# Patient Record
Sex: Female | Born: 1960 | Race: Black or African American | Hispanic: No | Marital: Single | Smoking: Never smoker
Health system: Southern US, Community
[De-identification: ages and names within clinical notes are randomized; demographics above are authoritative.]

## PROBLEM LIST (undated history)

## (undated) DIAGNOSIS — IMO0001 Reserved for inherently not codable concepts without codable children: Secondary | ICD-10-CM

## (undated) DIAGNOSIS — M109 Gout, unspecified: Secondary | ICD-10-CM

## (undated) DIAGNOSIS — B20 Human immunodeficiency virus [HIV] disease: Secondary | ICD-10-CM

## (undated) DIAGNOSIS — J069 Acute upper respiratory infection, unspecified: Secondary | ICD-10-CM

## (undated) DIAGNOSIS — B1920 Unspecified viral hepatitis C without hepatic coma: Secondary | ICD-10-CM

## (undated) DIAGNOSIS — I10 Essential (primary) hypertension: Secondary | ICD-10-CM

## (undated) DIAGNOSIS — L509 Urticaria, unspecified: Secondary | ICD-10-CM

## (undated) DIAGNOSIS — E785 Hyperlipidemia, unspecified: Secondary | ICD-10-CM

## (undated) DIAGNOSIS — T783XXA Angioneurotic edema, initial encounter: Secondary | ICD-10-CM

## (undated) DIAGNOSIS — B029 Zoster without complications: Secondary | ICD-10-CM

## (undated) DIAGNOSIS — D649 Anemia, unspecified: Secondary | ICD-10-CM

## (undated) DIAGNOSIS — Z8619 Personal history of other infectious and parasitic diseases: Secondary | ICD-10-CM

## (undated) DIAGNOSIS — Z21 Asymptomatic human immunodeficiency virus [HIV] infection status: Secondary | ICD-10-CM

## (undated) HISTORY — DX: Angioneurotic edema, initial encounter: T78.3XXA

## (undated) HISTORY — DX: Unspecified viral hepatitis C without hepatic coma: B19.20

## (undated) HISTORY — DX: Hyperlipidemia, unspecified: E78.5

## (undated) HISTORY — DX: Acute upper respiratory infection, unspecified: J06.9

## (undated) HISTORY — DX: Gout, unspecified: M10.9

## (undated) HISTORY — DX: Essential (primary) hypertension: I10

## (undated) HISTORY — DX: Urticaria, unspecified: L50.9

## (undated) HISTORY — DX: Personal history of other infectious and parasitic diseases: Z86.19

## (undated) HISTORY — DX: Reserved for inherently not codable concepts without codable children: IMO0001

---

## 1997-12-30 ENCOUNTER — Emergency Department (HOSPITAL_COMMUNITY): Admission: EM | Admit: 1997-12-30 | Discharge: 1997-12-30 | Payer: Self-pay | Admitting: Emergency Medicine

## 1998-08-05 ENCOUNTER — Encounter: Payer: Self-pay | Admitting: Emergency Medicine

## 1998-08-05 ENCOUNTER — Emergency Department (HOSPITAL_COMMUNITY): Admission: EM | Admit: 1998-08-05 | Discharge: 1998-08-05 | Payer: Self-pay | Admitting: *Deleted

## 1999-08-10 ENCOUNTER — Emergency Department (HOSPITAL_COMMUNITY): Admission: EM | Admit: 1999-08-10 | Discharge: 1999-08-10 | Payer: Self-pay | Admitting: Emergency Medicine

## 1999-08-10 ENCOUNTER — Encounter: Payer: Self-pay | Admitting: Emergency Medicine

## 2000-12-12 ENCOUNTER — Emergency Department (HOSPITAL_COMMUNITY): Admission: EM | Admit: 2000-12-12 | Discharge: 2000-12-12 | Payer: Self-pay | Admitting: Emergency Medicine

## 2002-04-14 ENCOUNTER — Emergency Department (HOSPITAL_COMMUNITY): Admission: EM | Admit: 2002-04-14 | Discharge: 2002-04-14 | Payer: Self-pay | Admitting: Emergency Medicine

## 2002-04-14 ENCOUNTER — Encounter: Payer: Self-pay | Admitting: Emergency Medicine

## 2002-07-17 ENCOUNTER — Emergency Department (HOSPITAL_COMMUNITY): Admission: EM | Admit: 2002-07-17 | Discharge: 2002-07-17 | Payer: Self-pay | Admitting: Emergency Medicine

## 2003-11-15 ENCOUNTER — Emergency Department (HOSPITAL_COMMUNITY): Admission: EM | Admit: 2003-11-15 | Discharge: 2003-11-15 | Payer: Self-pay | Admitting: Emergency Medicine

## 2006-05-14 ENCOUNTER — Emergency Department (HOSPITAL_COMMUNITY): Admission: EM | Admit: 2006-05-14 | Discharge: 2006-05-14 | Payer: Self-pay | Admitting: Emergency Medicine

## 2006-08-25 ENCOUNTER — Ambulatory Visit: Payer: Self-pay | Admitting: Family Medicine

## 2006-11-01 ENCOUNTER — Emergency Department (HOSPITAL_COMMUNITY): Admission: EM | Admit: 2006-11-01 | Discharge: 2006-11-01 | Payer: Self-pay | Admitting: Emergency Medicine

## 2007-01-29 ENCOUNTER — Ambulatory Visit: Payer: Self-pay | Admitting: Internal Medicine

## 2007-01-29 ENCOUNTER — Ambulatory Visit: Payer: Self-pay | Admitting: *Deleted

## 2007-02-02 ENCOUNTER — Emergency Department (HOSPITAL_COMMUNITY): Admission: EM | Admit: 2007-02-02 | Discharge: 2007-02-02 | Payer: Self-pay | Admitting: *Deleted

## 2007-02-10 ENCOUNTER — Emergency Department (HOSPITAL_COMMUNITY): Admission: EM | Admit: 2007-02-10 | Discharge: 2007-02-10 | Payer: Self-pay | Admitting: Emergency Medicine

## 2007-07-31 ENCOUNTER — Ambulatory Visit: Payer: Self-pay | Admitting: Internal Medicine

## 2007-08-25 ENCOUNTER — Ambulatory Visit: Payer: Self-pay | Admitting: Internal Medicine

## 2008-08-25 ENCOUNTER — Inpatient Hospital Stay (HOSPITAL_COMMUNITY): Admission: EM | Admit: 2008-08-25 | Discharge: 2008-08-29 | Payer: Self-pay | Admitting: Emergency Medicine

## 2008-09-26 ENCOUNTER — Ambulatory Visit: Payer: Self-pay | Admitting: Internal Medicine

## 2008-09-27 ENCOUNTER — Ambulatory Visit (HOSPITAL_COMMUNITY): Admission: RE | Admit: 2008-09-27 | Discharge: 2008-09-27 | Payer: Self-pay | Admitting: Internal Medicine

## 2008-09-29 ENCOUNTER — Ambulatory Visit (HOSPITAL_COMMUNITY): Admission: RE | Admit: 2008-09-29 | Discharge: 2008-09-29 | Payer: Self-pay | Admitting: Internal Medicine

## 2008-10-19 ENCOUNTER — Ambulatory Visit: Payer: Self-pay | Admitting: Internal Medicine

## 2008-10-20 ENCOUNTER — Encounter: Payer: Self-pay | Admitting: Internal Medicine

## 2008-10-20 ENCOUNTER — Ambulatory Visit: Payer: Self-pay | Admitting: Internal Medicine

## 2008-10-20 LAB — CONVERTED CEMR LAB
Chlamydia, Swab/Urine, PCR: NEGATIVE
GC Probe Amp, Urine: NEGATIVE

## 2008-10-21 ENCOUNTER — Ambulatory Visit: Payer: Self-pay | Admitting: Internal Medicine

## 2008-10-28 ENCOUNTER — Encounter (INDEPENDENT_AMBULATORY_CARE_PROVIDER_SITE_OTHER): Payer: Self-pay | Admitting: Adult Health

## 2008-10-28 ENCOUNTER — Ambulatory Visit: Payer: Self-pay | Admitting: Internal Medicine

## 2008-10-28 LAB — CONVERTED CEMR LAB
CD4 T Helper %: 29 % — ABNORMAL LOW (ref 32–62)
HIV 1 RNA Quant: 12700 copies/mL — ABNORMAL HIGH (ref <48)
HIV-1 RNA Quant, Log: 4.1 — ABNORMAL HIGH (ref <1.68)
Hep B S Ab: NEGATIVE
Hepatitis B Surface Ag: NEGATIVE
Total Lymphocytes %: 37 % (ref 12–46)
Total lymphocyte count: 1147 cells/mcL (ref 700–3300)

## 2008-11-03 ENCOUNTER — Encounter: Admission: RE | Admit: 2008-11-03 | Discharge: 2008-11-03 | Payer: Self-pay | Admitting: Internal Medicine

## 2008-11-14 ENCOUNTER — Encounter: Payer: Self-pay | Admitting: Internal Medicine

## 2008-11-14 ENCOUNTER — Ambulatory Visit: Payer: Self-pay | Admitting: Internal Medicine

## 2008-11-14 DIAGNOSIS — J309 Allergic rhinitis, unspecified: Secondary | ICD-10-CM | POA: Insufficient documentation

## 2008-11-14 DIAGNOSIS — B20 Human immunodeficiency virus [HIV] disease: Secondary | ICD-10-CM

## 2008-11-14 DIAGNOSIS — B1710 Acute hepatitis C without hepatic coma: Secondary | ICD-10-CM | POA: Insufficient documentation

## 2008-11-22 ENCOUNTER — Ambulatory Visit: Payer: Self-pay | Admitting: Internal Medicine

## 2008-11-25 ENCOUNTER — Encounter: Payer: Self-pay | Admitting: Internal Medicine

## 2008-12-08 ENCOUNTER — Encounter: Payer: Self-pay | Admitting: Internal Medicine

## 2008-12-23 ENCOUNTER — Encounter: Payer: Self-pay | Admitting: Internal Medicine

## 2009-03-06 ENCOUNTER — Ambulatory Visit: Payer: Self-pay | Admitting: Internal Medicine

## 2009-03-06 ENCOUNTER — Encounter: Payer: Self-pay | Admitting: Internal Medicine

## 2009-03-06 DIAGNOSIS — R3915 Urgency of urination: Secondary | ICD-10-CM | POA: Insufficient documentation

## 2009-03-06 LAB — CONVERTED CEMR LAB
ALT: 130 units/L — ABNORMAL HIGH (ref 0–35)
AST: 167 units/L — ABNORMAL HIGH (ref 0–37)
Albumin: 4.2 g/dL (ref 3.5–5.2)
Alkaline Phosphatase: 165 units/L — ABNORMAL HIGH (ref 39–117)
Blood in Urine, dipstick: NEGATIVE
CD4 T Helper %: 33 % (ref 32–62)
Calcium: 9.3 mg/dL (ref 8.4–10.5)
Chloride: 103 meq/L (ref 96–112)
Lymphocytes Relative: 43 % (ref 12–46)
Lymphs Abs: 1.2 10*3/uL (ref 0.7–4.0)
Neutro Abs: 1.1 10*3/uL — ABNORMAL LOW (ref 1.7–7.7)
Neutrophils Relative %: 40 % — ABNORMAL LOW (ref 43–77)
Platelets: 214 10*3/uL (ref 150–400)
Potassium: 4.5 meq/L (ref 3.5–5.3)
Specific Gravity, Urine: 1.025
WBC: 2.7 10*3/uL — ABNORMAL LOW (ref 4.0–10.5)
pH: 5

## 2009-03-23 ENCOUNTER — Encounter: Payer: Self-pay | Admitting: Internal Medicine

## 2009-03-23 ENCOUNTER — Ambulatory Visit: Payer: Self-pay | Admitting: Internal Medicine

## 2009-03-23 DIAGNOSIS — R21 Rash and other nonspecific skin eruption: Secondary | ICD-10-CM | POA: Insufficient documentation

## 2009-06-05 ENCOUNTER — Encounter: Payer: Self-pay | Admitting: Internal Medicine

## 2009-06-05 ENCOUNTER — Ambulatory Visit: Payer: Self-pay | Admitting: Internal Medicine

## 2009-06-05 LAB — CONVERTED CEMR LAB
ALT: 30 units/L (ref 0–35)
Albumin: 4.7 g/dL (ref 3.5–5.2)
Basophils Relative: 0 % (ref 0–1)
CO2: 21 meq/L (ref 19–32)
Calcium: 9.3 mg/dL (ref 8.4–10.5)
Chloride: 104 meq/L (ref 96–112)
Glucose, Bld: 96 mg/dL (ref 70–99)
HIV 1 RNA Quant: <48 copies/mL (ref <48)
HIV-1 RNA Quant, Log: <1.68 (ref <1.68)
Hemoglobin: 11.9 g/dL — ABNORMAL LOW (ref 12.0–15.0)
Lymphs Abs: 0.9 10*3/uL (ref 0.7–4.0)
MCHC: 32.2 g/dL (ref 30.0–36.0)
MCV: 98.1 fL (ref 78.0–100.0)
Monocytes Absolute: 0.4 10*3/uL (ref 0.1–1.0)
Monocytes Relative: 9 % (ref 3–12)
Neutro Abs: 2.5 10*3/uL (ref 1.7–7.7)
Potassium: 3.9 meq/L (ref 3.5–5.3)
RBC: 3.77 M/uL — ABNORMAL LOW (ref 3.87–5.11)
Sodium: 137 meq/L (ref 135–145)
Total Bilirubin: 0.2 mg/dL — ABNORMAL LOW (ref 0.3–1.2)
Total Protein: 8.2 g/dL (ref 6.0–8.3)
WBC: 3.8 10*3/uL — ABNORMAL LOW (ref 4.0–10.5)

## 2009-06-09 ENCOUNTER — Telehealth: Payer: Self-pay | Admitting: Internal Medicine

## 2009-06-19 ENCOUNTER — Encounter: Payer: Self-pay | Admitting: Internal Medicine

## 2009-06-19 ENCOUNTER — Ambulatory Visit: Payer: Self-pay | Admitting: Internal Medicine

## 2009-06-19 DIAGNOSIS — J019 Acute sinusitis, unspecified: Secondary | ICD-10-CM | POA: Insufficient documentation

## 2009-09-19 ENCOUNTER — Encounter (INDEPENDENT_AMBULATORY_CARE_PROVIDER_SITE_OTHER): Payer: Self-pay | Admitting: *Deleted

## 2009-09-19 ENCOUNTER — Ambulatory Visit: Payer: Self-pay | Admitting: Internal Medicine

## 2009-09-19 LAB — CONVERTED CEMR LAB
ALT: 83 units/L — ABNORMAL HIGH (ref 0–35)
BUN: 6 mg/dL (ref 6–23)
Basophils Absolute: 0 10*3/uL (ref 0.0–0.1)
Cholesterol: 220 mg/dL — ABNORMAL HIGH (ref 0–200)
Creatinine, Ser: 0.72 mg/dL (ref 0.40–1.20)
Eosinophils Absolute: 0 10*3/uL (ref 0.0–0.7)
Eosinophils Relative: 0 % (ref 0–5)
Glucose, Bld: 93 mg/dL (ref 70–99)
HCT: 34.5 % — ABNORMAL LOW (ref 36.0–46.0)
HIV 1 RNA Quant: <48 copies/mL (ref <48)
HIV-1 RNA Quant, Log: <1.68 (ref <1.68)
Hemoglobin: 11.5 g/dL — ABNORMAL LOW (ref 12.0–15.0)
Lymphocytes Relative: 45 % (ref 12–46)
MCHC: 33.3 g/dL (ref 30.0–36.0)
MCV: 93.2 fL (ref 78.0–100.0)
Monocytes Absolute: 0.4 10*3/uL (ref 0.1–1.0)
Platelets: 148 10*3/uL — ABNORMAL LOW (ref 150–400)
Potassium: 3.5 meq/L (ref 3.5–5.3)
RDW: 14.6 % (ref 11.5–15.5)
Total Bilirubin: 0.4 mg/dL (ref 0.3–1.2)
Total Protein: 8.2 g/dL (ref 6.0–8.3)
VLDL: 29 mg/dL (ref 0–40)

## 2009-10-04 ENCOUNTER — Ambulatory Visit: Payer: Self-pay | Admitting: Internal Medicine

## 2009-10-04 DIAGNOSIS — E785 Hyperlipidemia, unspecified: Secondary | ICD-10-CM | POA: Insufficient documentation

## 2009-10-19 ENCOUNTER — Encounter (INDEPENDENT_AMBULATORY_CARE_PROVIDER_SITE_OTHER): Payer: Self-pay | Admitting: *Deleted

## 2009-10-20 ENCOUNTER — Ambulatory Visit: Payer: Self-pay | Admitting: Internal Medicine

## 2009-10-20 ENCOUNTER — Telehealth: Payer: Self-pay | Admitting: Internal Medicine

## 2009-10-20 LAB — CONVERTED CEMR LAB

## 2009-10-31 ENCOUNTER — Encounter: Payer: Self-pay | Admitting: Internal Medicine

## 2009-11-02 ENCOUNTER — Telehealth: Payer: Self-pay | Admitting: Internal Medicine

## 2009-12-27 ENCOUNTER — Encounter: Payer: Self-pay | Admitting: Internal Medicine

## 2010-01-29 ENCOUNTER — Ambulatory Visit: Payer: Self-pay | Admitting: Internal Medicine

## 2010-01-29 LAB — CONVERTED CEMR LAB
HIV 1 RNA Quant: <48 copies/mL (ref <48)
HIV-1 RNA Quant, Log: <1.68 (ref <1.68)

## 2010-01-30 LAB — CONVERTED CEMR LAB
ALT: 31 units/L (ref 0–35)
Basophils Absolute: 0 10*3/uL (ref 0.0–0.1)
CO2: 21 meq/L (ref 19–32)
Calcium: 9 mg/dL (ref 8.4–10.5)
Chloride: 103 meq/L (ref 96–112)
Cholesterol: 221 mg/dL — ABNORMAL HIGH (ref 0–200)
Hemoglobin: 12.4 g/dL (ref 12.0–15.0)
Lymphocytes Relative: 19 % (ref 12–46)
Monocytes Absolute: 0.6 10*3/uL (ref 0.1–1.0)
Neutro Abs: 3.4 10*3/uL (ref 1.7–7.7)
Neutrophils Relative %: 69 % (ref 43–77)
Platelets: 226 10*3/uL (ref 150–400)
Potassium: 4.1 meq/L (ref 3.5–5.3)
RDW: 13.5 % (ref 11.5–15.5)
Sodium: 135 meq/L (ref 135–145)
Total Protein: 7.9 g/dL (ref 6.0–8.3)

## 2010-04-18 ENCOUNTER — Ambulatory Visit: Payer: Self-pay | Admitting: Internal Medicine

## 2010-04-18 LAB — CONVERTED CEMR LAB
ALT: 47 units/L — ABNORMAL HIGH (ref 0–35)
AST: 79 units/L — ABNORMAL HIGH (ref 0–37)
Basophils Absolute: 0 10*3/uL (ref 0.0–0.1)
CO2: 24 meq/L (ref 19–32)
Calcium: 8.6 mg/dL (ref 8.4–10.5)
Chloride: 108 meq/L (ref 96–112)
Eosinophils Absolute: 0 10*3/uL (ref 0.0–0.7)
Eosinophils Relative: 1 % (ref 0–5)
HIV 1 RNA Quant: <20 copies/mL (ref <20)
Lymphs Abs: 1.2 10*3/uL (ref 0.7–4.0)
MCV: 98.9 fL (ref 78.0–100.0)
Neutrophils Relative %: 47 % (ref 43–77)
Platelets: 230 10*3/uL (ref 150–400)
RDW: 14.7 % (ref 11.5–15.5)
Sodium: 142 meq/L (ref 135–145)
Total Bilirubin: 0.3 mg/dL (ref 0.3–1.2)
Total Protein: 7.8 g/dL (ref 6.0–8.3)
WBC: 3 10*3/uL — ABNORMAL LOW (ref 4.0–10.5)

## 2010-05-09 ENCOUNTER — Ambulatory Visit: Payer: Self-pay | Admitting: Internal Medicine

## 2010-06-24 ENCOUNTER — Encounter: Payer: Self-pay | Admitting: Internal Medicine

## 2010-07-03 NOTE — Miscellaneous (Signed)
Summary: Office Visit ([facility])    Vital Signs:  Patient profile:   50 year old female Menstrual status:  regular Weight:      181.04 pounds (82.29 kg) Temp:     98.4 degrees F (36.89 degrees C) oral Pulse rate:   97 / minute Resp:     20 per minute BP sitting:   113 / 77  (left arm) Cuff size:   large  Vitals Entered By: Hoy Morn CMA (June 19, 2009 9:14 AM) CC: pt.presents fu 05//ckf Is Patient Diabetic? No Pain Assessment Patient in pain? no       Does patient need assistance? Functional Status Self care Ambulation Normal   CC:  pt.presents fu 05//ckf.  History of Present Illness: Pt here for f/u.  She continues to have a sore throat and left ear pain.  No fever.  She took all of her amoxicillin. No missed doses of her Atripla.  Current Problems (verified): 1)  Skin Rash  (ICD-782.1) 2)  Urgency of Urination  (YNW-295.62) 3)  Allergic Rhinitis  (ICD-477.9) 4)  HIV Disease  (ICD-042) 5)  Hepatitis C  (ICD-070.51)  Current Medications (verified): 1)  Atripla 600-200-300 Mg Tabs (Efavirenz-Emtricitab-Tenofovir) .... Take 1 Tablet By Mouth At Bedtime 2)  Nasacort Aq 55 Mcg/act Aers (Triamcinolone Acetonide(Nasal)) .... One Spray Two Times A Day 3)  Triamcinolone Acetonide 0.1 % Crea (Triamcinolone Acetonide) .... Apply Twice A Day As Needed 4)  Singulair 10 Mg Tabs (Montelukast Sodium) .... .qdtqab 5)  Ciprofloxacin Hcl 500 Mg Tabs (Ciprofloxacin Hcl) .... Take 1 Tablet By Mouth Two Times A Day  Allergies: No Known Drug Allergies   Social History: Tobacco Use:  yes  Review of Systems  The patient denies anorexia, fever, weight loss, prolonged cough, and hemoptysis.     Physical Exam  General:  alert, well-developed, well-nourished, and well-hydrated.   Head:  normocephalic and atraumatic.   Ears:  left TM dull no erythema Mouth:  no exudates and pharyngeal erythema.   Lungs:  normal breath sounds.      Impression &  Recommendations:  Problem # 1:  HIV DISEASE (ICD-042) Pt.s most recent CD4ct was 328 and VL <48 .  Pt instructed to continue the current antiretroviral regimen.  Pt encouraged to take medication regularly and not miss doses.  Pt will f/u in 3 months for repeat blood work and will see me 2 weeks later.  Diagnostics Reviewed:  HIV: HIV positive - not AIDS (11/14/2008)   WBC: 3.8 (06/05/2009)   Hgb: 11.9 (06/05/2009)   HCT: 37.0 (06/05/2009)   Platelets: 205 (06/05/2009) HIV genotype: See Comment (10/28/2008)   HIV-1 RNA: <48 copies/mL (06/05/2009)   HBSAg: NEG (10/28/2008)  Problem # 2:  ACUTE SINUSITIS, UNSPECIFIED (ICD-461.9) trial of cipro Her updated medication list for this problem includes:    Nasacort Aq 55 Mcg/act Aers (Triamcinolone acetonide(nasal)) ..... One spray two times a day    Ciprofloxacin Hcl 500 Mg Tabs (Ciprofloxacin hcl) .Marland Kitchen... Take 1 tablet by mouth two times a day  Complete Medication List: 1)  Atripla 600-200-300 Mg Tabs (Efavirenz-emtricitab-tenofovir) .... Take 1 tablet by mouth at bedtime 2)  Nasacort Aq 55 Mcg/act Aers (Triamcinolone acetonide(nasal)) .... One spray two times a day 3)  Triamcinolone Acetonide 0.1 % Crea (Triamcinolone acetonide) .... Apply twice a day as needed 4)  Singulair 10 Mg Tabs (Montelukast sodium) .... .qdtqab 5)  Ciprofloxacin Hcl 500 Mg Tabs (Ciprofloxacin hcl) .... Take 1 tablet by mouth two times a day  Other Orders: Est. Patient Level III ([address]) Future Orders: T-CD4SP ([facility]) (CD4SP) ... 09/17/2009 T-HIV Viral Load [phone]) ... 09/17/2009 T-Comprehensive Metabolic Panel [phone]) ... 09/17/2009 T-CBC w/Diff ([id number]) ... 09/17/2009 T-Lipid Profile [phone]) ... 09/17/2009    Patient Instructions: 1)  Please schedule a follow-up appointment in 3 months, 2 weeks after labs. Prescriptions: CIPROFLOXACIN HCL 500 MG TABS (CIPROFLOXACIN HCL) Take 1 tablet by mouth two times a day  #20 x 0   Entered and  Authorized by:   Yisroel Ramming MD   Signed by:   Yisroel Ramming MD on 06/19/2009   Method used:   Print then Give to Patient   RxID:   [id number]

## 2010-07-03 NOTE — Miscellaneous (Signed)
  Clinical Lists Changes  Medications: Added new medication of CLARITIN 10 MG TABS (LORATADINE) Take 1 tablet by mouth once a day - Signed Rx of CLARITIN 10 MG TABS (LORATADINE) Take 1 tablet by mouth once a day;  #30 x 5;  Signed;  Entered by: Yisroel Ramming MD;  Authorized by: Yisroel Ramming MD;  Method used: Print then Give to Patient    Prescriptions: CLARITIN 10 MG TABS (LORATADINE) Take 1 tablet by mouth once a day  #30 x 5   Entered and Authorized by:   Yisroel Ramming MD   Signed by:   Yisroel Ramming MD on 01/29/2010   Method used:   Print then Give to Patient   RxID:   [id number]

## 2010-07-03 NOTE — Assessment & Plan Note (Signed)
Summary: F/U/VS   CC:  follow-up visit, missed labs, and refill Singulair.  History of Present Illness: Pt c/o  of allergy symptoms - would like a refill of her singulair. No missed doses ofher Atripla.  Preventive Screening-Counseling & Management  Alcohol-Tobacco     Alcohol drinks/day: occasional     Smoking Status: never     Passive Smoke Exposure: No  Caffeine-Diet-Exercise     Caffeine use/day: one soda per day     Does Patient Exercise: yes     Type of exercise: walks     Exercise (avg: min/session): 30-60     Times/week: 5  Safety-Violence-Falls     Seat Belt Use: yes      Sexual History:  n/a.        Drug Use:  No.    Comments: pt. given condoms   Updated Prior Medication List: ATRIPLA 600-200-300 MG TABS (EFAVIRENZ-EMTRICITAB-TENOFOVIR) Take 1 tablet by mouth at bedtime NASACORT AQ 55 MCG/ACT AERS (TRIAMCINOLONE ACETONIDE(NASAL)) one spray two times a day TRIAMCINOLONE ACETONIDE 0.1 % CREA (TRIAMCINOLONE ACETONIDE) apply twice a day as needed SINGULAIR 10 MG TABS (MONTELUKAST SODIUM) Take 1 tablet by mouth once a day  Current Allergies (reviewed today): No known allergies  Past History:  Past Medical History: Last updated: 11/14/2008 Hepatitis C HIV disease Allergic rhinitis  Review of Systems  The patient denies anorexia, fever, weight loss, dyspnea on exertion, and prolonged cough.    Vital Signs:  Patient profile:   50 year old female Menstrual status:  regular Height:      67 inches (170.18 cm) Weight:      163.8 pounds (74.45 kg) BMI:     25.75 Temp:     98.0 degrees F (36.67 degrees C) oral Pulse rate:   91 / minute BP sitting:   135 / 88  (right arm)  Vitals Entered By: Wendall Mola CMA Duncan Dull) (January 29, 2010 9:00 AM) CC: follow-up visit, missed labs, refill Singulair Is Patient Diabetic? No Pain Assessment Patient in pain? no      Nutritional Status BMI of 25 - 29 = overweight Nutritional Status Detail  appetite "so-so"  Have you ever been in a relationship where you felt threatened, hurt or afraid?No   Does patient need assistance? Functional Status Self care Ambulation Normal Comments no missed doses of Atripla per patient   Physical Exam  General:  alert, well-developed, well-nourished, and well-hydrated.   Head:  normocephalic and atraumatic.   Mouth:  pharynx pink and moist.   Lungs:  normal breath sounds.      Impression & Recommendations:  Problem # 1:  HIV DISEASE (ICD-042) Will obtain labs today.  Pt to continue her Atripla. F/u in 3months. Diagnostics Reviewed:  HIV: HIV positive - not AIDS (11/14/2008)   CD4: 340 (09/19/2009)   WBC: 2.6 (09/19/2009)   Hgb: 11.5 (09/19/2009)   HCT: 34.5 (09/19/2009)   Platelets: 148 (09/19/2009) HIV genotype: See Comment (10/28/2008)   HIV-1 RNA: <48 copies/mL (09/19/2009)   HBSAg: NEG (10/28/2008)  Problem # 2:  ALLERGIC RHINITIS (ICD-477.9) refill singulair Her updated medication list for this problem includes:    Nasacort Aq 55 Mcg/act Aers (Triamcinolone acetonide(nasal)) ..... One spray two times a day  Other Orders: Est. Patient Level III ([address]) Future Orders: T-CD4SP ([facility]) (CD4SP) ... 04/29/2010 T-HIV Viral Load [phone]) ... 04/29/2010 T-Comprehensive Metabolic Panel [phone]) ... 04/29/2010 T-CBC w/Diff ([id number]) ... 04/29/2010 T-RPR (Syphilis) [phone]) ... 04/29/2010  Patient Instructions: 1)  Please schedule  a follow-up appointment in 3 months, 2 weeks after labs.  Prescriptions: SINGULAIR 10 MG TABS (MONTELUKAST SODIUM) Take 1 tablet by mouth once a day  #90 x 0   Entered and Authorized by:   Yisroel Ramming MD   Signed by:   Yisroel Ramming MD on 01/29/2010   Method used:   Print then Give to Patient   RxID:   [id number]   Appended Document: Orders Update    Clinical Lists Changes  Orders: Added new Service order of Influenza Vaccine NON MCR (86578) -  Signed Observations: Added new observation of FLU VAX#1VIS: 12/25/06 version given January 29, 2010. (01/29/2010 10:38) Added new observation of FLU VAXLOT: 1103 3P (01/29/2010 10:38) Added new observation of FLU VAX EXP: 09/02/2010 (01/29/2010 10:38) Added new observation of FLU VAXBY: Wendall Mola CMA ( AAMA) (01/29/2010 10:38) Added new observation of FLU VAXRTE: IM (01/29/2010 10:38) Added new observation of FLU VAX DSE: 0.5 ml (01/29/2010 10:38) Added new observation of FLU VAXMFR: Novartis (01/29/2010 10:38) Added new observation of FLU VAX SITE: right deltoid (01/29/2010 10:38) Added new observation of FLU VAX: Fluvax Non-MCR (01/29/2010 10:38)       Immunizations Administered:  Influenza Vaccine # 1:    Vaccine Type: Fluvax Non-MCR    Site: right deltoid    Mfr: Novartis    Dose: 0.5 ml    Route: IM    Given by: Wendall Mola CMA ( AAMA)    Exp. Date: 09/02/2010    Lot #: [id number]    VIS given: 12/25/06 version given January 29, 2010.  Flu Vaccine Consent Questions:    Do you have a history of severe allergic reactions to this vaccine? no    Any prior history of allergic reactions to egg and/or gelatin? no    Do you have a sensitivity to the preservative Thimersol? no    Do you have a past history of Guillan-Barre Syndrome? no    Do you currently have an acute febrile illness? no    Have you ever had a severe reaction to latex? no    Vaccine information given and explained to patient? yes    Are you currently pregnant? no

## 2010-07-03 NOTE — Miscellaneous (Signed)
Summary: Problem List update  Clinical Lists Changes  Problems: Added new problem of SCREENING FOR MALIGNANT NEOPLASM OF THE CERVIX (ICD-V76.2) 

## 2010-07-03 NOTE — Progress Notes (Signed)
Summary: Pt assist med arrived 3 mos supply nxt reorder 01/04/10  Phone Note Refill Request      Prescriptions: NASACORT AQ 55 MCG/ACT AERS (TRIAMCINOLONE ACETONIDE(NASAL)) one spray two times a day  #3 btls x 0   Entered by:   Paulo Fruit  BS,CPht II,MPH   Authorized by:   Yisroel Ramming MD   Signed by:   Paulo Fruit  BS,CPht II,MPH on 10/20/2009   Method used:   Samples Given   RxID:   [id number]   Patient Assist Medication Verification: Medication: Nasacort AQ Lot# [id number] Exp Date:06 2012 Tech approval:MLD  Next reorder due August 4. Medication arrived via PAP for a 3 months supply Call placed to patient with message that assistance medications are ready for pick-up. patient said she will pick up on Monday. Paulo Fruit  BS,CPht II,MPH  Oct 20, 2009 2:57 PM

## 2010-07-03 NOTE — Assessment & Plan Note (Signed)
Summary: hse pt. f/u [mkj]   CC:  follow-up visit, lab results, c/o allergies, and needs refills on meds.  History of Present Illness: Pt c/o allergies. She ran out of her medication. She has been taking her Atripla every day.  Preventive Screening-Counseling & Management  Alcohol-Tobacco     Alcohol drinks/day: occasional     Smoking Status: never     Passive Smoke Exposure: No  Caffeine-Diet-Exercise     Caffeine use/day: one soda per day     Does Patient Exercise: yes     Type of exercise: walks     Exercise (avg: min/session): 30-60     Times/week: 5  Safety-Violence-Falls     Seat Belt Use: yes      Sexual History:  n/a.        Drug Use:  No.     Updated Prior Medication List: ATRIPLA 600-200-300 MG TABS (EFAVIRENZ-EMTRICITAB-TENOFOVIR) Take 1 tablet by mouth at bedtime NASACORT AQ 55 MCG/ACT AERS (TRIAMCINOLONE ACETONIDE(NASAL)) one spray two times a day TRIAMCINOLONE ACETONIDE 0.1 % CREA (TRIAMCINOLONE ACETONIDE) apply twice a day as needed SINGULAIR 10 MG TABS (MONTELUKAST SODIUM) .qdtqab  Current Allergies (reviewed today): No known allergies  Past History:  Past Medical History: Last updated: 11/14/2008 Hepatitis C HIV disease Allergic rhinitis  Social History: Sexual History:  n/a  Review of Systems  The patient denies anorexia, fever, and weight loss.    Vital Signs:  Patient profile:   50 year old female Menstrual status:  regular Height:      67 inches (170.18 cm) Weight:      170.8 pounds (77.64 kg) BMI:     26.85 Temp:     98.0 degrees F (36.67 degrees C) oral Pulse rate:   86 / minute BP sitting:   129 / 84  (right arm)  Vitals Entered By: Wendall Mola CMA Duncan Dull) (Oct 04, 2009 9:44 AM) CC: follow-up visit, lab results, c/o allergies, needs refills on meds Is Patient Diabetic? No Pain Assessment Patient in pain? no      Nutritional Status BMI of 25 - 29 = overweight Nutritional Status Detail appetite "good"  Have you  ever been in a relationship where you felt threatened, hurt or afraid?No   Does patient need assistance? Functional Status Self care Ambulation Normal Comments no missed doses of meds per patient   Physical Exam  General:  alert, well-developed, well-nourished, and well-hydrated.   Head:  normocephalic and atraumatic.   Mouth:  pharynx pink and moist.   Lungs:  normal breath sounds.      Impression & Recommendations:  Problem # 1:  HIV DISEASE (ICD-042) Pt.s most recent CD4ct was 340 and VL <48 .  Pt instructed to continue the current antiretroviral regimen.  Pt encouraged to take medication regularly and not miss doses.  Pt will f/u in 3 months for repeat blood work and will see me 2 weeks later. Third Hep B vaccine given.  Will schedule for a PAP.  Diagnostics Reviewed:  HIV: HIV positive - not AIDS (11/14/2008)   CD4: 340 (09/19/2009)   WBC: 2.6 (09/19/2009)   Hgb: 11.5 (09/19/2009)   HCT: 34.5 (09/19/2009)   Platelets: 148 (09/19/2009) HIV genotype: See Comment (10/28/2008)   HIV-1 RNA: <48 copies/mL (09/19/2009)   HBSAg: NEG (10/28/2008)  Problem # 2:  ALLERGIC RHINITIS (ICD-477.9) refill singulair and nasocort Her updated medication list for this problem includes:    Nasacort Aq 55 Mcg/act Aers (Triamcinolone acetonide(nasal)) ..... One spray two  times a day  Problem # 3:  HYPERLIPIDEMIA (ICD-272.4) discussed diet and exercise  Other Orders: Est. Patient Level III (69629) Hepatitis B Vaccine >54yrs (52841) Admin 1st Vaccine (32440) Future Orders: T-CD4SP ([facility]) (CD4SP) ... 01/02/2010 T-HIV Viral Load [phone]) ... 01/02/2010 T-Comprehensive Metabolic Panel [phone]) ... 01/02/2010 T-CBC w/Diff ([id number]) ... 01/02/2010 T-Lipid Profile [id number]) ... 01/02/2010  Patient Instructions: 1)  Please schedule a follow-up appointment in 3 months, 2 weeks after labs. 2)  Schedule in PAP clinic  Prescriptions: SINGULAIR 10 MG TABS (MONTELUKAST SODIUM)  .qdtqab  #30 x 5   Entered and Authorized by:   Yisroel Ramming MD   Signed by:   Yisroel Ramming MD on 10/04/2009   Method used:   Print then Give to Patient   RxID:   [id number] NASACORT AQ 55 MCG/ACT AERS (TRIAMCINOLONE ACETONIDE(NASAL)) one spray two times a day  #1 x 5   Entered and Authorized by:   Yisroel Ramming MD   Signed by:   Yisroel Ramming MD on 10/04/2009   Method used:   Print then Give to Patient   RxID:   [id number]    Immunizations Administered:  Hepatitis B Vaccine # 3:    Vaccine Type: HepB Adult    Site: right deltoid    Mfr: Merck    Dose: 0.5 ml    Route: IM    Given by: Wendall Mola CMA ( AAMA)    Exp. Date: 03/24/2012    Lot #: [id number]    VIS given: 12/18/05 version given Oct 04, 2009.

## 2010-07-03 NOTE — Progress Notes (Signed)
Summary: med refill  Phone Note Refill Request Message from:  Fax from Pharmacy on June 09, 2009 2:56 PM  Refills Requested: Medication #1:  ATRIPLA 600-200-300 MG TABS Take 1 tablet by mouth at bedtime   Dosage confirmed as above?Dosage Confirmed   Brand Name Necessary? No   Supply Requested: 1 month   Last Refilled: 05/10/2009  Method Requested: Telephone to Pharmacy Next Appointment Scheduled: seen 06/05/09. Initial call taken by: Sharen Heck RN,  June 09, 2009 2:57 PM    Prescriptions: ATRIPLA 600-200-300 MG TABS (EFAVIRENZ-EMTRICITAB-TENOFOVIR) Take 1 tablet by mouth at bedtime  #30 x 5   Entered by:   Sharen Heck RN   Authorized by:   Yisroel Ramming MD   Signed by:   Yisroel Ramming MD on 06/09/2009   Method used:   Telephoned to ...       [facility] (retail)       [address]       [place], [place]  [address]       Ph: [phone]       Fax: [phone]   RxID:   [id number]

## 2010-07-03 NOTE — Miscellaneous (Signed)
Summary: HIPAA Restrictions  HIPAA Restrictions   Imported By: Florinda Marker 09/19/2009 15:18:31  _____________________________________________________________________  External Attachment:    Type:   Image     Comment:   External Document

## 2010-07-03 NOTE — Letter (Signed)
Summary: Results Follow-up Letter  [facility]  [address]   [place], [place] [address]   Phone: [phone]  Fax: [phone]          Oct 31, 2009  [address] [place], [place]  [address]  Dear Ms. Barrales,   The following are the results of your recent test(s):  Test     Result     Pap Smear    Normal_XXX___  Not Normal_____       Comments:  I will see you in one year for your next PAP smear.  Thank you for coming to the Center.  Sincerely,    Jennet Maduro [facility]

## 2010-07-03 NOTE — Miscellaneous (Signed)
Summary: Office Visit ([facility])    Vital Signs:  Patient profile:   50 year old female Menstrual status:  regular Weight:      173 pounds Temp:     98.3 degrees F oral Pulse rate:   87 / minute Pulse rhythm:   regular Resp:     17 per minute BP sitting:   120 / 78  (left arm)  Vitals Entered By: Sharen Heck RN (June 05, 2009 8:53 AM) CC: f/u 05, right sided face and left ear pain, cough, migraines daily Pain Assessment Patient in pain? yes     Location: facial Intensity: 9 Type: sharp  Does patient need assistance? Functional Status Self care Ambulation Normal   CC:  f/u 05, right sided face and left ear pain, cough, and migraines daily.  History of Present Illness: Pt c/o allergies with sinus pressure and sore throat. She had a fever a couple of days ago.  She also has swollen glands on the left. She has been taking her Atripla every day.  Preventive Screening-Counseling & Management  Alcohol-Tobacco     Alcohol drinks/day: daily     Smoking Status: never     Passive Smoke Exposure: No  Caffeine-Diet-Exercise     Caffeine use/day: 0     Does Patient Exercise: yes     Type of exercise: walks     Exercise (avg: min/session): 30-60     Times/week: 5  Current Problems (verified): 1)  Skin Rash  (ICD-782.1) 2)  Urgency of Urination  (EAV-409.81) 3)  Allergic Rhinitis  (ICD-477.9) 4)  HIV Disease  (ICD-042) 5)  Hepatitis C  (ICD-070.51)  Current Medications (verified): 1)  Atripla 600-200-300 Mg Tabs (Efavirenz-Emtricitab-Tenofovir) .... Take 1 Tablet By Mouth At Bedtime 2)  Nasacort Aq 55 Mcg/act Aers (Triamcinolone Acetonide(Nasal)) .... One Spray Two Times A Day 3)  Triamcinolone Acetonide 0.1 % Crea (Triamcinolone Acetonide) .... Apply Twice A Day As Needed 4)  Singulair 10 Mg Tabs (Montelukast Sodium) .... .qdtqab 5)  Amoxicillin 500 Mg Caps (Amoxicillin) .... Take 1 Capsule By Mouth Three Times A Day  Allergies (verified): No Known Drug  Allergies   Review of Systems  The patient denies anorexia, prolonged cough, and hemoptysis.     Physical Exam  General:  alert, well-developed, well-nourished, and well-hydrated.   Head:  normocephalic and atraumatic.   Mouth:  no exudates and pharyngeal erythema.   Neck:  cervical lymphadenopathy.   Lungs:  normal breath sounds.      Impression & Recommendations:  Problem # 1:  HIV DISEASE (ICD-042) Will obtain labs today.  Pt to continue current meds and f/u in 2 weeks. Hep B vaccine #2 given today. Diagnostics Reviewed:  HIV: HIV positive - not AIDS (11/14/2008)   WBC: 2.7 (03/06/2009)   Hgb: 11.3 (03/06/2009)   HCT: 34.3 (03/06/2009)   Platelets: 214 (03/06/2009) HIV genotype: See Comment (10/28/2008)   HIV-1 RNA: <48 copies/mL (03/06/2009)   HBSAg: NEG (10/28/2008)  Her updated medication list for this problem includes:    Amoxicillin 500 Mg Caps (Amoxicillin) .Marland Kitchen... Take 1 capsule by mouth three times a day  Problem # 2:  ALLERGIC RHINITIS (ICD-477.9) trial of sinulair. Her updated medication list for this problem includes:    Nasacort Aq 55 Mcg/act Aers (Triamcinolone acetonide(nasal)) ..... One spray two times a day  Medications Added to Medication List This Visit: 1)  Singulair 10 Mg Tabs (Montelukast sodium) .... .qdtqab 2)  Amoxicillin 500 Mg Caps (Amoxicillin) .... Take  1 capsule by mouth three times a day  Other Orders: T-CD4SP [facility]) (CD4SP) T-HIV Viral Load [phone]) T-Comprehensive Metabolic Panel [phone]) T-CBC w/Diff ([id number]) Est. Patient Level III (01027) Hepatitis B Vaccine ADOLESCENT (2 dose) (25366) Admin 1st Vaccine [phone]) Admin 1st Vaccine [facility]) [phone])    Patient Instructions: 1)  Please schedule a follow-up appointment in 2 weeks. Prescriptions: AMOXICILLIN 500 MG CAPS (AMOXICILLIN) Take 1 capsule by mouth three times a day  #30 x 0   Entered and Authorized by:   Yisroel Ramming MD   Signed by:   Yisroel Ramming MD on  06/05/2009   Method used:   Print then Give to Patient   RxID:   [id number] SINGULAIR 10 MG TABS (MONTELUKAST SODIUM) .qdtqab  #30 x 5   Entered and Authorized by:   Yisroel Ramming MD   Signed by:   Yisroel Ramming MD on 06/05/2009   Method used:   Print then Give to Patient   RxID:   [id number]    Hepatitis B Immunization History:    Hep B # 2:  HepB Adolescent (06/05/2009)  Hepatitis B Vaccine # 2    Vaccine Type: HepB Adolescent    Site: left deltoid    Mfr: GlaxoSmithKline    Dose: 0.5 ml    Route: IM    Given by: Sharen Heck RN    Lot #: [id number]    VIS given: 12/18/05 version given June 05, 2009.

## 2010-07-03 NOTE — Miscellaneous (Signed)
Summary: clinical update/ryan white  Clinical Lists Changes  Observations: Added new observation of PAYOR: Medicaid (09/19/2009 9:43) Added new observation of AIDSDAP: Yes 2011 (09/19/2009 9:43) Added new observation of FINASSESSDT: 09/19/2009 (09/19/2009 9:43) Added new observation of REC_MESSAGE: Yes (09/19/2009 9:43) Added new observation of RECPHONECALL: Yes (09/19/2009 9:43) Added new observation of REC_MAIL: Yes (09/19/2009 9:43)  Appended Document: clinical update/ryan white the Medicaid only covers Family Planning not medications.

## 2010-07-03 NOTE — Assessment & Plan Note (Signed)
Summary: PAP smear visit   Vital Signs:  Patient profile:   50 year old female Menstrual status:  regular LMP:     10/09/2009  Vitals Entered By: Jennet Maduro RN (Oct 20, 2009 8:51 AM) CC: PAP smear visit.  Pt. given condoms.  Pt. given educational materials re:   HIV and women, BSE, nutrition, diet, exercise and self-esteem.   Pt. given information about mammograms.  LMP (date): 10/09/2009 LMP - Character: normal     Menstrual Status regular Enter LMP: 10/09/2009   Evaluation and Follow-Up  Prevention For Positives: 10/20/2009   Safe sex practices discussed with patient. Condoms offered. Prior Medications: ATRIPLA 600-200-300 MG TABS (EFAVIRENZ-EMTRICITAB-TENOFOVIR) Take 1 tablet by mouth at bedtime NASACORT AQ 55 MCG/ACT AERS (TRIAMCINOLONE ACETONIDE(NASAL)) one spray two times a day TRIAMCINOLONE ACETONIDE 0.1 % CREA (TRIAMCINOLONE ACETONIDE) apply twice a day as needed SINGULAIR 10 MG TABS (MONTELUKAST SODIUM) .qdtqab Current Allergies: No known allergies  Orders Added: 1)  T-PAP [facility]) [88142] 2)  Est. Patient Level I [02725]           Prevention For Positives: 10/20/2009   Safe sex practices discussed with patient. Condoms offered.                           Appended Document: PAP smear visit, Juanell Fairly Flowsheet updated

## 2010-07-03 NOTE — Progress Notes (Signed)
Summary: Pt assist med arrived via PAP nxtrfl 01/05/10  Phone Note Refill Request      New/Updated Medications: SINGULAIR 10 MG TABS (MONTELUKAST SODIUM) Take 1 tablet by mouth once a day Prescriptions: SINGULAIR 10 MG TABS (MONTELUKAST SODIUM) Take 1 tablet by mouth once a day  #90 x 0   Entered by:   Paulo Fruit  BS,CPht II,MPH   Authorized by:   Yisroel Ramming MD   Signed by:   Paulo Fruit  BS,CPht II,MPH on 11/02/2009   Method used:   Samples Given   RxID:   [id number]  Patient Assist Medication Verification: Medication name: Singulair 10mg  RX # [id number] Tech approva:MLD Call placed to patient with message that assistance medications are ready for pick-up. Paulo Fruit  BS,CPht II,MPH  November 02, 2009 11:08 AM

## 2010-07-03 NOTE — Miscellaneous (Signed)
Summary: RW Update  Clinical Lists Changes  Observations: Added new observation of DATE1STVISIT: 10/04/2009 (12/27/2009 12:06) Added new observation of RWPARTICIP: Yes (12/27/2009 12:06)

## 2010-07-19 ENCOUNTER — Encounter (INDEPENDENT_AMBULATORY_CARE_PROVIDER_SITE_OTHER): Payer: Self-pay | Admitting: *Deleted

## 2010-07-25 NOTE — Miscellaneous (Addendum)
Summary: RW Financial Update  Clinical Lists Changes  Observations: Added new observation of PCTFPL: 0  (07/19/2010 16:19) Added new observation of #CHILD<18 IN: No  (07/19/2010 16:19) Added new observation of AIDSDAP: No  (07/19/2010 16:19)

## 2010-08-16 ENCOUNTER — Other Ambulatory Visit (INDEPENDENT_AMBULATORY_CARE_PROVIDER_SITE_OTHER): Payer: Medicaid Other

## 2010-08-16 ENCOUNTER — Other Ambulatory Visit: Payer: Self-pay | Admitting: Adult Health

## 2010-08-16 ENCOUNTER — Encounter: Payer: Self-pay | Admitting: *Deleted

## 2010-08-16 ENCOUNTER — Encounter: Payer: Self-pay | Admitting: Adult Health

## 2010-08-16 DIAGNOSIS — B20 Human immunodeficiency virus [HIV] disease: Secondary | ICD-10-CM

## 2010-08-16 LAB — CONVERTED CEMR LAB
ALT: 24 units/L (ref 0–35)
AST: 43 units/L — ABNORMAL HIGH (ref 0–37)
Basophils Absolute: 0 10*3/uL (ref 0.0–0.1)
Calcium: 9 mg/dL (ref 8.4–10.5)
Chloride: 106 meq/L (ref 96–112)
Creatinine, Ser: 0.84 mg/dL (ref 0.40–1.20)
Eosinophils Absolute: 0 10*3/uL (ref 0.0–0.7)
Eosinophils Relative: 1 % (ref 0–5)
HCT: 32.8 % — ABNORMAL LOW (ref 36.0–46.0)
HIV 1 RNA Quant: <20 copies/mL (ref <20)
HIV-1 RNA Quant, Log: <1.3 (ref <1.30)
MCV: 102.2 fL — ABNORMAL HIGH (ref 78.0–100.0)
Platelets: 168 10*3/uL (ref 150–400)
RDW: 14.7 % (ref 11.5–15.5)
Sodium: 140 meq/L (ref 135–145)
Total Bilirubin: 0.4 mg/dL (ref 0.3–1.2)

## 2010-08-17 LAB — T-HELPER CELL (CD4) - (RCID CLINIC ONLY): CD4 % Helper T Cell: 36 % (ref 33–55)

## 2010-08-21 LAB — T-HELPER CELL (CD4) - (RCID CLINIC ONLY)
CD4 % Helper T Cell: 30 % — ABNORMAL LOW (ref 33–55)
CD4 T Cell Abs: 340 uL — ABNORMAL LOW (ref 400–2700)

## 2010-08-21 NOTE — Miscellaneous (Signed)
Summary: lab orders  Clinical Lists Changes  Orders: Added new Test order of T-CBC w/Diff [id number]) - Signed Added new Test order of T-CD4SP [facility]) (CD4SP) - Signed Added new Test order of T-Comprehensive Metabolic Panel [phone]) - Signed Added new Test order of T-HIV Viral Load [phone]) - Signed    pt is here for labs. entered under B. Sundra Aland, NP.Marland KitchenMarland KitchenGolden Circle RN  August 16, 2010 9:02 AM

## 2010-08-30 ENCOUNTER — Ambulatory Visit (INDEPENDENT_AMBULATORY_CARE_PROVIDER_SITE_OTHER): Payer: Medicaid Other | Admitting: Adult Health

## 2010-08-30 ENCOUNTER — Encounter: Payer: Self-pay | Admitting: Adult Health

## 2010-08-30 VITALS — BP 116/77 | HR 88 | Temp 98.1°F | Ht 67.0 in | Wt 150.0 lb

## 2010-08-30 DIAGNOSIS — E785 Hyperlipidemia, unspecified: Secondary | ICD-10-CM

## 2010-08-30 DIAGNOSIS — D649 Anemia, unspecified: Secondary | ICD-10-CM

## 2010-08-30 DIAGNOSIS — B20 Human immunodeficiency virus [HIV] disease: Secondary | ICD-10-CM

## 2010-08-30 LAB — FOLATE: Folate: 6.1 ng/mL

## 2010-08-30 LAB — VITAMIN B12: Vitamin B-12: 349 pg/mL (ref 211–911)

## 2010-08-30 NOTE — Progress Notes (Signed)
  Subjective:    Patient ID: Barbara Henry, female    DOB: 1961-04-04, 50 y.o.   MRN: [id number]  HPI In for f/u on labs.  Doing well on Artripla with good tolerance.  Voices no complaints.  allergies well-controlled ion meds.   Review of Systems  Constitutional: Negative.   HENT: Negative.   Eyes: Negative.   Respiratory: Negative.   Cardiovascular: Negative.   Gastrointestinal: Negative.   Genitourinary: Negative.   Musculoskeletal: Negative.   Skin: Negative.   Neurological: Negative.   Hematological: Negative.   Psychiatric/Behavioral: Negative.        Objective:   Physical Exam  Constitutional: She is oriented to person, place, and time. She appears well-developed and well-nourished. No distress.       Aromatic odor to breath, but no slurring speech or oddity in behavior.  HENT:  Head: Normocephalic and atraumatic.  Right Ear: External ear normal.  Left Ear: External ear normal.  Nose: Nose normal.  Mouth/Throat: Oropharynx is clear and moist. No oropharyngeal exudate.  Eyes: Conjunctivae and EOM are normal. Pupils are equal, round, and reactive to light. Right eye exhibits no discharge. Left eye exhibits no discharge. No scleral icterus.  Neck: Normal range of motion. Neck supple. No JVD present. No tracheal deviation present. No thyromegaly present.  Cardiovascular: Normal rate, regular rhythm, normal heart sounds and intact distal pulses.  Exam reveals no gallop and no friction rub.   No murmur heard. Pulmonary/Chest: Effort normal and breath sounds normal. No respiratory distress. She has no wheezes. She has no rales. She exhibits no tenderness.  Abdominal: Soft. Bowel sounds are normal. She exhibits no distension and no mass. There is no tenderness. There is no rebound and no guarding.  Musculoskeletal: Normal range of motion. She exhibits no edema and no tenderness.  Lymphadenopathy:    She has no cervical adenopathy.  Neurological: She is alert and oriented to  person, place, and time. She has normal reflexes. She displays normal reflexes. No cranial nerve deficit. She exhibits normal muscle tone. Coordination normal.  Skin: Skin is warm and dry. No rash noted. She is not diaphoretic. No erythema. No pallor.  Psychiatric: She has a normal mood and affect. Her behavior is normal. Judgment and thought content normal.          Assessment & Plan:  HIV:  CD4 250 @ 36% with VL <20 copies/ml  CPM for now.  Repeat labs 10 weeks with f/u in 3 months.  Anemia:  Hgb dropped to 10.9 and Hct 32.0  Will obtain B12, Folate, Iron & TIBC today.  If values continue to decline, an assessment for occult blood loss should be made.

## 2010-09-13 LAB — CBC
HCT: 31.6 % — ABNORMAL LOW (ref 36.0–46.0)
HCT: 31.9 % — ABNORMAL LOW (ref 36.0–46.0)
HCT: 34 % — ABNORMAL LOW (ref 36.0–46.0)
HCT: 35.1 % — ABNORMAL LOW (ref 36.0–46.0)
Hemoglobin: 10.9 g/dL — ABNORMAL LOW (ref 12.0–15.0)
Hemoglobin: 10.9 g/dL — ABNORMAL LOW (ref 12.0–15.0)
Hemoglobin: 11.7 g/dL — ABNORMAL LOW (ref 12.0–15.0)
Hemoglobin: 12 g/dL (ref 12.0–15.0)
Hemoglobin: 12 g/dL (ref 12.0–15.0)
MCHC: 34.1 g/dL (ref 30.0–36.0)
MCHC: 34.2 g/dL (ref 30.0–36.0)
MCHC: 34.4 g/dL (ref 30.0–36.0)
MCHC: 34.6 g/dL (ref 30.0–36.0)
MCV: 92.8 fL (ref 78.0–100.0)
MCV: 93 fL (ref 78.0–100.0)
MCV: 93.4 fL (ref 78.0–100.0)
MCV: 93.8 fL (ref 78.0–100.0)
Platelets: 166 10*3/uL (ref 150–400)
Platelets: 168 10*3/uL (ref 150–400)
Platelets: 169 10*3/uL (ref 150–400)
Platelets: 182 10*3/uL (ref 150–400)
Platelets: 190 10*3/uL (ref 150–400)
RBC: 3.4 MIL/uL — ABNORMAL LOW (ref 3.87–5.11)
RBC: 3.41 MIL/uL — ABNORMAL LOW (ref 3.87–5.11)
RBC: 3.66 MIL/uL — ABNORMAL LOW (ref 3.87–5.11)
RBC: 3.76 MIL/uL — ABNORMAL LOW (ref 3.87–5.11)
RDW: 13.5 % (ref 11.5–15.5)
RDW: 13.5 % (ref 11.5–15.5)
RDW: 13.7 % (ref 11.5–15.5)
RDW: 13.7 % (ref 11.5–15.5)
RDW: 13.8 % (ref 11.5–15.5)
WBC: 3.6 10*3/uL — ABNORMAL LOW (ref 4.0–10.5)
WBC: 3.7 10*3/uL — ABNORMAL LOW (ref 4.0–10.5)
WBC: 3.7 10*3/uL — ABNORMAL LOW (ref 4.0–10.5)
WBC: 4.3 10*3/uL (ref 4.0–10.5)
WBC: 5.8 10*3/uL (ref 4.0–10.5)

## 2010-09-13 LAB — BASIC METABOLIC PANEL
BUN: 10 mg/dL (ref 6–23)
Chloride: 103 mEq/L (ref 96–112)
Creatinine, Ser: 0.72 mg/dL (ref 0.4–1.2)
Glucose, Bld: 101 mg/dL — ABNORMAL HIGH (ref 70–99)
Potassium: 3.8 mEq/L (ref 3.5–5.1)

## 2010-09-13 LAB — HEMOGLOBIN AND HEMATOCRIT, BLOOD
HCT: 36.9 % (ref 36.0–46.0)
HCT: 37.1 % (ref 36.0–46.0)
Hemoglobin: 12.3 g/dL (ref 12.0–15.0)
Hemoglobin: 12.4 g/dL (ref 12.0–15.0)

## 2010-09-13 LAB — COMPREHENSIVE METABOLIC PANEL
Albumin: 3.1 g/dL — ABNORMAL LOW (ref 3.5–5.2)
Alkaline Phosphatase: 92 U/L (ref 39–117)
BUN: 6 mg/dL (ref 6–23)
CO2: 27 mEq/L (ref 19–32)
Chloride: 106 mEq/L (ref 96–112)
GFR calc non Af Amer: >60 mL/min (ref >60)
Potassium: 3.7 mEq/L (ref 3.5–5.1)
Total Bilirubin: 0.9 mg/dL (ref 0.3–1.2)

## 2010-09-13 LAB — PROTIME-INR
INR: 1 (ref 0.00–1.49)
Prothrombin Time: 13.1 seconds (ref 11.6–15.2)

## 2010-09-13 LAB — ABO/RH: ABO/RH(D): O POS

## 2010-09-13 LAB — AMMONIA: Ammonia: 44 umol/L — ABNORMAL HIGH (ref 11–35)

## 2010-09-13 LAB — POCT PREGNANCY, URINE: Preg Test, Ur: NEGATIVE

## 2010-09-21 ENCOUNTER — Other Ambulatory Visit (HOSPITAL_COMMUNITY)
Admission: RE | Admit: 2010-09-21 | Discharge: 2010-09-21 | Disposition: A | Discharge status: Home / Self Care | Payer: Medicaid Other | Source: Ambulatory Visit | Attending: Infectious Diseases | Admitting: Infectious Diseases

## 2010-09-21 ENCOUNTER — Ambulatory Visit (INDEPENDENT_AMBULATORY_CARE_PROVIDER_SITE_OTHER): Payer: Medicaid Other | Admitting: *Deleted

## 2010-09-21 DIAGNOSIS — Z01419 Encounter for gynecological examination (general) (routine) without abnormal findings: Secondary | ICD-10-CM | POA: Insufficient documentation

## 2010-09-21 DIAGNOSIS — Z124 Encounter for screening for malignant neoplasm of cervix: Secondary | ICD-10-CM

## 2010-09-21 NOTE — Patient Instructions (Signed)
  Place pap smear patient instructions here.   Your results will be ready in approximately 1 week.  I will mail them to you

## 2010-09-28 ENCOUNTER — Encounter: Payer: Self-pay | Admitting: *Deleted

## 2010-10-16 NOTE — Discharge Summary (Signed)
Barbara Henry, TOUTANT NO.:  [id number]   MEDICAL RECORD NO.:  [id number]          PATIENT TYPE:  INP   LOCATION:  5121                         FACILITY:  [facility]   PHYSICIAN:  Cherylynn Ridges, M.D.    DATE OF BIRTH:  02/14/1961   DATE OF ADMISSION:  08/25/2008  DATE OF DISCHARGE:  08/29/2008                               DISCHARGE SUMMARY   ADMITTING TRAUMA SURGEON:  Lennie Muckle, MD   CONSULTANTS:  1. Almedia Balls. Ranell Patrick, MD, Orthopedic Surgery.  2. Antonietta Breach, MD, Psychiatry.   DISCHARGE DIAGNOSES:  1. Fall from a porch.  2. Retroperitoneal hematoma.  3. Flank contusion.  4. Facial laceration.  5. Facial contusion.  6. 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  7. Concussion.  8. Ethyl alcohol abuse, chronic.  9. Marijuana abuse, chronic.   HISTORY ON ADMISSION:  This is a 50 year old female who reports that she  blacked out when standing on a porch and he may have fallen.  She was  complaining of back pain and a headache.   Workup in the ED at this time including plain chest x-ray and plain  pelvic films were both negative.  Right knee plain films showed a high-  riding patella.  Head CT scan was without acute intracranial  abnormality.  CT scan of the cervical spine was also negative.  Abdominal and pelvic CT scan showed a small retroperitoneal hematoma  approximately 2.5 x 3.1 cm, and the patient did have a corresponding  right flank contusion noted.   The patient was obviously intoxicated and was admitted for pain control  mobilization, further evaluation per Orthopedic Surgery.  Dr. Malon Kindle saw the patient in consultation and felt that her injury to her  patellar tendon was likely old, but she did have some increased pain in  this knee and she was placed in a knee immobilizer and maintained  weightbearing as tolerated.  She was going to follow up with Dr. Ranell Patrick  in approximately 2 weeks.   Concussion, mild with facial lacerations contusions.  The patient did  well with regards to these and no further followup was needed.   Retroperitoneal hematoma.  The patient did have a slight decline in her  hemoglobin and hematocrit following her admission, but this has since  stabilized and normalized with a hemoglobin of 12.0, hematocrit of 35.6  at the time of discharge, platelets 169,000, white blood cell count of  3.6 at the time of discharge.  The patient is afebrile.  She has no  abdominal pain, minimal complaints of back pain.  She is taking Percocet  intermittently for relief of this.   Chronic alcohol abuse.  The patient did undergo alcohol and substance  abuse screening by our social worker and her scores did indicate as it  is drinking levels.  The patient, however, initially declined alcohol  treatment.  Dr. Jeanie Sewer was consulted, and the patient was interested  in alcohol treatment.  She has been given information on the 12-step  method and  information on how to contact outpatient treatment centers.  The patient does desire discharge at this time.  She is ambulatory for  long distances with only loose supervision, which is not needed at this  time of discharge.  She is also tolerating regular diet.   MEDICATIONS AT THE TIME OF DISCHARGE:  Percocet 5/325 mg 1-2 p.o. q.4 h.  p.r.n. pain, #60, no refill.   Again, she will follow up with Dr. Ranell Patrick in 2 weeks.  She will call  Trauma Services as needed for question.  She does need to follow up with  alcohol treatment program as instructed by social work and Psychiatry.      Shawn Rayburn, P.A.      Cherylynn Ridges, M.D.  Electronically Signed    SR/MEDQ  D:  08/29/2008  T:  08/30/2008  Job:  [id number]   cc:   Almedia Balls. Ranell Patrick, M.D.  [facility]

## 2010-10-16 NOTE — Consult Note (Signed)
NAMEEARNEST, THALMAN NO.:  [id number]   MEDICAL RECORD NO.:  [id number]          PATIENT TYPE:  INP   LOCATION:  5121                         FACILITY:  [facility]   PHYSICIAN:  Antonietta Breach, M.D.  DATE OF BIRTH:  02-23-1961   DATE OF CONSULTATION:  08/26/2008  DATE OF DISCHARGE:                                 CONSULTATION   REQUESTING PHYSICIAN:  Jimmye Norman.   REASON FOR CONSULTATION:  Alcohol dependence, evaluate, recommend  treatment.   HISTORY OF PRESENT ILLNESS:  Barbara Henry is a 50 year old female  admitted to the [facility] on August 25, 2008, due to a severe  hematoma and fall.   She was alcohol intoxicated with a blood level of 402 by the time she  arrived to the emergency room.  She had fallen and she was suffering  from a retroperitoneal hematoma.   She denies any thoughts of harming herself or others.  Her mood and  interests are within normal limits.  She has constructive future goals.  She is not having any hallucinations or delusions.  She is socially  appropriate and cooperative.   She denies drinking alcohol every day; however, she is on the Ativan  withdrawal protocol and is tolerating it well.  She is not demonstrating  any tremor or sweats.  She states that she is motivated for alcohol  psychosocial rehab.   PAST PSYCHIATRIC HISTORY:  She denies any history of major depression,  use of illegal drugs, or any form of mental treatment.   She states that she has heard of the 12-step program but has never  participated in it.   FAMILY PSYCHIATRIC HISTORY:  None known.   SOCIAL HISTORY:  She has been dating her boyfriend for 4 years.  They  did have an argument prior to the patient blacking out; however, they  are continuing in a mutually supportive relationship.  He is visiting  her in the hospital today providing support.   The patient stated that she did not want to pursue treatment with the  clinical social worker; however,  she has changed her mind with the  undersigned.   She told the admitting physician that she uses marijuana.   PAST MEDICAL HISTORY:  1. Retroperitoneal hematoma.  2. Seasonal allergies.   MEDICATIONS:  She is on the Ativan withdrawal protocol.   SHE HAS NO KNOWN DRUG ALLERGIES.   REVIEW OF SYSTEMS:  Noncontributory.   EXAMINATION:  Bilateral hand extension, no tremor.  VITAL SIGNS:  Temperature 98.7.  Pulse 95.  Respiratory rate 18.  Blood  pressure 134/82.  O2 saturation on room air 100%.   MENTAL STATUS EXAM:  Barbara Henry is alert.  Her eye contact is good.  She  does have significant swelling with the left eyelids and orbit.  She has  intact attention and concentration.  Her affect is broad and  appropriate.  Her mood is within normal limits.  Her orientation is  intact to all spheres.  Her memory is intact to immediate, recent, and  remote except for the blackout.  Her speech involves  normal rate and  without dysarthria.  Thought process is logical, coherent, goal  directed.  No looseness of associations.  Thought content, no thoughts  of harming herself or others.  No delusions or hallucinations.  Her  insight is intact for the severity of her alcohol problem.  She states  that she is motivated for treatment.   Judgment is intact.   ASSESSMENT:  1. Alcohol dependence.  2. Cannabis abuse.  AXIS II:  Deferred.  AXIS III:  See past medical history.  AXIS IV:  Primary support group.  AXIS V:  55.   Barbara Henry is not at risk to harm herself or others.  She agrees to call  emergency services immediately for any thoughts of harming herself or  others or distress.   The patient requested that her boyfriend remain in the room to  facilitate education and support.   The undersigned provided information regarding the 12-step method, as  well as available alcohol rehabilitation centers.   Barbara Henry states that she would like some 12-step materials and that  she would like  to be set up with a chemical dependence rehabilitation  program.   Once the Ativan taper is done, would continue thiamine indefinitely.   Would ask the social worker to set Barbara Henry up with a [facility]  appointment and provide 12-step materials.      Antonietta Breach, M.D.  Electronically Signed     JW/MEDQ  D:  08/26/2008  T:  08/26/2008  Job:  [id number]

## 2010-10-16 NOTE — H&P (Signed)
NAMEVERLINDA, SLOTNICK NO.:  [id number]   MEDICAL RECORD NO.:  [id number]          PATIENT TYPE:  INP   LOCATION:  5121                         FACILITY:  [facility]   PHYSICIAN:  Lennie Muckle, MD      DATE OF BIRTH:  1960-09-05   DATE OF ADMISSION:  08/25/2008  DATE OF DISCHARGE:                              HISTORY & PHYSICAL   Ms. Eley is a 50 year old female who reports blacking out.  Apparently, she was arguing with her boyfriend and states she had a  headache.  She then does not recall any events but wakes up and  complains of headache.  She was reported to have fallen approximately 4  feet by EMS.  She does currently have back pain and continues to have  headache.   PAST MEDICAL HISTORY:  Seasonal allergies.   SURGICAL HISTORY:  Negative.   SOCIAL HISTORY:  She does have tobacco use.  Is a heavy drinker.  She  drinks approximately 2-3 times a week and does use marijuana and no  tobacco.   OVER-THE-COUNTER MEDICATIONS:  For her seasonal allergies.   REVIEW OF SYSTEMS:  Negative.   PHYSICAL EXAMINATION:  VITAL SIGNS:  Temperature 97.7, pulse 91, blood  pressure 150/91, O2 sat 100% on room air.  SKIN:  She has a small 2-cm laceration which was repaired by the  emergency department.  HEENT:  She does have some mild swelling and ecchymosis on the left  frontal parietal area, some mild abrasion on the left cheek, she has  some tenderness over the frontal sinus as well as the ethmoid area.  Dentition is poor but there is no instability.  Extraocular muscles are  intact.  Pupils are equal, round, and reactive to light.  Tympanic  membranes are clear bilaterally.  NECK:  Without tenderness.  LUNGS:  Clear to auscultation bilaterally.  CARDIOVASCULAR:  Regular rate and rhythm.  ABDOMEN:  She has some mild tenderness in the left lower quadrant.  Pelvis is stable.  MUSCULOSKELETAL:  She has an abrasion on her right knee with some mild  swelling, some pain  with palpation, she also has abrasion on her left  hand.  Back she has some tenderness in the left thoracic lumbar region  but no abrasions are noted.  No step-offs. NEUROLOGIC:  Grossly intact.  She moves all four extremities is symmetric on examination.   LABORATORY DATA:  BUN and creatinine 10 and 0.7, hemoglobin 12.7,  hematocrit 37, alcohol level is 402.  Chest x-ray is negative.  Pelvis  negative.  Right knee there is a question of a high-riding patella.  CT  of the head is negative.  CT of the C-spine negative.  Abdomen and  pelvis there is a question of a small hematoma retroperitoneal near the  gastrohepatic ligament.  There is also an abrasion on the right flank.  No free fluid is noted.  No abdominal organ injury is seen.   ASSESSMENT AND PLAN:  Status post fall with:  1. Retroperitoneal hematoma.  2. Flank abrasion.  3. Question of patellar injury.  Admit the patient with repeat hemoglobin and hematocrit now and in the  morning.  Bed rest for now.  I will place SCDs for deep vein thrombosis  prophylaxis.  Hold anticoagulants until repeat hemoglobin/hematocrit.  I  have called Dr. Ranell Patrick with Orthopedics for question of patellar injury.  He will evaluate the patient for further imaging and/or treatment is  needed.  She does have some back tenderness but no frank abnormalities  seen on CT imaging of the T and L-spine.  Will clinically clear when she  is over both T, L and C spine.  We will maintain [place] J for now.  Gastrointestinal prophylaxis with Protonix.  We will allow clear liquids  overnight and then readdress in the morning.      Lennie Muckle, MD  Electronically Signed     ALA/MEDQ  D:  08/25/2008  T:  08/26/2008  Job:  [id number]

## 2010-10-21 IMAGING — MG MM DIGITAL SCREENING BILAT
4 series · 4 of 4 positions shown · non-contrast
Comparison: none

DG SCREEN MAMMOGRAM BILATERAL
Bilateral CC and MLO view(s) were taken.
Technologist: Wanishi, Jyunji.(ARMIEN)(M)

DIGITAL SCREENING MAMMOGRAM WITH CAD:
There are scattered fibroglandular densities.  Asymmetry and possible masses are noted in the left 
breast.  Spot compression views and possibly sonography are recommended for further evaluation.  In
the right breast, no masses or malignant type calcifications are identified.

[R CC]
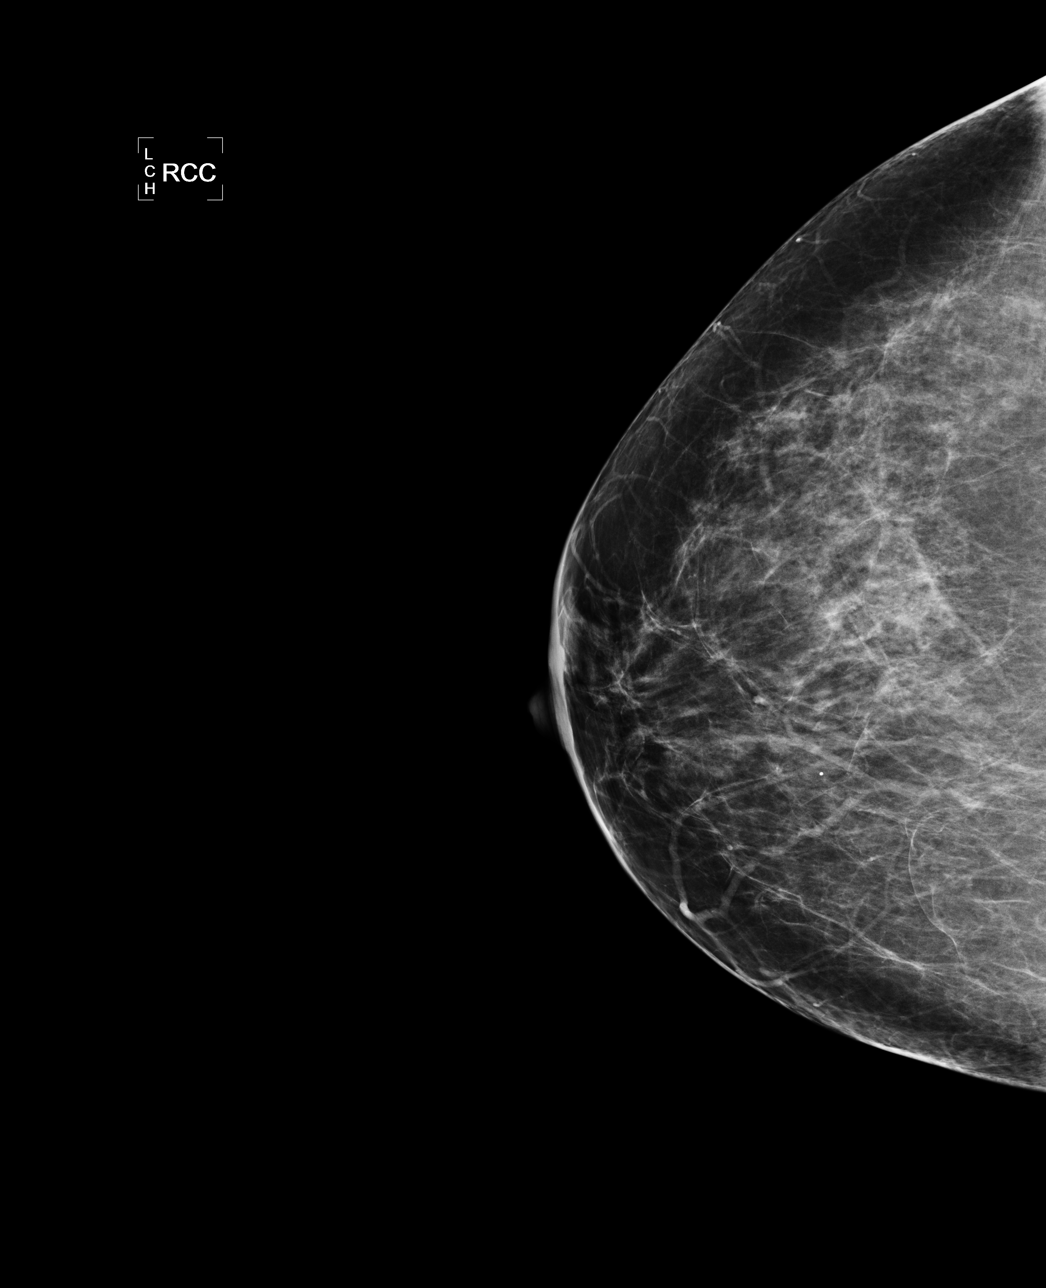

[R MLO]
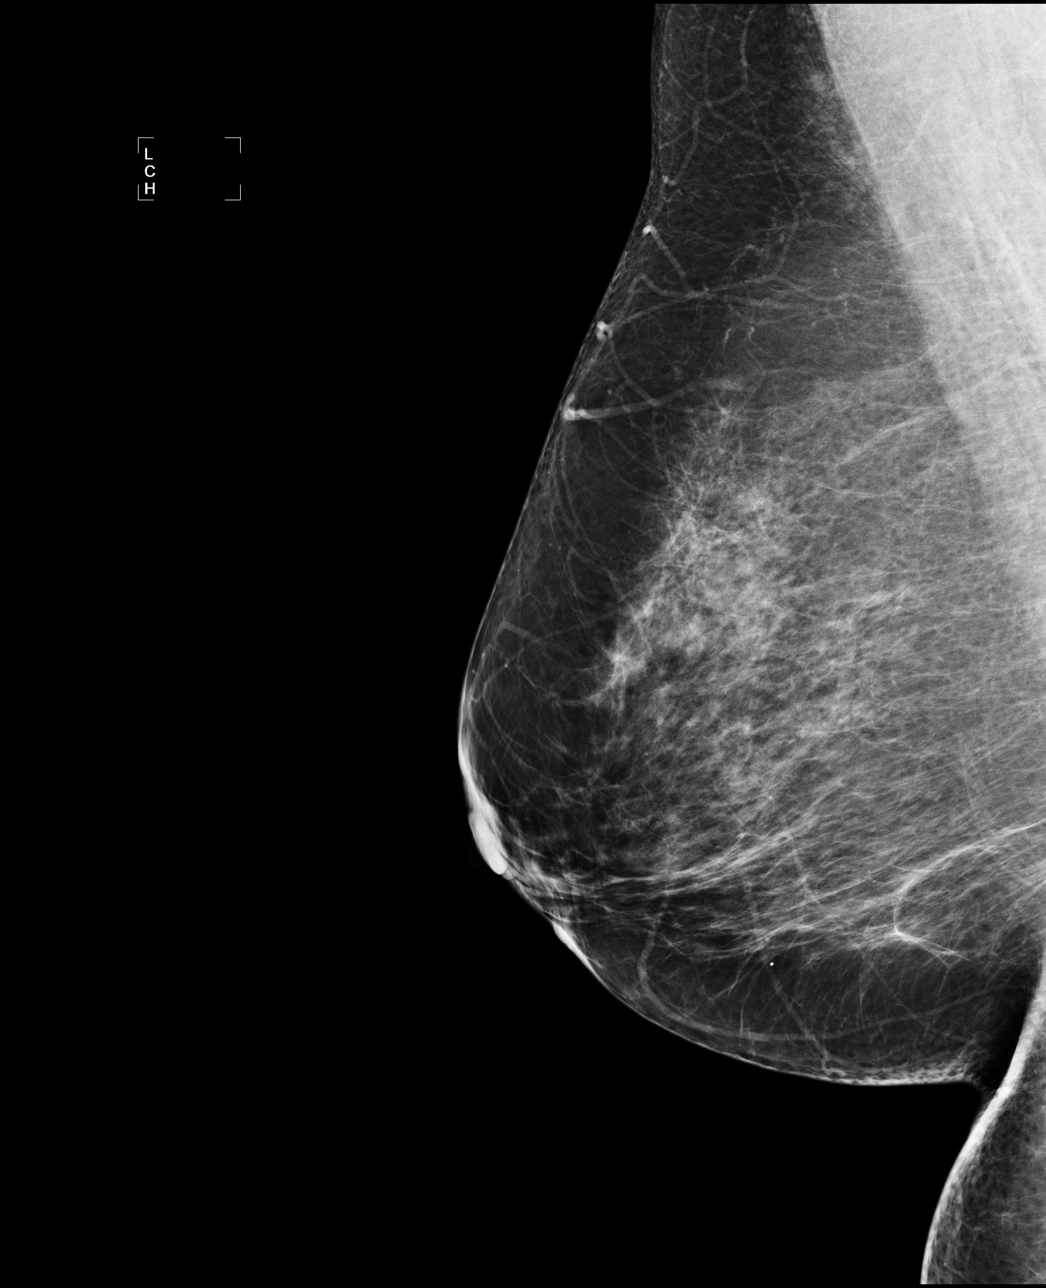

[L CC]
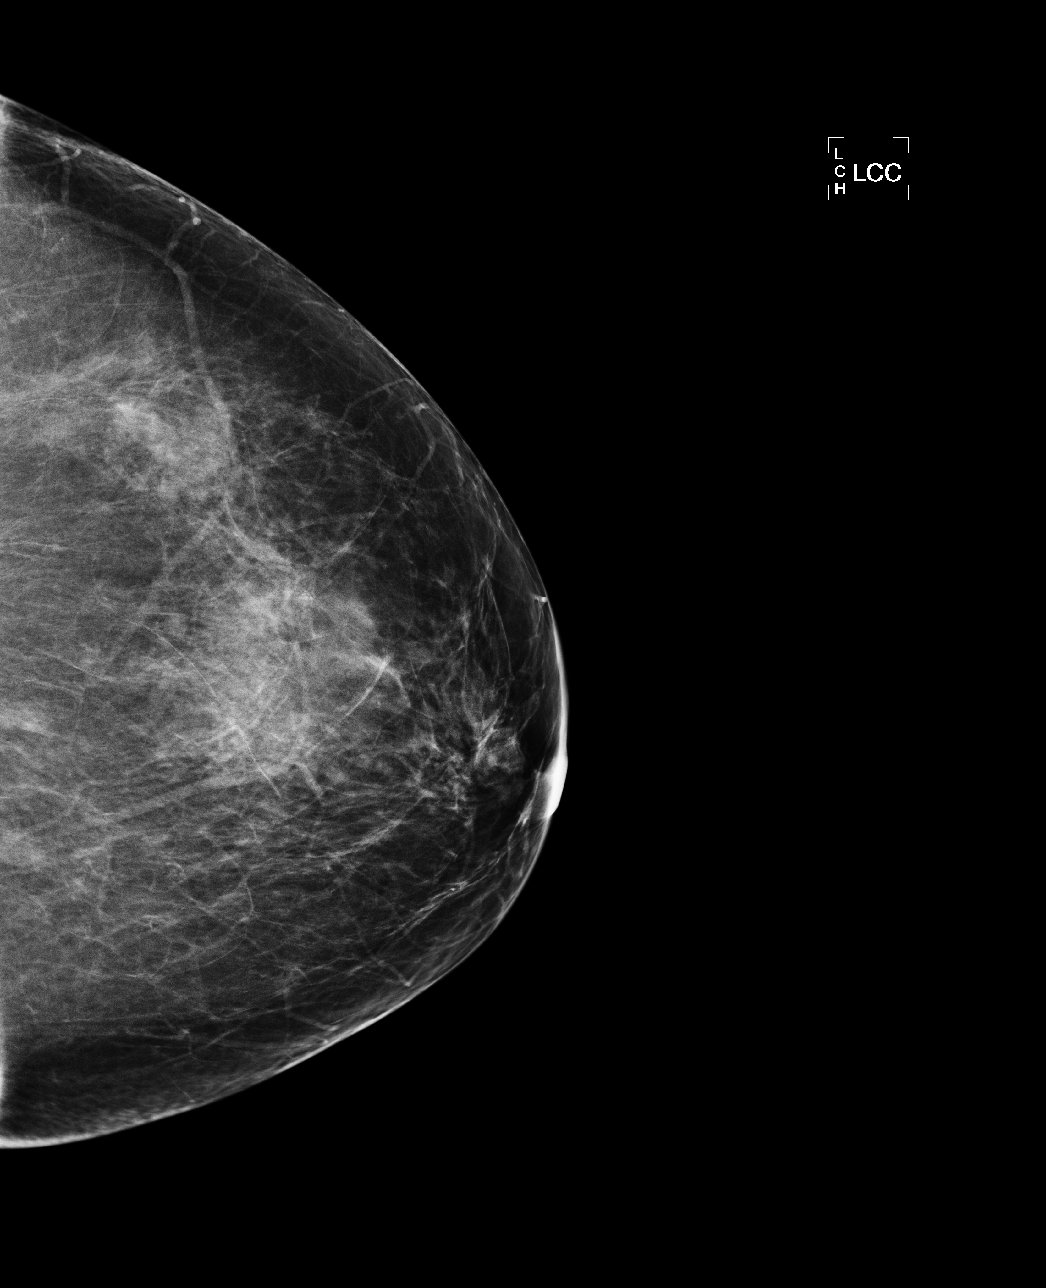

[L MLO]
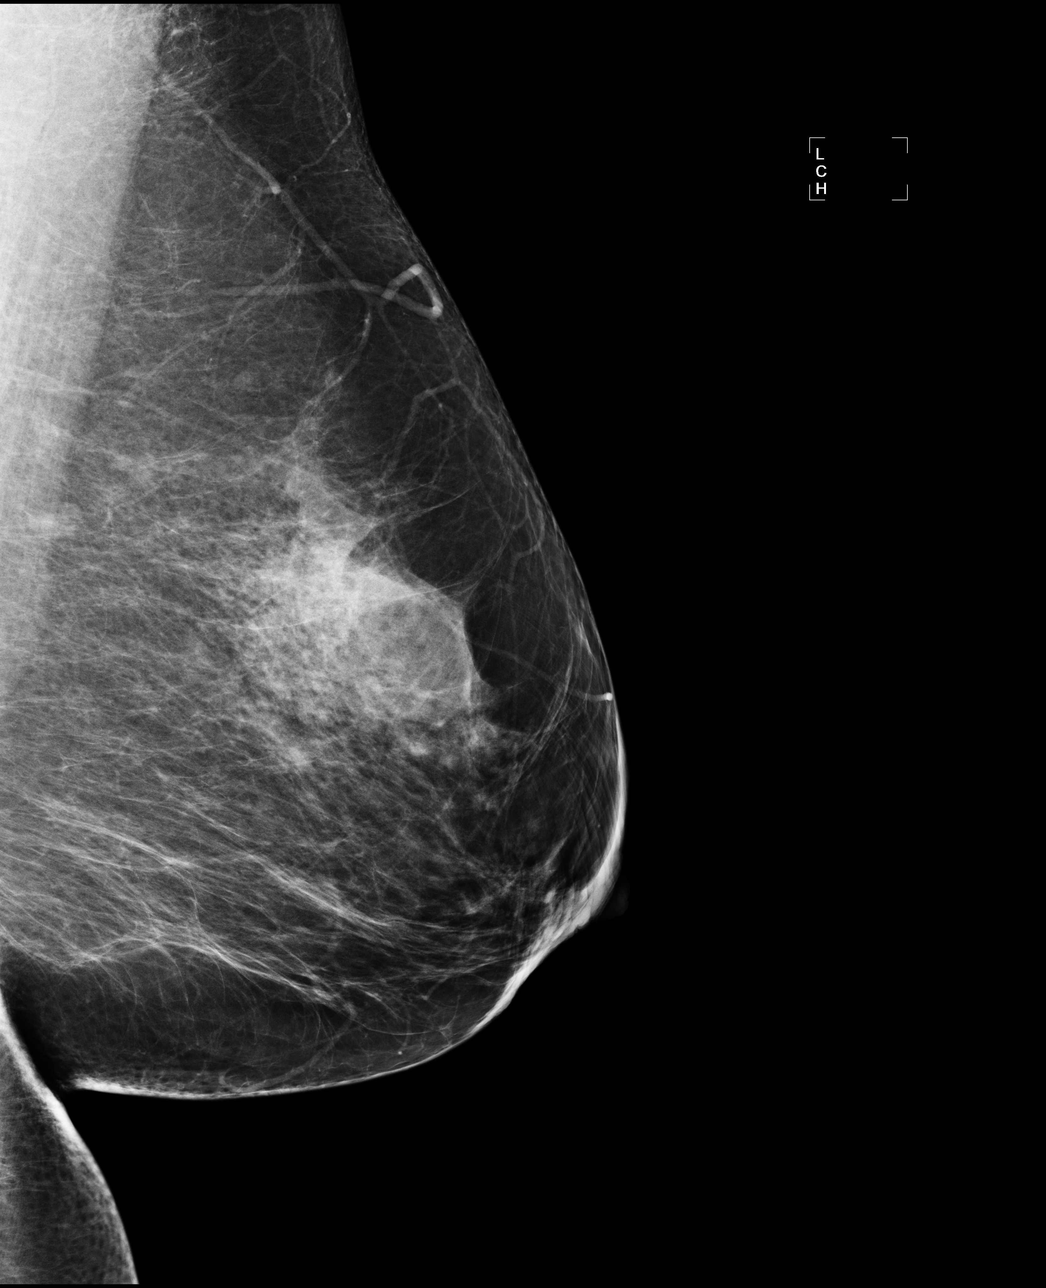

[4 of 4 positions shown; findings below may reference images not displayed]

IMPRESSION: Asymmetry and possible masses, left breast.  Additional evaluation is indicated.  The patient will 
be contacted for additional studies and a supplementary report will follow.  No specific 
mammographic evidence of malignancy, right breast.

ASSESSMENT: Need additional imaging evaluation and/or prior mammograms for comparison - BI-RADS 0

Further imaging of the left breast.
ANALYZED BY COMPUTER AIDED DETECTION. , THIS PROCEDURE WAS A DIGITAL MAMMOGRAM.

## 2010-11-14 ENCOUNTER — Other Ambulatory Visit (INDEPENDENT_AMBULATORY_CARE_PROVIDER_SITE_OTHER): Payer: Medicaid Other

## 2010-11-14 ENCOUNTER — Other Ambulatory Visit: Payer: Self-pay | Admitting: Infectious Diseases

## 2010-11-14 DIAGNOSIS — B20 Human immunodeficiency virus [HIV] disease: Secondary | ICD-10-CM

## 2010-11-14 DIAGNOSIS — E785 Hyperlipidemia, unspecified: Secondary | ICD-10-CM

## 2010-11-14 LAB — CBC WITH DIFFERENTIAL/PLATELET
Hemoglobin: 10.8 g/dL — ABNORMAL LOW (ref 12.0–15.0)
Lymphocytes Relative: 22 % (ref 12–46)
Lymphs Abs: 0.9 10*3/uL (ref 0.7–4.0)
Monocytes Relative: 12 % (ref 3–12)
Neutro Abs: 2.7 10*3/uL (ref 1.7–7.7)
Neutrophils Relative %: 64 % (ref 43–77)
RBC: 3.14 MIL/uL — ABNORMAL LOW (ref 3.87–5.11)

## 2010-11-14 LAB — COMPREHENSIVE METABOLIC PANEL
Albumin: 4.1 g/dL (ref 3.5–5.2)
Alkaline Phosphatase: 129 U/L — ABNORMAL HIGH (ref 39–117)
CO2: 23 mEq/L (ref 19–32)
Chloride: 104 mEq/L (ref 96–112)
Glucose, Bld: 78 mg/dL (ref 70–99)
Potassium: 4.1 mEq/L (ref 3.5–5.3)
Sodium: 140 mEq/L (ref 135–145)
Total Protein: 7.6 g/dL (ref 6.0–8.3)

## 2010-11-14 LAB — LIPID PANEL: Total CHOL/HDL Ratio: 2 Ratio

## 2010-11-15 LAB — HIV-1 RNA QUANT-NO REFLEX-BLD
HIV 1 RNA Quant: <20 copies/mL (ref <20)
HIV-1 RNA Quant, Log: <1.3 {Log} (ref <1.30)

## 2010-11-15 LAB — T-HELPER CELL (CD4) - (RCID CLINIC ONLY)
CD4 % Helper T Cell: 35 % (ref 33–55)
CD4 T Cell Abs: 320 uL — ABNORMAL LOW (ref 400–2700)

## 2010-11-25 IMAGING — MG MM DIAGNOSTIC LTD LEFT
2 series · 2 of 2 positions shown · non-contrast
Comparison: 09/29/2008

CLINICAL DATA: Three areas of asymmetry on the left breast on
recent baseline screening mammogram

DIGITAL DIAGNOSTIC  LEFT  MAMMOGRAM   AND LEFT BREAST ULTRASOUND:

[L CC]
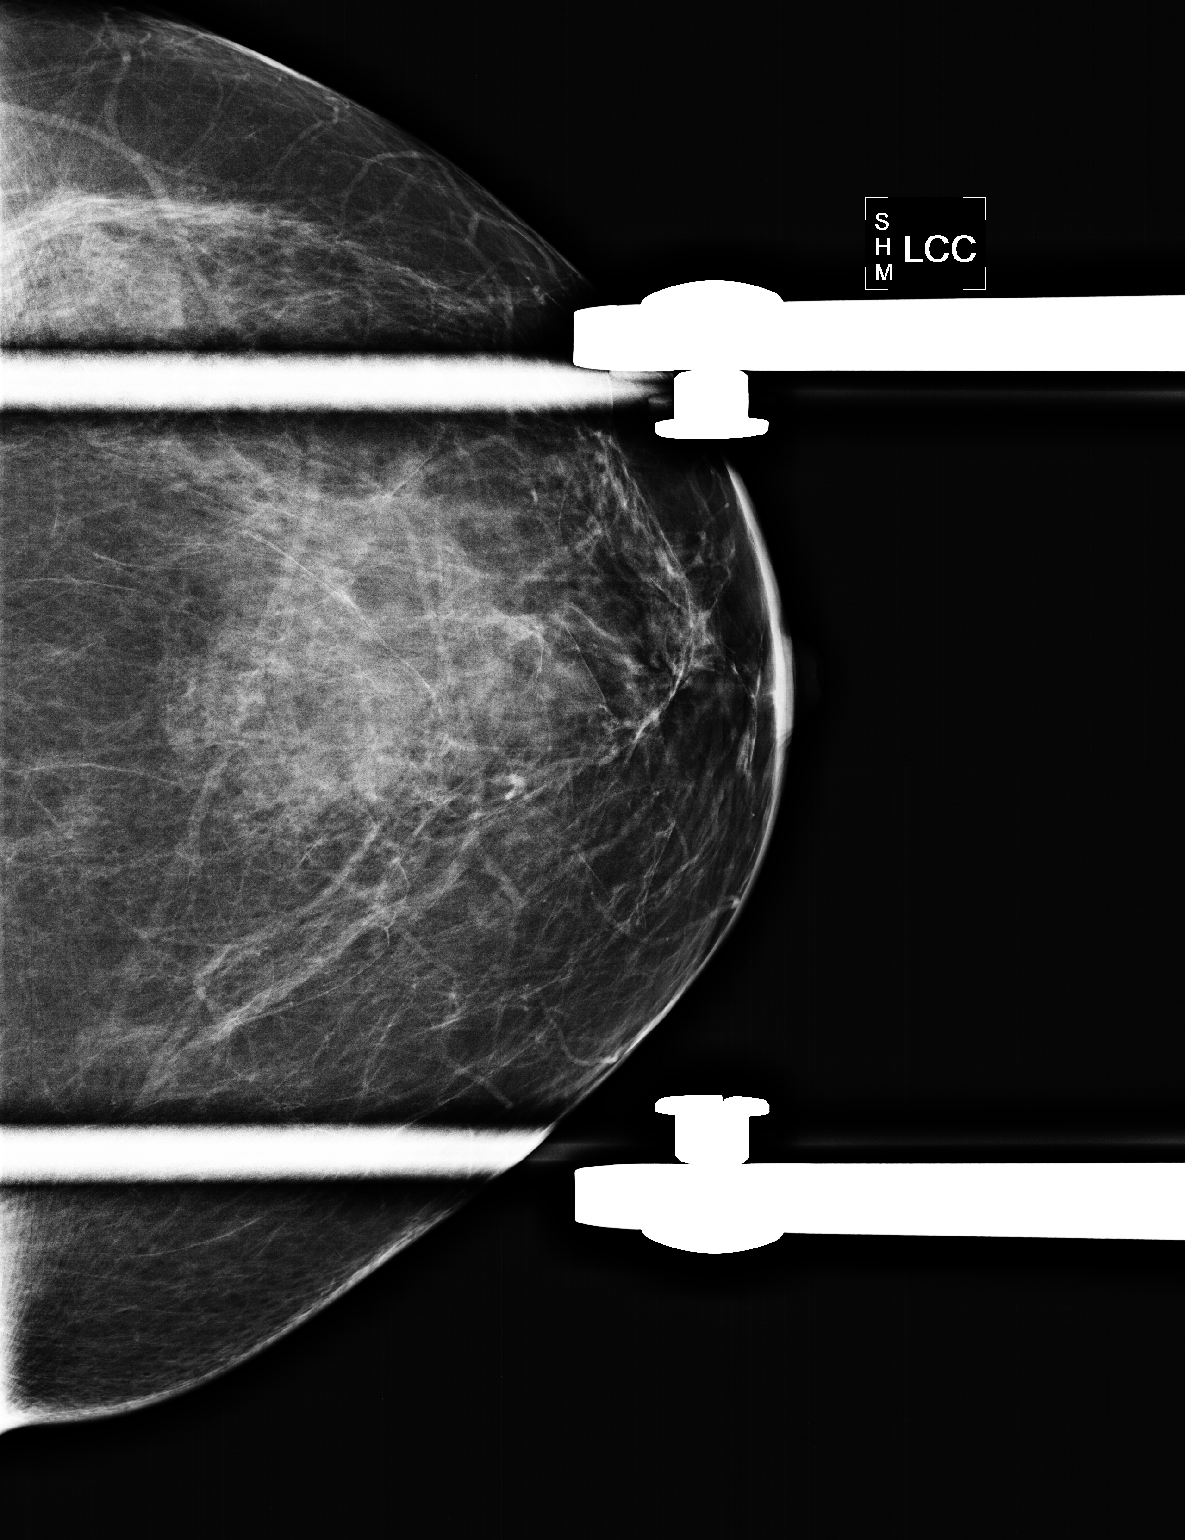

[L MLO]
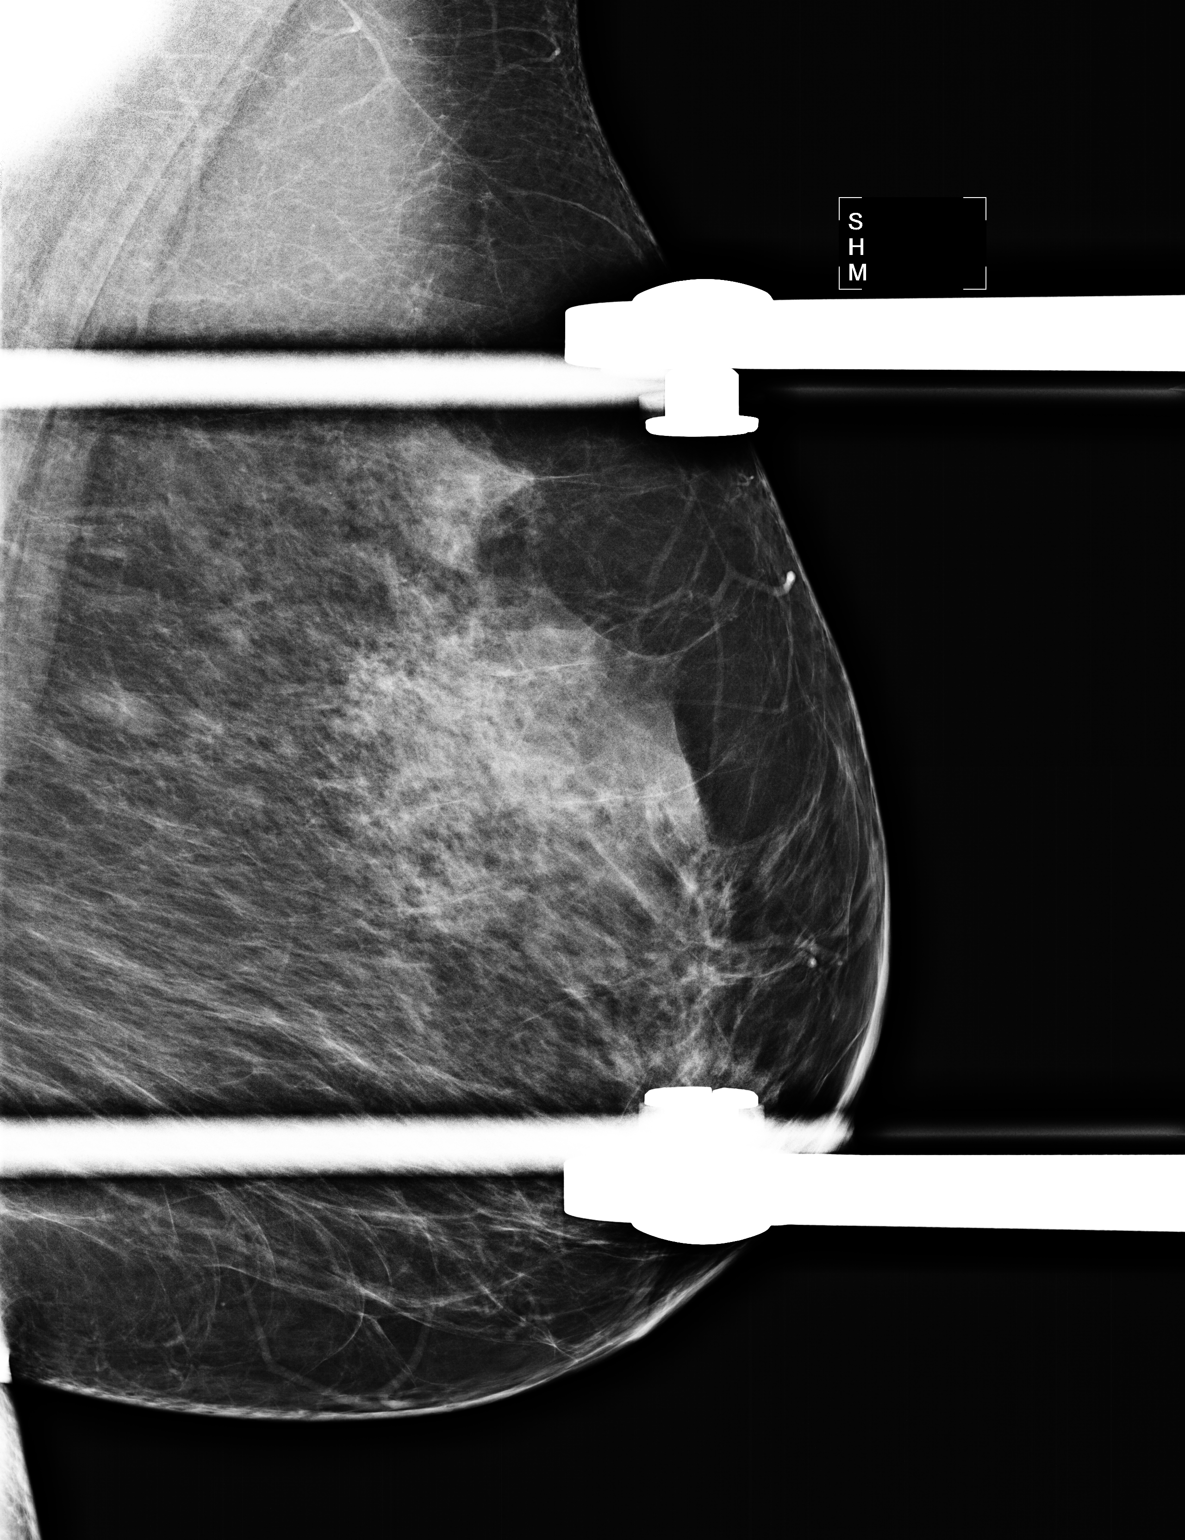

[2 of 2 positions shown; findings below may reference images not displayed]

FINDINGS: Area of increased attenuation persists in the 12 o'clock
position middle third depth without mass or architectural
distortion.  Posteriorly in the 11 o'clock position a second
smaller ovoid asymmetry persists without mass or architectural
distortion.

On physical exam, there are no abnormalities.

Ultrasound is performed, showing no abnormalities in the left
breast from the 10 to 2 o'clock positions.
IMPRESSION: Probable asymmetric breast tissue.

BI-RADS CATEGORY 3:  Probably benign finding(s) - short interval
follow-up suggested.

Recommendation:

Diagnostic left mammogram in 6 months.

## 2010-11-29 ENCOUNTER — Encounter: Payer: Self-pay | Admitting: Adult Health

## 2010-11-29 ENCOUNTER — Ambulatory Visit (INDEPENDENT_AMBULATORY_CARE_PROVIDER_SITE_OTHER): Payer: Self-pay | Admitting: Adult Health

## 2010-11-29 DIAGNOSIS — M25462 Effusion, left knee: Secondary | ICD-10-CM | POA: Insufficient documentation

## 2010-11-29 DIAGNOSIS — M25469 Effusion, unspecified knee: Secondary | ICD-10-CM

## 2010-11-29 DIAGNOSIS — E785 Hyperlipidemia, unspecified: Secondary | ICD-10-CM

## 2010-11-29 DIAGNOSIS — B20 Human immunodeficiency virus [HIV] disease: Secondary | ICD-10-CM

## 2010-11-29 NOTE — Progress Notes (Signed)
  Subjective:    Patient ID: Barbara Henry, female    DOB: 11-30-1960, 50 y.o.   MRN: [id number]  HPI Presents to clinic for scheduled followup. Endorses adherence to medications with good tolerance and no complications. Complaints of a new onset of swelling to the left knee occurred only. This morning. Complains of some stiffness, and slight pain. Denies any trauma or significant injury.   Review of Systems  Constitutional: Negative.   HENT: Negative.   Eyes: Negative.   Respiratory: Negative.   Cardiovascular: Negative.   Gastrointestinal: Negative.   Genitourinary: Negative.   Musculoskeletal: Positive for joint swelling, arthralgias and gait problem.  Skin: Negative.   Neurological: Negative.   Hematological: Negative.   Psychiatric/Behavioral: Negative.        Objective:   Physical Exam  Constitutional: She is oriented to person, place, and time. She appears well-developed and well-nourished. No distress.  HENT:  Head: Normocephalic and atraumatic.  Eyes: Conjunctivae and EOM are normal. Pupils are equal, round, and reactive to light.  Neck: Normal range of motion. Neck supple.  Cardiovascular: Normal rate and regular rhythm.   Pulmonary/Chest: Effort normal and breath sounds normal.  Abdominal: Soft. Bowel sounds are normal.  Musculoskeletal: She exhibits edema.       Nonpitting edema noted to left knee demonstrated near full range of motion. No deformity noted  Neurological: She is alert and oriented to person, place, and time.  Skin: Skin is warm and dry.  Psychiatric: She has a normal mood and affect. Her behavior is normal. Judgment and thought content normal.          Assessment & Plan:  1. HIV. Labs obtained 11/14/2010 show a CD4 count of 320 at 35% with a viral load of less than 20 copies/mL. Clinically stable on current regimen. Recommend continuing present management and following up in 4 months with repeat labs 2 weeks before next appointment.  2.  Swelling of left knee. As there is no reported trauma and behavior appears to be relatively good range of motion and no deformity to the left leg, we recommended she just, place, ice on it for now, and increase activity as tolerated. Recommend also, ibuprofen, 600 mg every 8 hours when necessary.  She verbally acknowledged all this information and agreed with plan of care.

## 2010-12-17 ENCOUNTER — Ambulatory Visit: Payer: Self-pay

## 2010-12-27 ENCOUNTER — Telehealth: Payer: Self-pay | Admitting: *Deleted

## 2010-12-27 NOTE — Telephone Encounter (Signed)
Patient's sister called saying patient is having nausea and not eating well.  Advised her to have patient call and schedule an appointment to see [facility].  She no showed an appointment 12/17/10.  Was not able to discuss anything with sister because I do not think she is on her HIPPA.  Sister will try to have her schedule and appointment. Wendall Mola CMA

## 2010-12-28 ENCOUNTER — Ambulatory Visit (INDEPENDENT_AMBULATORY_CARE_PROVIDER_SITE_OTHER): Payer: Self-pay | Admitting: Adult Health

## 2010-12-28 ENCOUNTER — Encounter: Payer: Self-pay | Admitting: Adult Health

## 2010-12-28 DIAGNOSIS — H6692 Otitis media, unspecified, left ear: Secondary | ICD-10-CM

## 2010-12-28 DIAGNOSIS — R51 Headache: Secondary | ICD-10-CM

## 2010-12-28 DIAGNOSIS — H6690 Otitis media, unspecified, unspecified ear: Secondary | ICD-10-CM

## 2010-12-28 DIAGNOSIS — R112 Nausea with vomiting, unspecified: Secondary | ICD-10-CM

## 2010-12-28 MED ORDER — ONDANSETRON HCL 4 MG PO TABS: 4.0000 mg | ORAL_TABLET | Freq: Three times a day (TID) | ORAL | Status: AC | PRN

## 2010-12-28 MED ORDER — DOXYCYCLINE HYCLATE 100 MG PO TABS: 100.0000 mg | ORAL_TABLET | Freq: Two times a day (BID) | ORAL | Status: DC

## 2010-12-28 MED ORDER — HYDROCODONE-ACETAMINOPHEN 7.5-325 MG PO TABS: 1.0000 | ORAL_TABLET | Freq: Four times a day (QID) | ORAL | Status: AC | PRN

## 2010-12-28 NOTE — Patient Instructions (Addendum)
Ear Infection, Easing the Pain Two out of 3 children will have an ear infection before their first birthday. Eight out of 10 will have had one or more ear infections before the age of 13. Behind the eardrum is a chamber (an empty space) called the middle ear. Inside this chamber is a series of three small bones. These bones send sound from the outer ear, through the eardrum, to the inner ear where the sound waves are converted into nerve impulses. These nerve impulses are sent to the brain to be "heard". It is when the middle ear becomes filled with fluid, and this fluid becomes infected with bacteria (germs) or viruses, that a child develops a middle ear infection. This problem is also called acute (sudden) otitis media. WHY DO MIDDLE EAR INFECTIONS OCCUR Middle ear infections are usually a complication of the common cold. The middle ear is connected to the back of the throat by a tunnel called the eustachian tube. With the congestion that accompanies a cold, the eustachian tubes become congested and blocked. This causes the air in the middle ears to become replaced with fluid. Because bacteria are often blocked in the middle ear as well, the fluid becomes infected and a middle ear infection results. CHILDREN ARE MORE SUSCEPTIBLE The eustachian tubes are narrower and shorter in younger children, so young children are more prone to ear infections. Younger children also get more colds. Therefore, ear infections occur most most often between the ages of 33 months and 35 years old. By age 18, half of all children have had three or more ear infections. The condition can also occur as a complication of allergies if the nose and ears become congested. Ear infections also occur without colds or allergies. Some children are prone to ear infections, and come from families with a history of ear infections. In many cases, these children have middle ears that chronically retain fluid because the eustachian tubes will not  stay open. COMMON SIGNS OF EAR INFECTION Pain is the most common sign of an ear infection, and fever is the second most common sign. The pain may be anything from mild to severe; depending on the child and perhaps the particular kind of bacteria that is causing the infection. The fever can run between 101 F (38.3 C) and 103 F (39.4 C), but it may be higher. Occasionally, children will have ear infection with no fever at all. The pain is typically worse when a child is lying down, so ear infections often first become known in the middle of the night. Infants and toddlers will not be able to say that their ears hurt, but they will:  Tug on their ears.   Put their hands over their ears.   Act fussy or agitated.  It can get confusing for parents because there are several other things that can cause ear pain, and there are lots of other things that can cause fever. For example, teething can cause ear pain, though it probably causes little or no fever. Lots of viruses cause fever, and in little children, viruses can commonly cause high fevers. With high fever, most young children are fretful, and it may be difficult for parents to tell whether anything, much less an ear, hurts. A good guideline for parents of children older than 3 months is to call the caregiver if a child has a fever and you suspect an earache. If it is nighttime and your child is not very ill appearing, the call (and the  visit to the caregiver's office) can wait until the next morning. For infants younger than 3 months, it is best to call your caregiver. SHOULD ANTIBIOTICS BE USED Without antibiotics, 80 percent of all ear infections get better within the week. Treating with an antibiotic only adds another ten percent to the cure rate - bringing it up to ninety percent. Of children with ear infections, 60 percent will be pain-free in 24 hours - whether or not they receive an antibiotic. Not giving antibiotics for mildly ill children  with ear infections is becoming more common in this country among physicians and parents. Results are similar whether or not an antibiotic is prescribed. By not giving an antibiotic you can save:  Expense   Inconvenience    Possibilities of allergic reactions to the medicine   Other side effects.     But, most importantly, giving less antibiotics decreases the likelihood of children developing infections with bacteria (germs) that have become resistant to the common antibiotics and are more difficult to treat. For children with earaches who are not very ill, parents can discuss with their children's caregivers whether an antibiotic is advisable or not. If physicians and parents choose to hold off on starting an antibiotic, they should have a plan to get back in touch with one another in 48 hours if the symptoms (fever and/or fussiness) have not resolved. EASING THE PAIN OF AN EAR INFECTION Whether or not a child is prescribed an antibiotic, parents will want to relieve their child's ear pain. Only take over-the-counter or prescription medicines for pain, discomfort, or fever as directed by your caregiver. Other ideas to help relieve pain include:  Applying a warm washcloth to the ear.   Keeping the child sitting up as much as possible.   Older children who are old enough to chew gum without swallowing it find that chewing gum helps ease their ear pain.  If the ear pain and fever are not getting better over the first 48 hours - whether or not an antibiotic has been prescribed - then you should get back in touch with your child's caregiver. For the child who has not been prescribed an antibiotic, the usual decision may be to prescribe one. For the child who is taking an antibiotic and is not getting better, it may be time to switch to a different antibiotic. The bacteria that is causing the ear infection may be resistant to the current medicine. ARE "TUBES" NEEDED TO DRAIN AND PREVENT LONG-STANDING  FLUID? Ear infections often develop after fluid has collected and is trapped in the middle ear. Trapped fluid in the middle ear is not only the cause of ear infections, but it is also the result. After an ear infection is treated - or gets better by itself - it takes a while for the eustachian tube to open up and get back to normal. Until then, the middle ear contains fluid, not air. In most children, this fluid is gone in 6 weeks, but in about 10 percent of children it will last three months or more. This is called otitis media with effusion. Regarding concerns about fluid in the middle ear:  First, some children are constantly prone to repeated ear infections - for the same reason that they got their first ear infections.   Second, with fluid in the middle ear, hearing is reduced, at least mildly. Over time, this can interfere with learning or with speech development. (The concern about interference with learning or speech development is  much less if the fluid has only been there for several months.)  Placement of ear tubes is an option when:  The child has had fluid in their middle ears for 4 to 6 months.   Has repeated ear infections.   Other measures fail to clear the fluid or prevent ear infections.   Tests show that the child is having significant hearing problems.  The "tubes" are small plastic tubes that are inserted through the eardrums by an ear, nose and throat (ENT) surgeon. Usually a general anesthetic is used. Tube placement is usually done in the hospital. These tubes usually stay in place for about 6 to 18 months. These tubes keep the middle ear filled with air rather than fluid. The tubes usually fall out by themselves. Some children need to have tubes inserted several times over the course of infancy and the preschool years. Insertion of ear tubes is one of the most common operations done by ENT surgeons. The placement of tubes is usually safe and most often they are effective in  restoring hearing and reducing the numbers of ear infections. But, like any operation (or medical treatment), parents ought to discuss with the caregiver:  Advantages and disadvantages   Possible risks and alternative treatments  PREVENTING EAR INFECTIONS There are several things within parents' control that they can do to reduce the numbers of ear infections that their children get.  Breastfeed - Children who are bottle-fed get more ear infections than those who are breast-fed. There are several infection-fighting components in breast milk, including antibody proteins and active white blood cells. Also, breast-fed infants are more likely to be fed in a semi-sitting position, so milk does not tend to collect around the openings of the eustachian tubes at the back of the nose and throat. If you bottle feed, do not prop the bottle. Feed your baby semi-sitting, rather than with him lying down. That will make sure that all the formula gets swallowed, and none collects back by the eustachian tubes. Never prop the bottle on a pillow or let your child feed himself or herself while laying down. This is a matter of safety as well as ear infection prevention.   Keep your home smoke free - Second-hand smoke in the home can increase ear infections in young children by 50 percent. It is an irritant to the upper respiratory passages, paving the way for infection into the middle ears.   Keep up with immunizations - Two immunizations, in particular, can reduce ear infections. The first is the infant pneumococcal vaccine, Prevnar. Infants usually get three doses of this vaccine in the first year of life and one booster dose after the first birthday. Although the main purpose of this vaccine is to prevent blood infections and meningitis, it also will reduce ear infections by about ten percent. Also, infants who get this vaccine are 20 percent less likely to need ventilation tubes in their ears. The other vaccine is the  flu vaccine. Although flu is not considered a common cause of ear infections, flu often causes the congestion that leads to an ear infection. In young children, about 20 percent of episodes of the flu are complicated by ear infection.   Since colds are the most common precursor of ear infections, avoiding frequent colds in young ear infection-prone children can be helpful. The first line of defense is good hand washing to prevent the spread of viruses, particularly when a family member is ill. Infants and toddlers in childcare settings  get more colds and ear infections than those at home. The more children in the childcare setting, the more infections. Choosing a childcare setting with fewer children can be helpful.  Document Released: 11/09/2001 Document Re-Released: 08/14/2009 [facility] Patient Information 2011 [place], [place]. Middle Ear Infection, Adult (Otitis Media, Adult) A middle ear infection is an infection in the space behind the eardrum. It often happens along with a cold. It is caused by a germ (bacteria) that starts growing in that space. Your neck may feel puffy (swollen) on the side of the ear infection. HOME CARE  Take all of your medicine even if you start to feel better.   Only take medicine as told by your doctor.   Medicine (nasal decongestant) may help the tube that connects the ear and throat (eustachian tube) drain better. It may also help with discomfort.   Follow up with your doctor in 10 to 14 days or as told by your doctor. This is to make sure the infection is gone.  GET HELP RIGHT AWAY IF:  You do not start to feel better in 2 to 3 days.   You have pain that is not helped with medicine.   You feel worse instead of better.   You cannot use the medicine as told.   You develop puffiness, redness, or pain around the ear.   You get a stiff neck.  MAKE SURE YOU:  Understand these instructions.   Will watch your condition.   Will get help right away if you  are not doing well or get worse.  Document Released: 11/06/2007 Document Re-Released: 11/07/2009 [facility] Patient Information 2011 [place], [place] .Headache and Allergies The relationship between allergies and headaches is unclear. Many people with allergic or infectious nasal problems also have headaches (migraines or sinus headaches). However, sometimes allergies can cause pressure that feels like a headache, and sometimes headaches can cause allergy-like symptoms. It is not always clear whether your symptoms are caused by allergies or by a headache. CAUSES  Migraine: The cause of a migraine is not always known.   Sinus Headache: The cause of a sinus headache may be a sinus infection. Other conditions that may be related to sinus headaches include:   Hay fever (allergic rhinitis).   Deviation of the nasal septum.   Swelling or clogging of the nasal passages.  SYMPTOMS Migraine headache symptoms (which often last 4 to 72 hours) include:  Intense, throbbing pain on one or both sides of the head.   Nausea.   Vomiting.   Being extra sensitive to light.   Being extra sensitive to sound.   Nervous system reactions that appear similar to an allergic reaction:   Stuffy nose.   Runny nose.   Tearing.  Sinus headaches are felt as facial pain or pressure.   DIAGNOSIS Because there is some overlap in symptoms, sinus and migraine headaches are often misdiagnosed. For example, a person with migraines may also feel facial pressure. Likewise, many people with hay fever may get migraine headaches rather than sinus headaches. These migraines can be triggered by the histamine release during an allergic reaction. An antihistamine medicine can eliminate this pain. There are standard criteria that help clarify the difference between these headaches and related allergy or allergy-like symptoms. Your caregiver can use these criteria to determine the proper diagnosis and provide you the best  care. TREATMENT Migraine medicine may help people who have persistent migraine headaches even though their hay fever is controlled. For some people, anti-inflammatory treatments  do not work to relieve migraines. Medicines called triptans (such as sumatriptan) can be helpful for those people. Document Released: 08/10/2003 Document Re-Released: 11/07/2009 [facility] Patient Information 2011 [place], [place]. Nausea and Vomiting Nausea is a sick feeling that often comes before throwing up (vomiting). Vomiting is a reflex where stomach contents come out of your mouth.  HOME CARE  Watch for signs of dryness (dehydration), such as:   Dry mouth and lips.   Fast breathing or heart rate.   Sunken eyes.   Drink enough water and fluids to keep the pee (urine) clear or pale yellow.   Drink clear fluids slowly. Drinking too much and/or too fast can cause you or your child to throw up.   Get plenty of rest.   Do not eat solid food until the vomiting has stopped.   Your doctor may give medicines that will help with nausea or vomiting. Take them as told.  GET HELP IF:  There are signs of dryness.   You were given medicines that are not helping, when taken as told by your doctor.  GET HELP RIGHT AWAY IF:  Vomiting gets worse.   There is blood in your vomit.   You have bloody watery poop (diarrhea).   You have a very bad headache (often when looking at light) or a stiff neck.   You have a temperature by mouth above 102, not controlled by medicine.   You feel confused and do not feel alert.   You develop very bad belly (abdominal) pain.  MAKE SURE YOU:  Understand these instructions.   Will watch this condition.   Will get help right away if you or your child is not doing well or gets worse.  Document Released: 11/06/2007 Document Re-Released: 08/14/2009 [facility] Patient Information 2011 [place], [place].

## 2011-01-10 ENCOUNTER — Ambulatory Visit: Payer: Self-pay

## 2011-03-15 LAB — I-STAT 8, (EC8 V) (CONVERTED LAB)
Acid-base deficit: 6 — ABNORMAL HIGH
Potassium: 4.6
TCO2: 18
pH, Ven: 7.421 — ABNORMAL HIGH

## 2011-03-15 LAB — URINALYSIS, MICROSCOPIC ONLY
Bilirubin Urine: NEGATIVE
Nitrite: NEGATIVE
Specific Gravity, Urine: 1.015
pH: 5.5

## 2011-03-15 LAB — PROTIME-INR: INR: 1.1

## 2011-03-15 LAB — CBC
HCT: 33.6 — ABNORMAL LOW
Hemoglobin: 11.4 — ABNORMAL LOW
MCHC: 34
RBC: 3.55 — ABNORMAL LOW

## 2011-03-15 LAB — RAPID URINE DRUG SCREEN, HOSP PERFORMED: Tetrahydrocannabinol: NOT DETECTED

## 2011-04-15 ENCOUNTER — Other Ambulatory Visit (INDEPENDENT_AMBULATORY_CARE_PROVIDER_SITE_OTHER): Payer: Self-pay

## 2011-04-15 ENCOUNTER — Other Ambulatory Visit: Payer: Self-pay | Admitting: Infectious Diseases

## 2011-04-15 DIAGNOSIS — E785 Hyperlipidemia, unspecified: Secondary | ICD-10-CM

## 2011-04-15 DIAGNOSIS — B20 Human immunodeficiency virus [HIV] disease: Secondary | ICD-10-CM

## 2011-04-15 LAB — COMPLETE METABOLIC PANEL WITH GFR
AST: 149 U/L — ABNORMAL HIGH (ref 0–37)
Albumin: 4.5 g/dL (ref 3.5–5.2)
BUN: 8 mg/dL (ref 6–23)
Calcium: 10.1 mg/dL (ref 8.4–10.5)
Chloride: 95 mEq/L — ABNORMAL LOW (ref 96–112)
GFR, Est Non African American: >89 mL/min/{1.73_m2}
Glucose, Bld: 74 mg/dL (ref 70–99)
Potassium: 4.3 mEq/L (ref 3.5–5.3)

## 2011-04-15 LAB — LIPID PANEL
Cholesterol: 213 mg/dL — ABNORMAL HIGH (ref 0–200)
VLDL: 12 mg/dL (ref 0–40)

## 2011-04-15 LAB — CBC WITH DIFFERENTIAL/PLATELET
HCT: 37.4 % (ref 36.0–46.0)
Hemoglobin: 12.9 g/dL (ref 12.0–15.0)
Lymphocytes Relative: 39 % (ref 12–46)
Monocytes Absolute: 0.5 10*3/uL (ref 0.1–1.0)
Monocytes Relative: 14 % — ABNORMAL HIGH (ref 3–12)
Neutro Abs: 1.6 10*3/uL — ABNORMAL LOW (ref 1.7–7.7)
WBC: 3.4 10*3/uL — ABNORMAL LOW (ref 4.0–10.5)

## 2011-04-16 LAB — T-HELPER CELL (CD4) - (RCID CLINIC ONLY): CD4 T Cell Abs: 370 uL — ABNORMAL LOW (ref 400–2700)

## 2011-04-17 LAB — HIV-1 RNA QUANT-NO REFLEX-BLD
HIV 1 RNA Quant: 9790 copies/mL — ABNORMAL HIGH (ref <20)
HIV-1 RNA Quant, Log: 3.99 {Log} — ABNORMAL HIGH (ref <1.30)

## 2011-04-29 ENCOUNTER — Encounter: Payer: Self-pay | Admitting: Infectious Disease

## 2011-04-29 ENCOUNTER — Ambulatory Visit (INDEPENDENT_AMBULATORY_CARE_PROVIDER_SITE_OTHER): Payer: Self-pay | Admitting: Infectious Disease

## 2011-04-29 DIAGNOSIS — R110 Nausea: Secondary | ICD-10-CM | POA: Insufficient documentation

## 2011-04-29 DIAGNOSIS — Z23 Encounter for immunization: Secondary | ICD-10-CM

## 2011-04-29 DIAGNOSIS — B1710 Acute hepatitis C without hepatic coma: Secondary | ICD-10-CM

## 2011-04-29 DIAGNOSIS — B20 Human immunodeficiency virus [HIV] disease: Secondary | ICD-10-CM

## 2011-04-29 MED ORDER — ONDANSETRON HCL 4 MG PO TABS: 4.0000 mg | ORAL_TABLET | ORAL | Status: AC

## 2011-04-29 MED ORDER — RITONAVIR 100 MG PO TABS
100.0000 mg | ORAL_TABLET | Freq: Every day | ORAL | Status: DC
Start: 2011-04-29 — End: 2011-06-10

## 2011-04-29 MED ORDER — EMTRICITABINE-TENOFOVIR DF 200-300 MG PO TABS: 1.0000 | ORAL_TABLET | Freq: Every day | ORAL | Status: DC

## 2011-04-29 MED ORDER — DARUNAVIR ETHANOLATE 400 MG PO TABS: 800.0000 mg | ORAL_TABLET | Freq: Every day | ORAL | Status: DC

## 2011-04-29 NOTE — Assessment & Plan Note (Signed)
Check viral load and genotype. Consider for referral to clinical trial [facility] at [facility]

## 2011-04-29 NOTE — Assessment & Plan Note (Signed)
Check genotype changed to prezista norvir and Truvada

## 2011-04-29 NOTE — Assessment & Plan Note (Signed)
Give Zofran for nausea likely to  be caused by antiretroviral regimen.

## 2011-04-29 NOTE — Progress Notes (Signed)
  Subjective:    Patient ID: SHAKIRA LOS, female    DOB: January 28, 1961, 50 y.o.   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 Her viral load earlier this month however was 9000 copies. She denies missing any doses. Unfortunately I told her that this likely means he she didn't have sufficient doses to acquire resistance and ability to change her antiretroviral regimen. I am adding HIV genotype to her blood work and I'm going to change her to [employer] and Truvada  Review of Systems  Constitutional: Negative for fever, chills, diaphoresis, activity change, appetite change, fatigue and unexpected weight change.  HENT: Negative for congestion, sore throat, rhinorrhea, sneezing, trouble swallowing and sinus pressure.   Eyes: Negative for photophobia and visual disturbance.  Respiratory: Negative for cough, chest tightness, shortness of breath, wheezing and stridor.   Cardiovascular: Negative for chest pain, palpitations and leg swelling.  Gastrointestinal: Negative for nausea, vomiting, abdominal pain, diarrhea, constipation, blood in stool, abdominal distention and anal bleeding.  Genitourinary: Negative for dysuria, hematuria, flank pain and difficulty urinating.  Musculoskeletal: Negative for myalgias, back pain, joint swelling, arthralgias and gait problem.  Skin: Negative for color change, pallor, rash and wound.  Neurological: Negative for dizziness, tremors, weakness and light-headedness.  Hematological: Negative for adenopathy. Does not bruise/bleed easily.  Psychiatric/Behavioral: Negative for behavioral problems, confusion, sleep disturbance, dysphoric mood, decreased concentration and agitation.       Objective:   Physical Exam  Constitutional: She is oriented to person, place, and time. She appears well-developed and well-nourished. No distress.  HENT:  Head: Normocephalic and atraumatic.    Mouth/Throat: Oropharynx is clear and moist. No oropharyngeal exudate.  Eyes: Conjunctivae and EOM are normal. Pupils are equal, round, and reactive to light. No scleral icterus.  Neck: Normal range of motion. Neck supple. No JVD present.  Cardiovascular: Normal rate, regular rhythm and normal heart sounds.  Exam reveals no gallop and no friction rub.   No murmur heard. Pulmonary/Chest: Effort normal and breath sounds normal. No respiratory distress. She has no wheezes. She has no rales. She exhibits no tenderness.  Abdominal: She exhibits no distension and no mass. There is no tenderness. There is no rebound and no guarding.  Musculoskeletal: She exhibits no edema and no tenderness.  Lymphadenopathy:    She has no cervical adenopathy.  Neurological: She is alert and oriented to person, place, and time. She has normal reflexes. She exhibits normal muscle tone. Coordination normal.  Skin: Skin is warm and dry. She is not diaphoretic. No erythema. No pallor.  Psychiatric: She has a normal mood and affect. Her behavior is normal. Judgment and thought content normal.          Assessment & Plan:  HIV DISEASE Check genotype changed to prezista norvir and Truvada  HEPATITIS C Check viral load and genotype. Consider for referral to clinical trial [facility]  Nausea Give Zofran for nausea likely to  be caused by antiretroviral regimen.

## 2011-04-29 NOTE — Patient Instructions (Signed)
I HAVE STOPPED THE ATRIPLA  PLEASE TAKE THE FOLLOWING PILLS RELIGIOUSLY  PREZISTA 400MG , : TAKE TWO (ORANGE) TABLETS FOR 800MG  DAILY WITH  NORVIR 100MG  (WHITE TABLET) DAILY  AND   TRUVADA (BLUE TABLET) ONCE DAILY  YOU MAY NEED TO TAKE ZOFRAN 4MG  AN HOUR BEFORE TAKING THESE MEDICINES AND UP TO FOUR TIMES DAILY IF NEEDED FOR NAUSEA DURING THE FIRST FEW WEEKS (YOU ALSO MAY NOT NEED IT)

## 2011-04-30 ENCOUNTER — Telehealth: Payer: Self-pay | Admitting: *Deleted

## 2011-04-30 NOTE — Telephone Encounter (Signed)
It should be on the $4 plan. Otherwise. Phenergan 25mg  taking one half to one whole tablet every 6 hours as needed is (or was on the plan)

## 2011-04-30 NOTE — Telephone Encounter (Signed)
Medicaid states her medicaid is in force & the drug is a preferred drug-no prior auth needed. I called [facility] back. Pt has family planning medicaid only. This covers limited services & meds. It does not cover ondansetron. To md too see if the med can be changed to something on the $4 plan

## 2011-04-30 NOTE — Telephone Encounter (Signed)
It is not on any $4 plan. I called the Phenergan in to [facility] [phone] #[phone] with 3 refills

## 2011-04-30 NOTE — Telephone Encounter (Signed)
I rec'd a fax asking for an alternative med. States the [place] Heath is too costly. I spoke with her & she has medicaid. I called [facility] on [address]. I asked for a prior auth form & contact numbers. He stated she has medicaid but this drug is not covered under her plan & a prior Berkley Harvey is not needed. The number he gave me is [phone]. The call told me to call [facility] [phone]

## 2011-05-01 NOTE — Telephone Encounter (Signed)
Thanks

## 2011-05-06 LAB — HEPATITIS C GENOTYPE

## 2011-05-08 LAB — HIV-1 GENOTYPR PLUS

## 2011-05-10 ENCOUNTER — Telehealth: Payer: Self-pay | Admitting: *Deleted

## 2011-05-10 NOTE — Telephone Encounter (Signed)
She wanted a letter from her md so she can apply for disability. Told her to apply & then they send the md a pack of papers to be done. She will go there & apply

## 2011-05-11 ENCOUNTER — Encounter (HOSPITAL_BASED_OUTPATIENT_CLINIC_OR_DEPARTMENT_OTHER): Payer: Self-pay | Admitting: *Deleted

## 2011-05-11 ENCOUNTER — Emergency Department (HOSPITAL_BASED_OUTPATIENT_CLINIC_OR_DEPARTMENT_OTHER)
Admission: EM | Admit: 2011-05-11 | Discharge: 2011-05-12 | Disposition: A | Discharge status: Home / Self Care | Payer: Self-pay | Attending: Emergency Medicine | Admitting: Emergency Medicine

## 2011-05-11 DIAGNOSIS — T148 Other injury of unspecified body region: Secondary | ICD-10-CM | POA: Insufficient documentation

## 2011-05-11 DIAGNOSIS — T148XXA Other injury of unspecified body region, initial encounter: Secondary | ICD-10-CM | POA: Insufficient documentation

## 2011-05-11 NOTE — ED Notes (Addendum)
Patient was given a dog 4 months ago, states it is not UTD on shots. Bit her tonight on the nose. Unable to tell me what type of dog. Patient states that she has been drinking this evening. Bleeding controlled at this time.

## 2011-05-11 NOTE — ED Notes (Signed)
Called for patient to be placed in a room and patient was not in the waiting room.

## 2011-06-10 ENCOUNTER — Encounter: Payer: Self-pay | Admitting: Infectious Disease

## 2011-06-10 ENCOUNTER — Ambulatory Visit: Payer: Self-pay

## 2011-06-10 ENCOUNTER — Other Ambulatory Visit: Payer: Self-pay | Admitting: Licensed Clinical Social Worker

## 2011-06-10 ENCOUNTER — Ambulatory Visit (INDEPENDENT_AMBULATORY_CARE_PROVIDER_SITE_OTHER): Payer: Self-pay | Admitting: Infectious Disease

## 2011-06-10 VITALS — BP 126/83 | HR 76 | Temp 97.8°F | Ht 67.0 in | Wt 146.0 lb

## 2011-06-10 DIAGNOSIS — B20 Human immunodeficiency virus [HIV] disease: Secondary | ICD-10-CM

## 2011-06-10 DIAGNOSIS — B1710 Acute hepatitis C without hepatic coma: Secondary | ICD-10-CM

## 2011-06-10 MED ORDER — EMTRICITABINE-TENOFOVIR DF 200-300 MG PO TABS: 1.0000 | ORAL_TABLET | Freq: Every day | ORAL | Status: DC

## 2011-06-10 MED ORDER — RITONAVIR 100 MG PO TABS: 100.0000 mg | ORAL_TABLET | Freq: Every day | ORAL | Status: DC

## 2011-06-10 MED ORDER — DARUNAVIR ETHANOLATE 800 MG PO TABS: 800.0000 mg | ORAL_TABLET | Freq: Every day | ORAL | Status: DC

## 2011-06-10 NOTE — Assessment & Plan Note (Signed)
Consider treatment in future first get her HIV controlled

## 2011-06-10 NOTE — Patient Instructions (Signed)
We need to make sure you have your new regimen which should be:  Prezista 800mg  tablet Norvir 100mg  tablet And truvada tablet   All once daily

## 2011-06-10 NOTE — Progress Notes (Signed)
  Subjective:    Patient ID: Barbara Henry, female    DOB: 1961-01-27, 51 y.o.   MRN: [id number]  HPI  51 year old African American female previously perfect was suppressed on her antiretroviral regimen of Atripla. Her viral load came up to  9000 copies. She denied missing any doses. Unfortunately I told her that this likely means he she didn't have sufficient doses to acquire resistance and ability to change her antiretroviral regimen. I added a HIV genotype to her blood work and I wrote to change  her to prezista norvir and Truvada. Genotype came back without resistance. She never got new meds and never contacted [place] about this. We are having her make sure her ADAP is uptodate and that she gets her meds. Otherwise she is doing well.   Review of Systems  Constitutional: Negative for fever, chills, diaphoresis, activity change, appetite change, fatigue and unexpected weight change.  HENT: Negative for congestion, sore throat, rhinorrhea, sneezing, trouble swallowing and sinus pressure.   Eyes: Negative for photophobia and visual disturbance.  Respiratory: Negative for cough, chest tightness, shortness of breath, wheezing and stridor.   Cardiovascular: Negative for chest pain, palpitations and leg swelling.  Gastrointestinal: Negative for nausea, vomiting, abdominal pain, diarrhea, constipation, blood in stool, abdominal distention and anal bleeding.  Genitourinary: Negative for dysuria, hematuria, flank pain and difficulty urinating.  Musculoskeletal: Negative for myalgias, back pain, joint swelling, arthralgias and gait problem.  Skin: Negative for color change, pallor, rash and wound.  Neurological: Negative for dizziness, tremors, weakness and light-headedness.  Hematological: Negative for adenopathy. Does not bruise/bleed easily.  Psychiatric/Behavioral: Negative for behavioral problems, confusion, sleep disturbance, dysphoric mood, decreased concentration and agitation.       Objective:     Physical Exam  Constitutional: She is oriented to person, place, and time. She appears well-developed and well-nourished. No distress.  HENT:  Head: Normocephalic and atraumatic.  Mouth/Throat: Oropharynx is clear and moist. No oropharyngeal exudate.  Eyes: Conjunctivae and EOM are normal. Pupils are equal, round, and reactive to light. No scleral icterus.  Neck: Normal range of motion. Neck supple. No JVD present.  Cardiovascular: Normal rate, regular rhythm and normal heart sounds.  Exam reveals no gallop and no friction rub.   No murmur heard. Pulmonary/Chest: Effort normal and breath sounds normal. No respiratory distress. She has no wheezes. She has no rales. She exhibits no tenderness.  Abdominal: She exhibits no distension and no mass. There is no tenderness. There is no rebound and no guarding.  Musculoskeletal: She exhibits no edema and no tenderness.  Lymphadenopathy:    She has no cervical adenopathy.  Neurological: She is alert and oriented to person, place, and time. She has normal reflexes. She exhibits normal muscle tone. Coordination normal.  Skin: Skin is warm and dry. She is not diaphoretic. No erythema. No pallor.  Psychiatric: She has a normal mood and affect. Her behavior is normal. Judgment and thought content normal.          Assessment & Plan:  HIV DISEASE Make sure her ADAP uptodate and bring back in month for recheck viral load cd4, check cd4 today to see if needs PCP prophylaxis  HEPATITIS C Consider treatment in future first get her HIV controlled

## 2011-06-10 NOTE — Assessment & Plan Note (Signed)
Make sure her ADAP uptodate and bring back in month for recheck viral load cd4, check cd4 today to see if needs PCP prophylaxis

## 2011-06-25 ENCOUNTER — Other Ambulatory Visit: Payer: Self-pay | Admitting: *Deleted

## 2011-06-25 DIAGNOSIS — B1710 Acute hepatitis C without hepatic coma: Secondary | ICD-10-CM

## 2011-06-25 DIAGNOSIS — B20 Human immunodeficiency virus [HIV] disease: Secondary | ICD-10-CM

## 2011-06-25 MED ORDER — RITONAVIR 100 MG PO TABS
100.0000 mg | ORAL_TABLET | Freq: Every day | ORAL | Status: DC
Start: 2011-06-25 — End: 2011-11-18

## 2011-06-25 MED ORDER — EMTRICITABINE-TENOFOVIR DF 200-300 MG PO TABS: 1.0000 | ORAL_TABLET | Freq: Every day | ORAL | Status: DC

## 2011-06-25 MED ORDER — DARUNAVIR ETHANOLATE 800 MG PO TABS: 800.0000 mg | ORAL_TABLET | Freq: Every day | ORAL | Status: DC

## 2011-06-25 NOTE — Telephone Encounter (Signed)
rxes sent to [facility], ADAP

## 2011-07-02 ENCOUNTER — Other Ambulatory Visit: Payer: Self-pay | Admitting: Infectious Disease

## 2011-07-02 DIAGNOSIS — Z113 Encounter for screening for infections with a predominantly sexual mode of transmission: Secondary | ICD-10-CM

## 2011-07-16 DIAGNOSIS — R112 Nausea with vomiting, unspecified: Secondary | ICD-10-CM | POA: Insufficient documentation

## 2011-07-16 DIAGNOSIS — H6692 Otitis media, unspecified, left ear: Secondary | ICD-10-CM | POA: Insufficient documentation

## 2011-07-16 NOTE — Progress Notes (Signed)
Sick Call  Subjective: Barbara Henry is an 51 y.o. female.   Patient complains of 3 days of headache and right ear pain, pressure, dizziness is not associated with head movements . Does complain of nausea and vomiting, along with the headaches and right ear pain.  Objective: HEENT: Right TM erythematous, bulging, with fluid noted behind. Left TM clear. Cardio: RRR Resp: CTA B/L Skin:   Intact Neuro: Alert/Oriented, Cranial Nerve II-XII normal, Normal Sensory and Normal Motor  Assessment: otitis media  Plan: Otitis media of left ear Doxycycline 100 mg twice a day x10 days  Headache Vicodin tabs, 1 by mouth every 6 hours when necessary.  Nausea & vomiting Ondansetron one by mouth every 6 hours when necessary.    Myrel Rappleye A. Sundra Aland, MS, [facility] [phone]  07/16/2011, 10:40 AM

## 2011-07-16 NOTE — Assessment & Plan Note (Signed)
Vicodin tabs, 1 by mouth every 6 hours when necessary.

## 2011-07-16 NOTE — Assessment & Plan Note (Signed)
Doxycycline 100 mg twice a day x10 days

## 2011-07-16 NOTE — Assessment & Plan Note (Signed)
Ondansetron one by mouth every 6 hours when necessary.

## 2011-07-25 ENCOUNTER — Other Ambulatory Visit (INDEPENDENT_AMBULATORY_CARE_PROVIDER_SITE_OTHER): Payer: Self-pay

## 2011-07-25 DIAGNOSIS — B20 Human immunodeficiency virus [HIV] disease: Secondary | ICD-10-CM

## 2011-07-25 DIAGNOSIS — Z113 Encounter for screening for infections with a predominantly sexual mode of transmission: Secondary | ICD-10-CM

## 2011-07-25 DIAGNOSIS — Z79899 Other long term (current) drug therapy: Secondary | ICD-10-CM

## 2011-07-25 LAB — COMPLETE METABOLIC PANEL WITH GFR
ALT: 184 U/L — ABNORMAL HIGH (ref 0–35)
AST: 486 U/L — ABNORMAL HIGH (ref 0–37)
CO2: 24 mEq/L (ref 19–32)
Calcium: 9.8 mg/dL (ref 8.4–10.5)
Chloride: 100 mEq/L (ref 96–112)
GFR, Est African American: >89 mL/min
Potassium: 4.4 mEq/L (ref 3.5–5.3)
Sodium: 136 mEq/L (ref 135–145)
Total Protein: 8.5 g/dL — ABNORMAL HIGH (ref 6.0–8.3)

## 2011-07-25 LAB — CBC WITH DIFFERENTIAL/PLATELET
Basophils Absolute: 0 10*3/uL (ref 0.0–0.1)
Lymphocytes Relative: 32 % (ref 12–46)
Lymphs Abs: 0.9 10*3/uL (ref 0.7–4.0)
MCV: 95.4 fL (ref 78.0–100.0)
Neutro Abs: 1.4 10*3/uL — ABNORMAL LOW (ref 1.7–7.7)
Neutrophils Relative %: 48 % (ref 43–77)
Platelets: 144 10*3/uL — ABNORMAL LOW (ref 150–400)
RBC: 3.9 MIL/uL (ref 3.87–5.11)
WBC: 2.9 10*3/uL — ABNORMAL LOW (ref 4.0–10.5)

## 2011-07-25 LAB — RPR

## 2011-07-25 LAB — LIPID PANEL
LDL Cholesterol: 98 mg/dL (ref 0–99)
VLDL: 11 mg/dL (ref 0–40)

## 2011-07-26 LAB — T-HELPER CELL (CD4) - (RCID CLINIC ONLY): CD4 T Cell Abs: 240 uL — ABNORMAL LOW (ref 400–2700)

## 2011-07-26 LAB — GC/CHLAMYDIA PROBE AMP, URINE: GC Probe Amp, Urine: NEGATIVE

## 2011-07-29 LAB — HIV-1 RNA QUANT-NO REFLEX-BLD: HIV-1 RNA Quant, Log: 4.13 {Log} — ABNORMAL HIGH (ref <1.30)

## 2011-08-08 ENCOUNTER — Encounter: Payer: Self-pay | Admitting: Infectious Disease

## 2011-08-08 ENCOUNTER — Ambulatory Visit (INDEPENDENT_AMBULATORY_CARE_PROVIDER_SITE_OTHER): Payer: Self-pay | Admitting: Infectious Disease

## 2011-08-08 VITALS — BP 119/85 | HR 97 | Temp 98.2°F | Wt 142.0 lb

## 2011-08-08 DIAGNOSIS — B1710 Acute hepatitis C without hepatic coma: Secondary | ICD-10-CM

## 2011-08-08 DIAGNOSIS — J309 Allergic rhinitis, unspecified: Secondary | ICD-10-CM

## 2011-08-08 DIAGNOSIS — B20 Human immunodeficiency virus [HIV] disease: Secondary | ICD-10-CM

## 2011-08-08 NOTE — Assessment & Plan Note (Signed)
Get meds to her tomorrow, bring back in month for recheck of labs and visit in 6 wks

## 2011-08-08 NOTE — Assessment & Plan Note (Signed)
Mentioned idea of clinica trial in [facility]. She will consider

## 2011-08-08 NOTE — Assessment & Plan Note (Signed)
seasaonal allergies flaring but tolerable

## 2011-08-08 NOTE — Progress Notes (Signed)
  Subjective:    Patient ID: Barbara Henry, female    DOB: 08/15/1960, 51 y.o.   MRN: [id number]  HPI  51 year old African American female previously perfect was suppressed on her antiretroviral regimen of Atripla. Her viral load came up to 9000 copies. I changed her to prezista norvir and truvada but the meds were never delivered to her and she did not get into contact withus. We called [facility] and meds will be delivered tomorrow. I reviewed her new regimen how to take it and side effects. She is otherwise doing well.   Review of Systems  Constitutional: Negative for fever, chills, diaphoresis, activity change, appetite change, fatigue and unexpected weight change.  HENT: Negative for congestion, sore throat, rhinorrhea, sneezing, trouble swallowing and sinus pressure.   Eyes: Negative for photophobia and visual disturbance.  Respiratory: Negative for cough, chest tightness, shortness of breath, wheezing and stridor.   Cardiovascular: Negative for chest pain, palpitations and leg swelling.  Gastrointestinal: Negative for nausea, vomiting, abdominal pain, diarrhea, constipation, blood in stool, abdominal distention and anal bleeding.  Genitourinary: Negative for dysuria, hematuria, flank pain and difficulty urinating.  Musculoskeletal: Negative for myalgias, back pain, joint swelling, arthralgias and gait problem.  Skin: Negative for color change, pallor, rash and wound.  Neurological: Negative for dizziness, tremors, weakness and light-headedness.  Hematological: Negative for adenopathy. Does not bruise/bleed easily.  Psychiatric/Behavioral: Negative for behavioral problems, confusion, sleep disturbance, dysphoric mood, decreased concentration and agitation.       Objective:   Physical Exam  Constitutional: She is oriented to person, place, and time. She appears well-developed and well-nourished. No distress.  HENT:  Head: Normocephalic and atraumatic.  Mouth/Throat: Oropharynx is  clear and moist. No oropharyngeal exudate.  Eyes: Conjunctivae and EOM are normal. Pupils are equal, round, and reactive to light. No scleral icterus.  Neck: Normal range of motion. Neck supple. No JVD present.  Cardiovascular: Normal rate, regular rhythm and normal heart sounds.  Exam reveals no gallop and no friction rub.   No murmur heard. Pulmonary/Chest: Effort normal and breath sounds normal. No respiratory distress. She has no wheezes. She has no rales. She exhibits no tenderness.  Abdominal: She exhibits no distension and no mass. There is no tenderness. There is no rebound and no guarding.  Musculoskeletal: She exhibits no edema and no tenderness.  Lymphadenopathy:    She has no cervical adenopathy.  Neurological: She is alert and oriented to person, place, and time. She has normal reflexes. She exhibits normal muscle tone. Coordination normal.  Skin: Skin is warm and dry. She is not diaphoretic. No erythema. No pallor.  Psychiatric: She has a normal mood and affect. Her behavior is normal. Judgment and thought content normal.          Assessment & Plan:  HIV DISEASE Get meds to her tomorrow, bring back in month for recheck of labs and visit in 6 wks  HEPATITIS C Mentioned idea of clinica trial in [facility]. She will consider  ALLERGIC RHINITIS seasaonal allergies flaring but tolerable

## 2011-08-30 ENCOUNTER — Encounter (HOSPITAL_COMMUNITY): Payer: Self-pay | Admitting: *Deleted

## 2011-08-30 ENCOUNTER — Emergency Department (HOSPITAL_COMMUNITY): Payer: Self-pay

## 2011-08-30 ENCOUNTER — Emergency Department (HOSPITAL_COMMUNITY)
Admission: EM | Admit: 2011-08-30 | Discharge: 2011-08-31 | Disposition: A | Discharge status: Home / Self Care | Payer: Self-pay | Attending: Emergency Medicine | Admitting: Emergency Medicine

## 2011-08-30 ENCOUNTER — Other Ambulatory Visit: Payer: Self-pay

## 2011-08-30 DIAGNOSIS — S0990XA Unspecified injury of head, initial encounter: Secondary | ICD-10-CM | POA: Insufficient documentation

## 2011-08-30 DIAGNOSIS — S098XXA Other specified injuries of head, initial encounter: Secondary | ICD-10-CM

## 2011-08-30 DIAGNOSIS — F10929 Alcohol use, unspecified with intoxication, unspecified: Secondary | ICD-10-CM

## 2011-08-30 DIAGNOSIS — M549 Dorsalgia, unspecified: Secondary | ICD-10-CM | POA: Insufficient documentation

## 2011-08-30 DIAGNOSIS — IMO0002 Reserved for concepts with insufficient information to code with codable children: Secondary | ICD-10-CM | POA: Insufficient documentation

## 2011-08-30 DIAGNOSIS — M542 Cervicalgia: Secondary | ICD-10-CM | POA: Insufficient documentation

## 2011-08-30 DIAGNOSIS — R51 Headache: Secondary | ICD-10-CM | POA: Insufficient documentation

## 2011-08-30 DIAGNOSIS — W06XXXA Fall from bed, initial encounter: Secondary | ICD-10-CM | POA: Insufficient documentation

## 2011-08-30 DIAGNOSIS — F1010 Alcohol abuse, uncomplicated: Secondary | ICD-10-CM | POA: Insufficient documentation

## 2011-08-30 MED ORDER — SODIUM CHLORIDE 0.9 % IV BOLUS (SEPSIS)
500.0000 mL | Freq: Once | INTRAVENOUS | Status: AC
  Administered 2011-08-31: 500 mL via INTRAVENOUS

## 2011-08-30 NOTE — ED Notes (Signed)
Pt fell from bed tonight, strong smell of ETOH.  Friend states 3-4 weeks of CP.  Pt sleeping at this time, no c/o pain.

## 2011-08-30 NOTE — ED Notes (Signed)
[phone], sister Samson Frederic, would like to be called if pt is to be d/c home.  Will pick up

## 2011-08-30 NOTE — ED Notes (Signed)
Patient sleeping at this time, patient in [place], patient in NAD

## 2011-08-30 NOTE — ED Notes (Signed)
Patient fell out of bed tonight, approx 3 feet, patient with visible around gumline, not bleeding at time of triage, patient c/o chest pain.  Patient states chest pain x 3 weeks, patient with smell of ETOH.

## 2011-08-31 ENCOUNTER — Emergency Department (HOSPITAL_COMMUNITY): Payer: Self-pay

## 2011-08-31 LAB — PROTIME-INR
INR: 1.06 (ref 0.00–1.49)
Prothrombin Time: 14 s (ref 11.6–15.2)

## 2011-08-31 LAB — DIFFERENTIAL
Basophils Absolute: 0.1 K/uL (ref 0.0–0.1)
Basophils Relative: 3 % — ABNORMAL HIGH (ref 0–1)
Eosinophils Absolute: 0.1 K/uL (ref 0.0–0.7)
Eosinophils Relative: 3 % (ref 0–5)
Lymphocytes Relative: 56 % — ABNORMAL HIGH (ref 12–46)
Lymphs Abs: 1.5 K/uL (ref 0.7–4.0)
Monocytes Absolute: 0.2 K/uL (ref 0.1–1.0)
Monocytes Relative: 8 % (ref 3–12)
Neutro Abs: 0.8 K/uL — ABNORMAL LOW (ref 1.7–7.7)
Neutrophils Relative %: 30 % — ABNORMAL LOW (ref 43–77)

## 2011-08-31 LAB — CBC
HCT: 35.9 % — ABNORMAL LOW (ref 36.0–46.0)
Hemoglobin: 12.6 g/dL (ref 12.0–15.0)
MCH: 32.1 pg (ref 26.0–34.0)
MCHC: 35.1 g/dL (ref 30.0–36.0)
MCV: 91.3 fL (ref 78.0–100.0)
Platelets: 140 K/uL — ABNORMAL LOW (ref 150–400)
RBC: 3.93 MIL/uL (ref 3.87–5.11)
RDW: 14.4 % (ref 11.5–15.5)
WBC: 2.7 K/uL — ABNORMAL LOW (ref 4.0–10.5)

## 2011-08-31 LAB — CARDIAC PANEL(CRET KIN+CKTOT+MB+TROPI)
Relative Index: 1.6 (ref 0.0–2.5)
Troponin I: <0.3 ng/mL (ref <0.30)

## 2011-08-31 LAB — BASIC METABOLIC PANEL
Calcium: 8.9 mg/dL (ref 8.4–10.5)
GFR calc Af Amer: >90 mL/min (ref >90)
GFR calc non Af Amer: >90 mL/min (ref >90)
Glucose, Bld: 77 mg/dL (ref 70–99)
Potassium: 3.7 mEq/L (ref 3.5–5.1)
Sodium: 142 mEq/L (ref 135–145)

## 2011-08-31 LAB — APTT: aPTT: 36 s (ref 24–37)

## 2011-08-31 MED ORDER — SODIUM CHLORIDE 0.9 % IV BOLUS (SEPSIS): 1000.0000 mL | Freq: Once | INTRAVENOUS | Status: DC

## 2011-08-31 NOTE — ED Notes (Signed)
Pt has pulled IV out in attempt to get up and go to bathroom. Pt is being discharged. Sister called for ride for patient.

## 2011-08-31 NOTE — ED Provider Notes (Signed)
History     CSN: [id number]  Arrival date & time 08/30/11  2256   First MD Initiated Contact with Patient 08/30/11 2303      Chief Complaint  Patient presents with  . Fall    patient fell out of bed, blood visible in mouth    (Consider location/radiation/quality/duration/timing/severity/associated sxs/prior treatment) HPI Pt is poor historian. She admits to drinking etOH tonight. A friend reports that the pt fell out of bed and has been c/o chest pain for several weeks. He is not at bedside for further questioning. Pt does not remember the event and can give no details. She only complains about lumbar pain. Specifically, denies chest pain. Placed in c-collar  History reviewed. No pertinent past medical history.  History reviewed. No pertinent past surgical history.  No family history on file.  History  Substance Use Topics  . Smoking status: Never Smoker   . Smokeless tobacco: Never Used  . Alcohol Use: 1.0 oz/week    2 drink(s) per week    OB History    Grav Para Term Preterm Abortions TAB SAB Ect Mult Living                  Review of Systems  HENT: Negative for neck pain.   Gastrointestinal: Negative for nausea, vomiting and abdominal pain.  Musculoskeletal: Positive for back pain.  Skin: Positive for wound.  Neurological: Negative for weakness and headaches.  Psychiatric/Behavioral: Positive for agitation.    Allergies  Review of patient's allergies indicates no known allergies.  Home Medications   Current Outpatient Rx  Name Route Sig Dispense Refill  . ASPIRIN 325 MG PO TABS Oral Take 325 mg by mouth once.      Marland Kitchen DARUNAVIR ETHANOLATE 800 MG PO TABS Oral Take 800 mg by mouth daily. 30 tablet 11  . DOXYCYCLINE HYCLATE 100 MG PO TABS Oral Take 1 tablet (100 mg total) by mouth 2 (two) times daily. 20 tablet 0  . EMTRICITABINE-TENOFOVIR 200-300 MG PO TABS Oral Take 1 tablet by mouth daily. 30 tablet 11  . RITONAVIR 100 MG PO TABS Oral Take 1 tablet (100 mg  total) by mouth daily. 30 tablet 11    BP 122/78  Pulse 101  Temp(Src) 98 F (36.7 C) (Oral)  Resp 18  SpO2 99%  Physical Exam  Nursing note and vitals reviewed. Constitutional: She appears well-developed and well-nourished. No distress.       Pt with obvious smell of EtOH on her breath  HENT:  Head: Normocephalic.  Mouth/Throat: Oropharynx is clear and moist.       Small amount of dried blood on lower lip without definite source or active bleeding  Eyes: EOM are normal. Pupils are equal, round, and reactive to light.  Neck:       c-collar in place  Cardiovascular: Normal rate and regular rhythm.   Pulmonary/Chest: Effort normal and breath sounds normal. No respiratory distress. She has no wheezes. She has no rales.  Abdominal: Soft. Bowel sounds are normal. There is no tenderness. There is no rebound and no guarding.  Musculoskeletal: Normal range of motion. She exhibits tenderness (high lumbar ttp without step off or obvious deformity). She exhibits no edema.  Neurological:       Intoxicate and belligerent. Intermittently following commands and answering questions Moves all ext. No gross deficits  Skin: Skin is warm and dry. No rash noted. No erythema.    ED Course  Procedures (including critical care time)  Labs  Reviewed  CBC - Abnormal; Notable for the following:    WBC 2.7 (*)    HCT 35.9 (*)    Platelets 140 (*)    All other components within normal limits  DIFFERENTIAL - Abnormal; Notable for the following:    Neutrophils Relative 30 (*)    Lymphocytes Relative 56 (*)    Basophils Relative 3 (*)    Neutro Abs 0.8 (*)    All other components within normal limits  BASIC METABOLIC PANEL - Abnormal; Notable for the following:    BUN 4 (*)    Creatinine, Ser 0.47 (*)    All other components within normal limits  ETHANOL - Abnormal; Notable for the following:    Alcohol, Ethyl (B) 531 (*)    All other components within normal limits  PROTIME-INR  APTT  CARDIAC  PANEL(CRET KIN+CKTOT+MB+TROPI)  URINE RAPID DRUG SCREEN (HOSP PERFORMED)   Dg Lumbar Spine Complete  08/31/2011  *RADIOLOGY REPORT*  Clinical Data: Back pain.  No known injury.  The patient in C- collar.  LUMBAR SPINE - COMPLETE 4+ VIEW  Comparison: CT abdomen and pelvis 08/25/2008.  Pelvic radiographs 08/25/2008  Findings: Five lumbar type vertebra.  Normal alignment of the lumbar vertebra and facet joints.  Intervertebral disc space heights are preserved.  No vertebral compression deformities.  No focal bone lesion or bone destruction.  Bone cortex and trabecular architecture appear intact.  IMPRESSION: No displaced fractures identified.  Original Report Authenticated By: Marlon Pel, M.D.   Ct Head Wo Contrast  08/31/2011  *RADIOLOGY REPORT*  Clinical Data:  The patient fell from bed.  Posterior headache and left sided neck pain  CT HEAD WITHOUT CONTRAST CT CERVICAL SPINE WITHOUT CONTRAST  Technique:  Multidetector CT imaging of the head and cervical spine was performed following the standard protocol without intravenous contrast.  Multiplanar CT image reconstructions of the cervical spine were also generated.  Comparison:  CT head and cervical 08/25/2008  CT HEAD  Findings: Mild diffuse cerebral atrophy.  Mild ventricular dilatation consistent with central atrophy.  No mass effect or midline shift.  Gray-white matter junctions are distinct.  Basal cisterns are not effaced.  No evidence of acute intracranial hemorrhage.  Small subcutaneous scalp hematoma over the left frontal and right temporal region.  No underlying depressed skull fractures.  Visualized mastoid air cells and paranasal sinuses are not opacified.  No significant changes since the previous study. Incidental note of peri apical lucencies in the left maxillary teeth consistent with periodontal disease.  IMPRESSION: Chronic cerebral atrophy.  No acute intracranial abnormalities.  CT CERVICAL SPINE  Findings: Curvature of the cervical  spine convex towards the left with head leaning toward the right.  This may be positional or could represent ligamentous injury or muscle spasm.  Otherwise normal alignment of the cervical vertebrae and facet joints. Intervertebral disc space heights are mostly preserved.  Mild endplate hypertrophic changes in the mid cervical region.  No vertebral compression deformities.  Vertebral bone cortex and trabecular architecture are preserved.  No focal bone lesion or bone destruction.  No prevertebral soft tissue swelling. No paraspinal soft tissue infiltration.  IMPRESSION: Curvature of the cervical spine convex towards the left with head tilted and rotated towards the right.  This may be due to patient positioning but muscle spasm or ligamentous injury can also have this appearance.  Clinical correlation is recommended.  No displaced fractures appreciated.  Original Report Authenticated By: Marlon Pel, M.D.   Ct Cervical Spine  Wo Contrast  08/31/2011  *RADIOLOGY REPORT*  Clinical Data:  The patient fell from bed.  Posterior headache and left sided neck pain  CT HEAD WITHOUT CONTRAST CT CERVICAL SPINE WITHOUT CONTRAST  Technique:  Multidetector CT imaging of the head and cervical spine was performed following the standard protocol without intravenous contrast.  Multiplanar CT image reconstructions of the cervical spine were also generated.  Comparison:  CT head and cervical 08/25/2008  CT HEAD  Findings: Mild diffuse cerebral atrophy.  Mild ventricular dilatation consistent with central atrophy.  No mass effect or midline shift.  Gray-white matter junctions are distinct.  Basal cisterns are not effaced.  No evidence of acute intracranial hemorrhage.  Small subcutaneous scalp hematoma over the left frontal and right temporal region.  No underlying depressed skull fractures.  Visualized mastoid air cells and paranasal sinuses are not opacified.  No significant changes since the previous study. Incidental note  of peri apical lucencies in the left maxillary teeth consistent with periodontal disease.  IMPRESSION: Chronic cerebral atrophy.  No acute intracranial abnormalities.  CT CERVICAL SPINE  Findings: Curvature of the cervical spine convex towards the left with head leaning toward the right.  This may be positional or could represent ligamentous injury or muscle spasm.  Otherwise normal alignment of the cervical vertebrae and facet joints. Intervertebral disc space heights are mostly preserved.  Mild endplate hypertrophic changes in the mid cervical region.  No vertebral compression deformities.  Vertebral bone cortex and trabecular architecture are preserved.  No focal bone lesion or bone destruction.  No prevertebral soft tissue swelling. No paraspinal soft tissue infiltration.  IMPRESSION: Curvature of the cervical spine convex towards the left with head tilted and rotated towards the right.  This may be due to patient positioning but muscle spasm or ligamentous injury can also have this appearance.  Clinical correlation is recommended.  No displaced fractures appreciated.  Original Report Authenticated By: Marlon Pel, M.D.   Dg Chest Port 1 View  08/31/2011  *RADIOLOGY REPORT*  Clinical Data: Chest pain.  Altered mental status.  PORTABLE CHEST - 1 VIEW  Comparison: 09/27/2008  Findings: Heart and mediastinal contours are within normal limits. No focal opacities or effusions.  No acute bony abnormality.  IMPRESSION: No active cardiopulmonary disease.  Original Report Authenticated By: Cyndie Chime, M.D.     1. Alcohol intoxication   2. Blunt head injury      Date: 08/31/2011  Rate: 90  Rhythm: normal sinus rhythm  QRS Axis: normal  Intervals: normal  ST/T Wave abnormalities: normal  Conduction Disutrbances:none  Narrative Interpretation:   Old EKG Reviewed: none available    MDM  Pt now alert. No slurring words. C-spine cleared. Will d/c home with family.         Loren Racer, MD 08/31/11 [phone]

## 2011-08-31 NOTE — Discharge Instructions (Signed)
Alcohol Intoxication Alcohol intoxication means your blood alcohol level is above legal limits. Alcohol is a drug. It has serious side effects. These side effects can include:  Damage to your organs (liver, nervous system, and blood system).   Unclear thinking.   Slowed reflexes.   Decreased muscle coordination.  HOME CARE  Do not drink and drive.   Do not drink alcohol if you are taking medicine or using other drugs. Doing so can cause serious medical problems or even death.   Drink enough water and fluids to keep your pee (urine) clear or pale yellow.   Eat healthy foods.   Only take medicine as told by your doctor.   Join an alcohol support group.  GET HELP RIGHT AWAY IF:  You become shaky when you stop drinking.   Your thinking is unclear or you become confused.   You throw up (vomit) blood. It may look bright red or like coffee grounds.   You notice blood in your poop (bowel movements).   You become lightheaded or pass out (faint).  MAKE SURE YOU:   Understand these instructions.   Will watch your condition.   Will get help right away if you are not doing well or get worse.  Document Released: 11/06/2007 Document Revised: 05/09/2011 Document Reviewed: 11/06/2009 [facility] Patient Information 2012 [place], [place].Head Injury, Adult You have had a head injury that does not appear serious at this time. A concussion is a state of changed mental ability, usually from a blow to the head. You should take clear liquids for the rest of the day and then resume your regular diet. You should not take sedatives or alcoholic beverages for as long as directed by your caregiver after discharge. After injuries such as yours, most problems occur within the first 24 hours. SYMPTOMS These minor symptoms may be experienced after discharge:  Memory difficulties.   Dizziness.   Headaches.   Double vision.   Hearing difficulties.   Depression.   Tiredness.   Weakness.    Difficulty with concentration.  If you experience any of these problems, you should not be alarmed. A concussion requires a few days for recovery. Many patients with head injuries frequently experience such symptoms. Usually, these problems disappear without medical care. If symptoms last for more than one day, notify your caregiver. See your caregiver sooner if symptoms are becoming worse rather than better. HOME CARE INSTRUCTIONS   During the next 24 hours you must stay with someone who can watch you for the warning signs listed below.  Although it is unlikely that serious side effects will occur, you should be aware of signs and symptoms which may necessitate your return to this location. Side effects may occur up to 7 - 10 days following the injury. It is important for you to carefully monitor your condition and contact your caregiver or seek immediate medical attention if there is a change in your condition. SEEK IMMEDIATE MEDICAL CARE IF:   There is confusion or drowsiness.   You can not awaken the injured person.   There is nausea (feeling sick to your stomach) or continued, forceful vomiting.   You notice dizziness or unsteadiness which is getting worse, or inability to walk.   You have convulsions or unconsciousness.   You experience severe, persistent headaches not relieved by over-the-counter or prescription medicines for pain. (Do not take aspirin as this impairs clotting abilities). Take other pain medications only as directed.   You can not use arms or legs  normally.   There is clear or bloody discharge from the nose or ears.  MAKE SURE YOU:   Understand these instructions.   Will watch your condition.   Will get help right away if you are not doing well or get worse.  Document Released: 05/20/2005 Document Revised: 05/09/2011 Document Reviewed: 04/07/2009 [facility] Patient Information 2012 [place], [place].

## 2011-09-02 LAB — PATHOLOGIST SMEAR REVIEW

## 2011-10-02 ENCOUNTER — Other Ambulatory Visit: Payer: Self-pay

## 2011-10-02 DIAGNOSIS — B20 Human immunodeficiency virus [HIV] disease: Secondary | ICD-10-CM

## 2011-10-02 LAB — CBC WITH DIFFERENTIAL/PLATELET
Eosinophils Absolute: 0 10*3/uL (ref 0.0–0.7)
Eosinophils Relative: 1 % (ref 0–5)
HCT: 34 % — ABNORMAL LOW (ref 36.0–46.0)
Lymphocytes Relative: 38 % (ref 12–46)
Lymphs Abs: 1.2 10*3/uL (ref 0.7–4.0)
MCH: 32.9 pg (ref 26.0–34.0)
MCV: 98.3 fL (ref 78.0–100.0)
Monocytes Absolute: 0.4 10*3/uL (ref 0.1–1.0)
Monocytes Relative: 13 % — ABNORMAL HIGH (ref 3–12)
RBC: 3.46 MIL/uL — ABNORMAL LOW (ref 3.87–5.11)
WBC: 3.2 10*3/uL — ABNORMAL LOW (ref 4.0–10.5)

## 2011-10-02 LAB — COMPLETE METABOLIC PANEL WITH GFR
ALT: 69 U/L — ABNORMAL HIGH (ref 0–35)
AST: 167 U/L — ABNORMAL HIGH (ref 0–37)
Alkaline Phosphatase: 107 U/L (ref 39–117)
BUN: 9 mg/dL (ref 6–23)
Chloride: 105 mEq/L (ref 96–112)
Creat: 0.77 mg/dL (ref 0.50–1.10)
Potassium: 4 mEq/L (ref 3.5–5.3)

## 2011-10-04 LAB — HIV-1 RNA QUANT-NO REFLEX-BLD
HIV 1 RNA Quant: 123 copies/mL — ABNORMAL HIGH (ref <20)
HIV-1 RNA Quant, Log: 2.09 {Log} — ABNORMAL HIGH (ref <1.30)

## 2011-10-11 ENCOUNTER — Ambulatory Visit (INDEPENDENT_AMBULATORY_CARE_PROVIDER_SITE_OTHER): Payer: Self-pay | Admitting: *Deleted

## 2011-10-11 ENCOUNTER — Telehealth: Payer: Self-pay | Admitting: *Deleted

## 2011-10-11 DIAGNOSIS — Z124 Encounter for screening for malignant neoplasm of cervix: Secondary | ICD-10-CM

## 2011-10-11 NOTE — Patient Instructions (Signed)
  Your results will be ready in about a week.  I will mail them to you.  Thank you for coming to the Center for your care.  Jashan Cotten 

## 2011-10-11 NOTE — Telephone Encounter (Signed)
[facility] spoke with [facility] staff person.  Although the pt stated that [facility] she has not had one in several years.  She will need to complete a Mammogram Scholarship Application at her next MD visit and an order for a screening mammogram placed in EPIC.  RN will notify Dr. Daiva Eves.

## 2011-10-11 NOTE — Progress Notes (Signed)
  Subjective:     Barbara Henry is a 51 y.o. woman who comes in today for a  pap smear only.  Pt believes that she had a mammogram last October, 2012.  Objective:    There were no vitals taken for this visit.  RN called [facility].  The pt has no record of having a mammogram anytime in the recent past.  Pelvic Exam:  Pap smear obtained.   Assessment:    Screening pap smear.   Plan:    Follow up in one year, or as indicated by Pap results. Pt given condoms. Pt given educational materials re: HIV and women, BSE, nutrition, diet, exercise, PAP smears, living with HIV over 50 and self-esteem.  Pt needs to complete a Mammogram Scholarship Application at her next MD visit and a screening mammogram ordered for the pt at [facility].  Will notify Dr. Daiva Eves.

## 2011-10-14 NOTE — Telephone Encounter (Signed)
Thanks Angelique Blonder, can we make sure we fill this out when she comes for her followup visit?

## 2011-10-16 ENCOUNTER — Ambulatory Visit: Payer: Self-pay | Admitting: Infectious Disease

## 2011-10-17 ENCOUNTER — Encounter: Payer: Self-pay | Admitting: *Deleted

## 2011-10-17 NOTE — Telephone Encounter (Addendum)
Last mammogram 11/03/2008 per EPIC.  She missed several f/u appts for Mammogram diagnostic and U/Ss.  I'll share one of the Scholarship forms with [place] so that she will have it in hand.  Next appt w/ you is 10/22/2011.

## 2011-10-18 NOTE — Telephone Encounter (Signed)
Thanks Denise! 

## 2011-10-22 ENCOUNTER — Ambulatory Visit (INDEPENDENT_AMBULATORY_CARE_PROVIDER_SITE_OTHER): Payer: Self-pay | Admitting: Infectious Disease

## 2011-10-22 ENCOUNTER — Encounter: Payer: Self-pay | Admitting: Infectious Disease

## 2011-10-22 VITALS — BP 108/73 | HR 88 | Temp 97.8°F | Ht 67.0 in | Wt 141.1 lb

## 2011-10-22 DIAGNOSIS — B20 Human immunodeficiency virus [HIV] disease: Secondary | ICD-10-CM

## 2011-10-22 DIAGNOSIS — B1710 Acute hepatitis C without hepatic coma: Secondary | ICD-10-CM

## 2011-10-22 NOTE — Progress Notes (Signed)
  Subjective:    Patient ID: Barbara Henry, female    DOB: Sep 30, 1960, 51 y.o.   MRN: [id number]  HPI  51 year old African American female previously perfect was suppressed on her antiretroviral regimen of Atripla. Her viral load came up to 9000 copies. I changed her to prezista norvir and truvada and her VL is now down to 123 copies of virus, cd4 is above 300. She had some initial nausea with regimen but that has subsided. She is not sexually active. Her pap smear was normal.   Review of Systems  Constitutional: Negative for fever, chills, diaphoresis, activity change, appetite change, fatigue and unexpected weight change.  HENT: Negative for congestion, sore throat, rhinorrhea, sneezing, trouble swallowing and sinus pressure.   Eyes: Negative for photophobia and visual disturbance.  Respiratory: Negative for cough, chest tightness, shortness of breath, wheezing and stridor.   Cardiovascular: Negative for chest pain, palpitations and leg swelling.  Gastrointestinal: Negative for nausea, vomiting, abdominal pain, diarrhea, constipation, blood in stool, abdominal distention and anal bleeding.  Genitourinary: Negative for dysuria, hematuria, flank pain and difficulty urinating.  Musculoskeletal: Negative for myalgias, back pain, joint swelling, arthralgias and gait problem.  Skin: Negative for color change, pallor, rash and wound.  Neurological: Negative for dizziness, tremors, weakness and light-headedness.  Hematological: Negative for adenopathy. Does not bruise/bleed easily.  Psychiatric/Behavioral: Negative for behavioral problems, confusion, sleep disturbance, dysphoric mood, decreased concentration and agitation.       Objective:   Physical Exam  Constitutional: She is oriented to person, place, and time. She appears well-developed and well-nourished. No distress.  HENT:  Head: Normocephalic and atraumatic.  Mouth/Throat: Oropharynx is clear and moist. No oropharyngeal exudate.    Eyes: Conjunctivae and EOM are normal. Pupils are equal, round, and reactive to light. No scleral icterus.  Neck: Normal range of motion. Neck supple. No JVD present.  Cardiovascular: Normal rate, regular rhythm and normal heart sounds.  Exam reveals no gallop and no friction rub.   No murmur heard. Pulmonary/Chest: Effort normal and breath sounds normal. No respiratory distress. She has no wheezes. She has no rales. She exhibits no tenderness.  Abdominal: She exhibits no distension and no mass. There is no tenderness. There is no rebound and no guarding.  Musculoskeletal: She exhibits no edema and no tenderness.  Lymphadenopathy:    She has no cervical adenopathy.  Neurological: She is alert and oriented to person, place, and time. She has normal reflexes. She exhibits normal muscle tone. Coordination normal.  Skin: Skin is warm and dry. She is not diaphoretic. No erythema. No pallor.  Psychiatric: She has a normal mood and affect. Her behavior is normal. Judgment and thought content normal.          Assessment & Plan:  HIV DISEASE Excellent control, recheck labs in 2 months  HEPATITIS C Check genotype and viral load and consider optoins. She would prefer no IFN contaiining regimen

## 2011-10-22 NOTE — Assessment & Plan Note (Signed)
Check genotype and viral load and consider optoins. She would prefer no IFN contaiining regimen

## 2011-10-22 NOTE — Assessment & Plan Note (Signed)
Excellent control, recheck labs in 2 months

## 2011-11-18 ENCOUNTER — Other Ambulatory Visit: Payer: Self-pay | Admitting: *Deleted

## 2011-11-18 DIAGNOSIS — B20 Human immunodeficiency virus [HIV] disease: Secondary | ICD-10-CM

## 2011-11-18 DIAGNOSIS — B1710 Acute hepatitis C without hepatic coma: Secondary | ICD-10-CM

## 2011-11-18 MED ORDER — EMTRICITABINE-TENOFOVIR DF 200-300 MG PO TABS: 1.0000 | ORAL_TABLET | Freq: Every day | ORAL | Status: DC

## 2011-11-18 MED ORDER — DARUNAVIR ETHANOLATE 800 MG PO TABS: 800.0000 mg | ORAL_TABLET | Freq: Every day | ORAL | Status: DC

## 2011-11-18 MED ORDER — RITONAVIR 100 MG PO TABS: 100.0000 mg | ORAL_TABLET | Freq: Every day | ORAL | Status: DC

## 2012-01-08 ENCOUNTER — Other Ambulatory Visit: Payer: Self-pay

## 2012-01-08 ENCOUNTER — Ambulatory Visit: Payer: Self-pay

## 2012-01-13 ENCOUNTER — Other Ambulatory Visit (INDEPENDENT_AMBULATORY_CARE_PROVIDER_SITE_OTHER): Payer: Self-pay

## 2012-01-13 DIAGNOSIS — B20 Human immunodeficiency virus [HIV] disease: Secondary | ICD-10-CM

## 2012-01-13 LAB — CBC WITH DIFFERENTIAL/PLATELET
Basophils Relative: 0 % (ref 0–1)
HCT: 32.3 % — ABNORMAL LOW (ref 36.0–46.0)
Hemoglobin: 10.9 g/dL — ABNORMAL LOW (ref 12.0–15.0)
Lymphocytes Relative: 27 % (ref 12–46)
Lymphs Abs: 1.2 10*3/uL (ref 0.7–4.0)
MCHC: 33.7 g/dL (ref 30.0–36.0)
Monocytes Relative: 14 % — ABNORMAL HIGH (ref 3–12)
Neutro Abs: 2.5 10*3/uL (ref 1.7–7.7)
Neutrophils Relative %: 58 % (ref 43–77)
RBC: 3.33 MIL/uL — ABNORMAL LOW (ref 3.87–5.11)
WBC: 4.3 10*3/uL (ref 4.0–10.5)

## 2012-01-13 LAB — COMPLETE METABOLIC PANEL WITH GFR
Albumin: 4.3 g/dL (ref 3.5–5.2)
Alkaline Phosphatase: 88 U/L (ref 39–117)
Calcium: 9.6 mg/dL (ref 8.4–10.5)
Chloride: 101 mEq/L (ref 96–112)
GFR, Est Non African American: 82 mL/min
Glucose, Bld: 81 mg/dL (ref 70–99)
Potassium: 3.8 mEq/L (ref 3.5–5.3)
Sodium: 135 mEq/L (ref 135–145)
Total Protein: 8.1 g/dL (ref 6.0–8.3)

## 2012-01-14 LAB — T-HELPER CELL (CD4) - (RCID CLINIC ONLY)
CD4 % Helper T Cell: 31 % — ABNORMAL LOW (ref 33–55)
CD4 T Cell Abs: 460 uL (ref 400–2700)

## 2012-01-14 LAB — HIV-1 RNA QUANT-NO REFLEX-BLD
HIV 1 RNA Quant: <20 copies/mL (ref <20)
HIV-1 RNA Quant, Log: <1.3 {Log} (ref <1.30)

## 2012-01-22 ENCOUNTER — Ambulatory Visit (INDEPENDENT_AMBULATORY_CARE_PROVIDER_SITE_OTHER): Payer: Self-pay | Admitting: Infectious Disease

## 2012-01-22 ENCOUNTER — Encounter: Payer: Self-pay | Admitting: Infectious Disease

## 2012-01-22 ENCOUNTER — Ambulatory Visit: Payer: Self-pay

## 2012-01-22 VITALS — BP 103/72 | HR 85 | Temp 97.5°F | Ht 67.0 in | Wt 138.0 lb

## 2012-01-22 DIAGNOSIS — B20 Human immunodeficiency virus [HIV] disease: Secondary | ICD-10-CM

## 2012-01-22 DIAGNOSIS — N898 Other specified noninflammatory disorders of vagina: Secondary | ICD-10-CM | POA: Insufficient documentation

## 2012-01-22 MED ORDER — FLUCONAZOLE 150 MG PO TABS: 150.0000 mg | ORAL_TABLET | Freq: Once | ORAL | Status: AC

## 2012-01-22 MED ORDER — METRONIDAZOLE 500 MG PO TABS: 500.0000 mg | ORAL_TABLET | Freq: Two times a day (BID) | ORAL | Status: AC

## 2012-01-22 NOTE — Addendum Note (Signed)
Addended by: Wendall Mola A on: 01/22/2012 03:22 PM   Modules accepted: Orders

## 2012-01-22 NOTE — Progress Notes (Signed)
Subjective:    Patient ID: Barbara Henry, female    DOB: 16-Feb-1961, 51 y.o.   MRN: [id number]  HPI  Barbara Henry is a 51 y.o. female who is doing superbly well on her  antiviral regimen, prezista, norvir and truvada with undetectable viral load and health cd4 count.  She has noticed a greenish discharge from her vagina that disturbs her. No sexual activity past year.  I performed pelvic exam and sent for gc an chlamydia, wet prep. I spent greater than 45 minutes with the patient including greater than 50% of time in face to face counsel of the patient and in coordination of their care.   Review of Systems  Constitutional: Negative for fever, chills, diaphoresis, activity change, appetite change, fatigue and unexpected weight change.  HENT: Negative for congestion, sore throat, rhinorrhea, sneezing, trouble swallowing and sinus pressure.   Eyes: Negative for photophobia and visual disturbance.  Respiratory: Negative for cough, chest tightness, shortness of breath, wheezing and stridor.   Cardiovascular: Negative for chest pain, palpitations and leg swelling.  Gastrointestinal: Negative for nausea, vomiting, abdominal pain, diarrhea, constipation, blood in stool, abdominal distention and anal bleeding.  Genitourinary: Positive for vaginal discharge. Negative for dysuria, hematuria, flank pain and difficulty urinating.  Musculoskeletal: Negative for myalgias, back pain, joint swelling, arthralgias and gait problem.  Skin: Negative for color change, pallor, rash and wound.  Neurological: Negative for dizziness, tremors, weakness and light-headedness.  Hematological: Negative for adenopathy. Does not bruise/bleed easily.  Psychiatric/Behavioral: Negative for behavioral problems, confusion, disturbed wake/sleep cycle, dysphoric mood, decreased concentration and agitation.       Objective:   Physical Exam  Constitutional: She is oriented to person, place, and time. She appears  well-developed and well-nourished. No distress.  HENT:  Head: Normocephalic and atraumatic.  Mouth/Throat: Oropharynx is clear and moist. No oropharyngeal exudate.  Eyes: Conjunctivae and EOM are normal. Pupils are equal, round, and reactive to light. No scleral icterus.  Neck: Normal range of motion. Neck supple. No JVD present.  Cardiovascular: Normal rate, regular rhythm and normal heart sounds.  Exam reveals no gallop and no friction rub.   No murmur heard. Pulmonary/Chest: Effort normal and breath sounds normal. No respiratory distress. She has no wheezes. She has no rales. She exhibits no tenderness.  Abdominal: She exhibits no distension and no mass. There is no tenderness. There is no rebound and no guarding.  Genitourinary: There is no rash, tenderness, lesion or injury on the right labia. There is no rash, tenderness, lesion or injury on the left labia. There is erythema and bleeding around the vagina. No tenderness around the vagina. No foreign body around the vagina. No signs of injury around the vagina. Vaginal discharge found.       mikly discharge observed malodorous  Musculoskeletal: She exhibits no edema and no tenderness.  Lymphadenopathy:    She has no cervical adenopathy.  Neurological: She is alert and oriented to person, place, and time. She has normal reflexes. She exhibits normal muscle tone. Coordination normal.  Skin: Skin is warm and dry. She is not diaphoretic. No erythema. No pallor.  Psychiatric: She has a normal mood and affect. Her behavior is normal. Judgment and thought content normal.          Assessment & Plan:  HIV DISEASE Excellent control. Rtc in 4 months  Vaginal Discharge Wet prep, gc and chlamydia sent. Will rx empirically for BV and also rx for fluconazole if this does not resolve it

## 2012-01-22 NOTE — Patient Instructions (Addendum)
I have sent rx for metronidazole to be taken for 7 days twice a day  If this does NOT improve symptoms take the fluconazole 150mg  tablet

## 2012-01-22 NOTE — Assessment & Plan Note (Signed)
Wet prep, gc and chlamydia sent. Will rx empirically for BV and also rx for fluconazole if this does not resolve it

## 2012-01-22 NOTE — Assessment & Plan Note (Signed)
Excellent control. Rtc in 4 months

## 2012-01-23 ENCOUNTER — Telehealth: Payer: Self-pay | Admitting: *Deleted

## 2012-01-23 NOTE — Telephone Encounter (Signed)
Call from [facility] lab, the sample they received for patient they are unable to check for gonorrhea, but they can check for chlamydia. Lab notified to order the correct swabs. Wendall Mola CMA

## 2012-01-24 NOTE — Telephone Encounter (Signed)
i thought I also ordered a urine gc on her?

## 2012-01-27 ENCOUNTER — Other Ambulatory Visit (INDEPENDENT_AMBULATORY_CARE_PROVIDER_SITE_OTHER): Payer: Self-pay | Admitting: Infectious Disease

## 2012-01-27 ENCOUNTER — Other Ambulatory Visit: Payer: Self-pay

## 2012-01-27 ENCOUNTER — Other Ambulatory Visit: Payer: Self-pay | Admitting: *Deleted

## 2012-01-27 ENCOUNTER — Other Ambulatory Visit: Payer: Self-pay | Admitting: Infectious Disease

## 2012-01-27 DIAGNOSIS — Z113 Encounter for screening for infections with a predominantly sexual mode of transmission: Secondary | ICD-10-CM

## 2012-01-27 DIAGNOSIS — B20 Human immunodeficiency virus [HIV] disease: Secondary | ICD-10-CM

## 2012-01-27 NOTE — Telephone Encounter (Signed)
Spoke with patient she has not received the Flagyl yet, it is coming by mail order, she will come in for a lab appt today to give a urine sample. Wendall Mola CMA

## 2012-01-27 NOTE — Telephone Encounter (Signed)
As we discussed based on her having Trichomonas she should take 2 grams of oral metronidazole x 1

## 2012-01-27 NOTE — Telephone Encounter (Signed)
Can we find out how she is doing after her therapy with flagyl? If she still has dc she should come back for GC and chlamydia urine test

## 2012-01-27 NOTE — Telephone Encounter (Signed)
Patient did not give a urine sample. Wendall Mola CMA

## 2012-01-28 LAB — URINALYSIS
Glucose, UA: NEGATIVE mg/dL
Hgb urine dipstick: NEGATIVE
Ketones, ur: NEGATIVE mg/dL
Protein, ur: NEGATIVE mg/dL
pH: 6 (ref 5.0–8.0)

## 2012-05-21 ENCOUNTER — Other Ambulatory Visit (INDEPENDENT_AMBULATORY_CARE_PROVIDER_SITE_OTHER): Payer: Self-pay

## 2012-05-21 DIAGNOSIS — B1710 Acute hepatitis C without hepatic coma: Secondary | ICD-10-CM

## 2012-05-21 DIAGNOSIS — B20 Human immunodeficiency virus [HIV] disease: Secondary | ICD-10-CM

## 2012-05-21 LAB — COMPLETE METABOLIC PANEL WITH GFR
Albumin: 4.3 g/dL (ref 3.5–5.2)
Alkaline Phosphatase: 93 U/L (ref 39–117)
BUN: 8 mg/dL (ref 6–23)
CO2: 23 mEq/L (ref 19–32)
Calcium: 9.1 mg/dL (ref 8.4–10.5)
Chloride: 103 mEq/L (ref 96–112)
GFR, Est African American: >89 mL/min
GFR, Est Non African American: >89 mL/min
Glucose, Bld: 51 mg/dL — ABNORMAL LOW (ref 70–99)
Potassium: 3.9 mEq/L (ref 3.5–5.3)
Sodium: 139 mEq/L (ref 135–145)
Total Protein: 7.3 g/dL (ref 6.0–8.3)

## 2012-05-21 LAB — CBC WITH DIFFERENTIAL/PLATELET
Basophils Absolute: 0 10*3/uL (ref 0.0–0.1)
Basophils Relative: 0 % (ref 0–1)
HCT: 32.6 % — ABNORMAL LOW (ref 36.0–46.0)
Lymphocytes Relative: 35 % (ref 12–46)
MCHC: 34 g/dL (ref 30.0–36.0)
Monocytes Absolute: 0.5 10*3/uL (ref 0.1–1.0)
Neutro Abs: 1 10*3/uL — ABNORMAL LOW (ref 1.7–7.7)
Platelets: 140 10*3/uL — ABNORMAL LOW (ref 150–400)
RDW: 15.7 % — ABNORMAL HIGH (ref 11.5–15.5)
WBC: 2.3 10*3/uL — ABNORMAL LOW (ref 4.0–10.5)

## 2012-05-22 LAB — T-HELPER CELL (CD4) - (RCID CLINIC ONLY): CD4 % Helper T Cell: 32 % — ABNORMAL LOW (ref 33–55)

## 2012-06-04 ENCOUNTER — Encounter: Payer: Self-pay | Admitting: Infectious Disease

## 2012-06-04 ENCOUNTER — Ambulatory Visit (INDEPENDENT_AMBULATORY_CARE_PROVIDER_SITE_OTHER): Payer: Self-pay | Admitting: Infectious Disease

## 2012-06-04 ENCOUNTER — Ambulatory Visit: Payer: Self-pay

## 2012-06-04 VITALS — BP 124/90 | HR 66 | Temp 98.2°F | Ht 67.0 in | Wt 137.5 lb

## 2012-06-04 DIAGNOSIS — B20 Human immunodeficiency virus [HIV] disease: Secondary | ICD-10-CM

## 2012-06-04 DIAGNOSIS — B1710 Acute hepatitis C without hepatic coma: Secondary | ICD-10-CM

## 2012-06-04 DIAGNOSIS — Z23 Encounter for immunization: Secondary | ICD-10-CM

## 2012-06-04 MED ORDER — DARUNAVIR ETHANOLATE 800 MG PO TABS: 800.0000 mg | ORAL_TABLET | Freq: Every day | ORAL | Status: DC

## 2012-06-04 MED ORDER — EMTRICITABINE-TENOFOVIR DF 200-300 MG PO TABS: 1.0000 | ORAL_TABLET | Freq: Every day | ORAL | Status: DC

## 2012-06-04 MED ORDER — RITONAVIR 100 MG PO TABS: 100.0000 mg | ORAL_TABLET | Freq: Every day | ORAL | Status: DC

## 2012-06-04 NOTE — Progress Notes (Signed)
  Subjective:    Patient ID: Barbara Henry, female    DOB: August 03, 1960, 52 y.o.   MRN: [id number]  HPI   52 y.o. female who is doing superbly well on her antiviral regimen, prezista, norvir and truvada with undetectable viral load and health cd4 count. 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 She had vaginal dc at last visit but this has resolved. She has had URI symptoms but x 3 weeks and cough slow to resolve but no fevers, or productive cough Otherwise doing very well.    Review of Systems  Constitutional: Negative for fever, chills, diaphoresis, activity change, appetite change, fatigue and unexpected weight change.  HENT: Negative for congestion, sore throat, rhinorrhea, sneezing, trouble swallowing and sinus pressure.   Eyes: Negative for photophobia and visual disturbance.  Respiratory: Negative for cough, chest tightness, shortness of breath, wheezing and stridor.   Cardiovascular: Negative for chest pain, palpitations and leg swelling.  Gastrointestinal: Negative for nausea, vomiting, abdominal pain, diarrhea, constipation, blood in stool, abdominal distention and anal bleeding.  Genitourinary: Negative for dysuria, hematuria, flank pain and difficulty urinating.  Musculoskeletal: Negative for myalgias, back pain, joint swelling, arthralgias and gait problem.  Skin: Negative for color change, pallor, rash and wound.  Neurological: Negative for dizziness, tremors, weakness and light-headedness.  Hematological: Negative for adenopathy. Does not bruise/bleed easily.  Psychiatric/Behavioral: Negative for behavioral problems, confusion, sleep disturbance, dysphoric mood, decreased concentration and agitation.       Objective:   Physical Exam  Constitutional: She is oriented to person, place, and time. She appears well-developed and well-nourished. No distress.  HENT:  Head: Normocephalic and atraumatic.  Mouth/Throat: Oropharynx is clear and moist. No  oropharyngeal exudate.  Eyes: Conjunctivae normal and EOM are normal. Pupils are equal, round, and reactive to light. No scleral icterus.  Neck: Normal range of motion. Neck supple. No JVD present.  Cardiovascular: Normal rate, regular rhythm and normal heart sounds.  Exam reveals no gallop and no friction rub.   No murmur heard. Pulmonary/Chest: Effort normal and breath sounds normal. No respiratory distress. She has no wheezes. She has no rales. She exhibits no tenderness.  Abdominal: She exhibits no distension and no mass. There is no tenderness. There is no rebound and no guarding.  Musculoskeletal: She exhibits no edema and no tenderness.  Lymphadenopathy:    She has no cervical adenopathy.  Neurological: She is alert and oriented to person, place, and time. She has normal reflexes. She exhibits normal muscle tone. Coordination normal.  Skin: Skin is warm and dry. She is not diaphoretic. No erythema. No pallor.  Psychiatric: She has a normal mood and affect. Her behavior is normal. Judgment and thought content normal.          Assessment & Plan:  HIV: continue prezista,norvir truvada  Vaginal dc: resolved  URI: continue symptomatic rx, she is to call if this worsens

## 2012-08-10 ENCOUNTER — Other Ambulatory Visit: Payer: Self-pay | Admitting: *Deleted

## 2012-08-10 DIAGNOSIS — B20 Human immunodeficiency virus [HIV] disease: Secondary | ICD-10-CM

## 2012-08-12 ENCOUNTER — Telehealth: Payer: Self-pay | Admitting: *Deleted

## 2012-08-12 NOTE — Telephone Encounter (Signed)
Unable to talk with pt to determine need for medication at this time.  Phone numbers invalid.  Notified [facility] that office was unable to contact the pt.

## 2012-10-09 ENCOUNTER — Other Ambulatory Visit: Payer: Self-pay | Admitting: Licensed Clinical Social Worker

## 2012-11-19 ENCOUNTER — Other Ambulatory Visit (INDEPENDENT_AMBULATORY_CARE_PROVIDER_SITE_OTHER): Payer: Self-pay

## 2012-11-19 DIAGNOSIS — Z113 Encounter for screening for infections with a predominantly sexual mode of transmission: Secondary | ICD-10-CM

## 2012-11-19 DIAGNOSIS — B20 Human immunodeficiency virus [HIV] disease: Secondary | ICD-10-CM

## 2012-11-19 DIAGNOSIS — Z79899 Other long term (current) drug therapy: Secondary | ICD-10-CM

## 2012-11-19 LAB — CBC WITH DIFFERENTIAL/PLATELET
HCT: 32.3 % — ABNORMAL LOW (ref 36.0–46.0)
Hemoglobin: 11.6 g/dL — ABNORMAL LOW (ref 12.0–15.0)
Lymphs Abs: 1.3 10*3/uL (ref 0.7–4.0)
Monocytes Absolute: 0.7 10*3/uL (ref 0.1–1.0)
Monocytes Relative: 17 % — ABNORMAL HIGH (ref 3–12)
Neutro Abs: 1.9 10*3/uL (ref 1.7–7.7)
Neutrophils Relative %: 47 % (ref 43–77)
RBC: 3.4 MIL/uL — ABNORMAL LOW (ref 3.87–5.11)

## 2012-11-20 LAB — T-HELPER CELL (CD4) - (RCID CLINIC ONLY)
CD4 % Helper T Cell: 34 % (ref 33–55)
CD4 T Cell Abs: 460 uL (ref 400–2700)

## 2012-11-20 LAB — LIPID PANEL
Cholesterol: 228 mg/dL — ABNORMAL HIGH (ref 0–200)
LDL Cholesterol: 103 mg/dL — ABNORMAL HIGH (ref 0–99)
VLDL: 20 mg/dL (ref 0–40)

## 2012-11-20 LAB — COMPLETE METABOLIC PANEL WITH GFR
Albumin: 4.6 g/dL (ref 3.5–5.2)
Alkaline Phosphatase: 102 U/L (ref 39–117)
Calcium: 10.2 mg/dL (ref 8.4–10.5)
Creat: 0.91 mg/dL (ref 0.50–1.10)
GFR, Est African American: 85 mL/min
Glucose, Bld: 90 mg/dL (ref 70–99)
Sodium: 137 mEq/L (ref 135–145)
Total Bilirubin: 0.8 mg/dL (ref 0.3–1.2)

## 2012-11-20 LAB — HIV-1 RNA QUANT-NO REFLEX-BLD: HIV-1 RNA Quant, Log: <1.3 {Log} (ref <1.30)

## 2012-11-20 LAB — RPR

## 2012-12-03 ENCOUNTER — Ambulatory Visit (INDEPENDENT_AMBULATORY_CARE_PROVIDER_SITE_OTHER): Payer: Self-pay | Admitting: Infectious Disease

## 2012-12-03 VITALS — BP 111/76 | HR 83 | Temp 98.1°F | Ht 67.0 in | Wt 136.5 lb

## 2012-12-03 DIAGNOSIS — B20 Human immunodeficiency virus [HIV] disease: Secondary | ICD-10-CM

## 2012-12-03 DIAGNOSIS — B1920 Unspecified viral hepatitis C without hepatic coma: Secondary | ICD-10-CM

## 2012-12-03 DIAGNOSIS — R21 Rash and other nonspecific skin eruption: Secondary | ICD-10-CM

## 2012-12-03 MED ORDER — TRIAMCINOLONE ACETONIDE 0.5 % EX OINT: TOPICAL_OINTMENT | Freq: Two times a day (BID) | CUTANEOUS | Status: DC

## 2012-12-03 NOTE — Progress Notes (Signed)
  Subjective:    Patient ID: Barbara Henry, female    DOB: 06/10/1960, 52 y.o.   MRN: [id number]  HPI   52y.o. female who is doing superbly well on her antiviral regimen, prezista, norvir and truvada with undetectable viral load and health cd4 count. 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   Today she had complaint of rash around her neck that is intensely pruritic and which she claims she develops every year around this time of year.    Review of Systems  Constitutional: Negative for fever, chills, diaphoresis, activity change, appetite change, fatigue and unexpected weight change.  HENT: Negative for congestion, sore throat, rhinorrhea, sneezing, trouble swallowing and sinus pressure.   Eyes: Negative for photophobia and visual disturbance.  Respiratory: Negative for cough, chest tightness, shortness of breath, wheezing and stridor.   Cardiovascular: Negative for chest pain, palpitations and leg swelling.  Gastrointestinal: Negative for nausea, vomiting, abdominal pain, diarrhea, constipation, blood in stool, abdominal distention and anal bleeding.  Genitourinary: Negative for dysuria, hematuria, flank pain and difficulty urinating.  Musculoskeletal: Negative for myalgias, back pain, joint swelling, arthralgias and gait problem.  Skin: Negative for color change, pallor, rash and wound.  Neurological: Negative for dizziness, tremors, weakness and light-headedness.  Hematological: Negative for adenopathy. Does not bruise/bleed easily.  Psychiatric/Behavioral: Negative for behavioral problems, confusion, sleep disturbance, dysphoric mood, decreased concentration and agitation.       Objective:   Physical Exam  Constitutional: She is oriented to person, place, and time. She appears well-developed and well-nourished. No distress.  HENT:  Head: Normocephalic and atraumatic.  Mouth/Throat: Oropharynx is clear and moist. No oropharyngeal exudate.  Eyes:  Conjunctivae and EOM are normal. Pupils are equal, round, and reactive to light. No scleral icterus.  Neck: Normal range of motion. Neck supple. No JVD present.  Cardiovascular: Normal rate, regular rhythm and normal heart sounds.  Exam reveals no gallop and no friction rub.   No murmur heard. Pulmonary/Chest: Effort normal and breath sounds normal. No respiratory distress. She has no wheezes. She has no rales. She exhibits no tenderness.  Abdominal: She exhibits no distension and no mass. There is no tenderness. There is no rebound and no guarding.  Musculoskeletal: She exhibits no edema and no tenderness.  Lymphadenopathy:    She has no cervical adenopathy.  Neurological: She is alert and oriented to person, place, and time. She has normal reflexes. She exhibits normal muscle tone. Coordination normal.  Skin: Skin is warm and dry. She is not diaphoretic. No erythema. No pallor.  Psychiatric: She has a normal mood and affect. Her behavior is normal. Judgment and thought content normal.          Assessment & Plan:  HIV: continue prezista,norvir truvada  Rash: will rx topical triamcinolone  Hep C: refer for treatment

## 2012-12-18 ENCOUNTER — Other Ambulatory Visit: Payer: Self-pay | Admitting: Licensed Clinical Social Worker

## 2012-12-18 DIAGNOSIS — B1710 Acute hepatitis C without hepatic coma: Secondary | ICD-10-CM

## 2012-12-18 DIAGNOSIS — B20 Human immunodeficiency virus [HIV] disease: Secondary | ICD-10-CM

## 2012-12-18 MED ORDER — RITONAVIR 100 MG PO TABS: 100.0000 mg | ORAL_TABLET | Freq: Every day | ORAL | Status: DC

## 2012-12-21 ENCOUNTER — Other Ambulatory Visit: Payer: Self-pay | Admitting: *Deleted

## 2012-12-21 DIAGNOSIS — B20 Human immunodeficiency virus [HIV] disease: Secondary | ICD-10-CM

## 2012-12-21 DIAGNOSIS — B1710 Acute hepatitis C without hepatic coma: Secondary | ICD-10-CM

## 2012-12-21 MED ORDER — EMTRICITABINE-TENOFOVIR DF 200-300 MG PO TABS: 1.0000 | ORAL_TABLET | Freq: Every day | ORAL | Status: DC

## 2012-12-21 MED ORDER — DARUNAVIR ETHANOLATE 800 MG PO TABS: 800.0000 mg | ORAL_TABLET | Freq: Every day | ORAL | Status: DC

## 2012-12-22 ENCOUNTER — Other Ambulatory Visit: Payer: Self-pay | Admitting: *Deleted

## 2012-12-22 DIAGNOSIS — B20 Human immunodeficiency virus [HIV] disease: Secondary | ICD-10-CM

## 2012-12-22 DIAGNOSIS — B1710 Acute hepatitis C without hepatic coma: Secondary | ICD-10-CM

## 2012-12-22 MED ORDER — DARUNAVIR ETHANOLATE 800 MG PO TABS: 800.0000 mg | ORAL_TABLET | Freq: Every day | ORAL | Status: DC

## 2012-12-22 MED ORDER — RITONAVIR 100 MG PO TABS: 100.0000 mg | ORAL_TABLET | Freq: Every day | ORAL | Status: DC

## 2012-12-22 MED ORDER — EMTRICITABINE-TENOFOVIR DF 200-300 MG PO TABS: 1.0000 | ORAL_TABLET | Freq: Every day | ORAL | Status: DC

## 2013-01-01 ENCOUNTER — Encounter (HOSPITAL_BASED_OUTPATIENT_CLINIC_OR_DEPARTMENT_OTHER): Payer: Self-pay | Admitting: *Deleted

## 2013-01-01 ENCOUNTER — Emergency Department (HOSPITAL_BASED_OUTPATIENT_CLINIC_OR_DEPARTMENT_OTHER)
Admission: EM | Admit: 2013-01-01 | Discharge: 2013-01-01 | Disposition: A | Discharge status: Home / Self Care | Payer: Self-pay | Attending: Emergency Medicine | Admitting: Emergency Medicine

## 2013-01-01 DIAGNOSIS — Z8619 Personal history of other infectious and parasitic diseases: Secondary | ICD-10-CM | POA: Insufficient documentation

## 2013-01-01 DIAGNOSIS — Z862 Personal history of diseases of the blood and blood-forming organs and certain disorders involving the immune mechanism: Secondary | ICD-10-CM | POA: Insufficient documentation

## 2013-01-01 DIAGNOSIS — Z79899 Other long term (current) drug therapy: Secondary | ICD-10-CM | POA: Insufficient documentation

## 2013-01-01 DIAGNOSIS — Z7982 Long term (current) use of aspirin: Secondary | ICD-10-CM | POA: Insufficient documentation

## 2013-01-01 DIAGNOSIS — Z21 Asymptomatic human immunodeficiency virus [HIV] infection status: Secondary | ICD-10-CM | POA: Insufficient documentation

## 2013-01-01 DIAGNOSIS — S01511A Laceration without foreign body of lip, initial encounter: Secondary | ICD-10-CM

## 2013-01-01 DIAGNOSIS — S01501A Unspecified open wound of lip, initial encounter: Secondary | ICD-10-CM | POA: Insufficient documentation

## 2013-01-01 HISTORY — DX: Anemia, unspecified: D64.9

## 2013-01-01 HISTORY — DX: Asymptomatic human immunodeficiency virus (hiv) infection status: Z21

## 2013-01-01 HISTORY — DX: Zoster without complications: B02.9

## 2013-01-01 HISTORY — DX: Human immunodeficiency virus (HIV) disease: B20

## 2013-01-01 MED ORDER — PENICILLIN V POTASSIUM 500 MG PO TABS: 500.0000 mg | ORAL_TABLET | Freq: Four times a day (QID) | ORAL | Status: AC
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
## 2013-01-01 NOTE — ED Provider Notes (Signed)
Medical screening examination/treatment/procedure(s) were performed by non-physician practitioner and as supervising physician I was immediately available for consultation/collaboration.   Rolan Bucco, MD 01/01/13 [phone]

## 2013-01-20 ENCOUNTER — Ambulatory Visit: Payer: Self-pay

## 2013-03-09 ENCOUNTER — Telehealth: Payer: Self-pay | Admitting: *Deleted

## 2013-03-09 ENCOUNTER — Ambulatory Visit (INDEPENDENT_AMBULATORY_CARE_PROVIDER_SITE_OTHER): Payer: Self-pay | Admitting: *Deleted

## 2013-03-09 DIAGNOSIS — Z23 Encounter for immunization: Secondary | ICD-10-CM

## 2013-03-09 NOTE — Telephone Encounter (Signed)
Pt arrived for flu shot (nurse visit), c/o sore throat x 3 days without fever, has some tenderness in her neck without swelling, some slight ear pain. Pt has had no sick contact, has missed no medications (was undetectable with CD4 in the 400s at last lab visit).  RN asked Dr. Drue Second for advice; pt to use therapeutic comfort measures for her sore throat, stay hydrated, get flu shot today, contact us if she develops a fever.  Per Dr. Drue Second, this is most likely viral and will resolve on its own.  Pt verbalized understanding, received her flu shot and a bus pass home.  Pt will contact us if her symptoms worsen.  Andree Coss, RN

## 2013-04-20 ENCOUNTER — Encounter: Payer: Self-pay | Admitting: *Deleted

## 2013-04-20 ENCOUNTER — Telehealth: Payer: Self-pay | Admitting: *Deleted

## 2013-04-20 NOTE — Telephone Encounter (Signed)
Pt needing PAP smear appt.  Unable to contact pt, phone not working.  Will send letter.

## 2013-05-31 ENCOUNTER — Telehealth: Payer: Self-pay | Admitting: *Deleted

## 2013-05-31 ENCOUNTER — Ambulatory Visit: Payer: Self-pay

## 2013-05-31 NOTE — Telephone Encounter (Signed)
Unable to reach pt by phone.  Still needs PAP smear.

## 2013-06-09 ENCOUNTER — Encounter: Payer: Self-pay | Admitting: Infectious Disease

## 2013-06-09 ENCOUNTER — Encounter: Payer: Self-pay | Admitting: *Deleted

## 2013-06-09 ENCOUNTER — Other Ambulatory Visit: Payer: Self-pay

## 2013-06-09 ENCOUNTER — Ambulatory Visit (INDEPENDENT_AMBULATORY_CARE_PROVIDER_SITE_OTHER): Payer: Self-pay | Admitting: Infectious Disease

## 2013-06-09 VITALS — BP 125/87 | HR 80 | Temp 97.8°F | Ht 67.0 in | Wt 147.5 lb

## 2013-06-09 DIAGNOSIS — Z79899 Other long term (current) drug therapy: Secondary | ICD-10-CM

## 2013-06-09 DIAGNOSIS — B20 Human immunodeficiency virus [HIV] disease: Secondary | ICD-10-CM

## 2013-06-09 DIAGNOSIS — R21 Rash and other nonspecific skin eruption: Secondary | ICD-10-CM | POA: Insufficient documentation

## 2013-06-09 DIAGNOSIS — B1920 Unspecified viral hepatitis C without hepatic coma: Secondary | ICD-10-CM

## 2013-06-09 DIAGNOSIS — B86 Scabies: Secondary | ICD-10-CM

## 2013-06-09 DIAGNOSIS — Z113 Encounter for screening for infections with a predominantly sexual mode of transmission: Secondary | ICD-10-CM

## 2013-06-09 LAB — CBC WITH DIFFERENTIAL/PLATELET
BASOS PCT: 1 % (ref 0–1)
Basophils Absolute: 0 10*3/uL (ref 0.0–0.1)
Eosinophils Absolute: 0.5 10*3/uL (ref 0.0–0.7)
Eosinophils Relative: 15 % — ABNORMAL HIGH (ref 0–5)
HCT: 31.7 % — ABNORMAL LOW (ref 36.0–46.0)
Hemoglobin: 10.7 g/dL — ABNORMAL LOW (ref 12.0–15.0)
LYMPHS ABS: 0.7 10*3/uL (ref 0.7–4.0)
Lymphocytes Relative: 23 % (ref 12–46)
MCH: 32.4 pg (ref 26.0–34.0)
MCHC: 33.8 g/dL (ref 30.0–36.0)
MCV: 96.1 fL (ref 78.0–100.0)
Monocytes Absolute: 0.5 10*3/uL (ref 0.1–1.0)
Monocytes Relative: 15 % — ABNORMAL HIGH (ref 3–12)
NEUTROS ABS: 1.5 10*3/uL — AB (ref 1.7–7.7)
NEUTROS PCT: 46 % (ref 43–77)
Platelets: 136 10*3/uL — ABNORMAL LOW (ref 150–400)
RBC: 3.3 MIL/uL — AB (ref 3.87–5.11)
RDW: 15.2 % (ref 11.5–15.5)
WBC: 3.2 10*3/uL — ABNORMAL LOW (ref 4.0–10.5)

## 2013-06-09 LAB — COMPLETE METABOLIC PANEL WITH GFR
ALK PHOS: 102 U/L (ref 39–117)
ALT: 38 U/L — ABNORMAL HIGH (ref 0–35)
AST: 55 U/L — ABNORMAL HIGH (ref 0–37)
Albumin: 4 g/dL (ref 3.5–5.2)
BILIRUBIN TOTAL: 0.3 mg/dL (ref 0.3–1.2)
BUN: 14 mg/dL (ref 6–23)
CO2: 25 meq/L (ref 19–32)
Calcium: 9.6 mg/dL (ref 8.4–10.5)
Chloride: 105 mEq/L (ref 96–112)
Creat: 1.21 mg/dL — ABNORMAL HIGH (ref 0.50–1.10)
GFR, Est African American: 60 mL/min
GFR, Est Non African American: 52 mL/min — ABNORMAL LOW
Glucose, Bld: 62 mg/dL — ABNORMAL LOW (ref 70–99)
Potassium: 3.7 mEq/L (ref 3.5–5.3)
SODIUM: 140 meq/L (ref 135–145)
TOTAL PROTEIN: 7.5 g/dL (ref 6.0–8.3)

## 2013-06-09 LAB — LIPID PANEL
Cholesterol: 169 mg/dL (ref 0–200)
HDL: 76 mg/dL (ref >39)
LDL CALC: 80 mg/dL (ref 0–99)
TRIGLYCERIDES: 64 mg/dL (ref <150)
Total CHOL/HDL Ratio: 2.2 Ratio
VLDL: 13 mg/dL (ref 0–40)

## 2013-06-09 LAB — RPR

## 2013-06-09 LAB — PHOSPHORUS: Phosphorus: 2.3 mg/dL (ref 2.3–4.6)

## 2013-06-09 MED ORDER — PERMETHRIN 5 % EX CREA: 1.0000 "application " | TOPICAL_CREAM | CUTANEOUS | Status: DC

## 2013-06-09 NOTE — Progress Notes (Signed)
Patient ID: Barbara RoachRobin S Henry, female   DOB: Jan 15, 1961, 53 y.o.   MRN: [id number] Walk-in to clinic following lab draw.  Pt c/o rash "all over."  Severe itching.  Requesting  Appt.  RN spoke with Dr. Daiva EvesVan Dam and he agreed to add the pt on to his schedule for this problem.  Pt added to schedule.

## 2013-06-09 NOTE — Progress Notes (Signed)
Subjective:    Patient ID: Barbara Henry, female    DOB: [birth year], 53 y.o.   MRN: [id number]  HPI   52y.o. female who is doing superbly well on her antiviral regimen, prezista, norvir and truvada with undetectable viral load and health cd4 count. 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   Today she returns to clinic for complaints of a rash. No when she was seen in clinic in the summer she also had a rash around her neck that was treated with triamcinolone presumably successfully.  Today she has had an intensely periodic scaly raised rash on her arms hands as well as her abdomen chest and legs. Her younger son is also succumbed to a similar rash. His been present for more than a month having first been noticed in late November.   I'm concerned that she is suffering frm scabies and the like her to be treated with topical permethrin.   Review of Systems  Constitutional: Negative for fever, chills, diaphoresis, activity change, appetite change, fatigue and unexpected weight change.  HENT: Negative for congestion, rhinorrhea, sinus pressure, sneezing, sore throat and trouble swallowing.   Eyes: Negative for photophobia and visual disturbance.  Respiratory: Negative for cough, chest tightness, shortness of breath, wheezing and stridor.   Cardiovascular: Negative for chest pain, palpitations and leg swelling.  Gastrointestinal: Negative for nausea, vomiting, abdominal pain, diarrhea, constipation, blood in stool, abdominal distention and anal bleeding.  Genitourinary: Negative for dysuria, hematuria, flank pain and difficulty urinating.  Musculoskeletal: Negative for arthralgias, back pain, gait problem, joint swelling and myalgias.  Skin: Positive for rash. Negative for color change, pallor and wound.  Neurological: Negative for dizziness, tremors, weakness and light-headedness.  Hematological: Negative for adenopathy. Does not bruise/bleed easily.    Psychiatric/Behavioral: Negative for behavioral problems, confusion, sleep disturbance, dysphoric mood, decreased concentration and agitation.       Objective:   Physical Exam  Constitutional: She is oriented to person, place, and time. She appears well-developed and well-nourished. No distress.  HENT:  Head: Normocephalic and atraumatic.  Mouth/Throat: Oropharynx is clear and moist.  Eyes: Conjunctivae and EOM are normal.  Neck: Normal range of motion. Neck supple.  Cardiovascular: Normal rate and regular rhythm.   Pulmonary/Chest: Effort normal and breath sounds normal. No respiratory distress. She has no wheezes.  Abdominal: She exhibits no distension. There is no tenderness.  Musculoskeletal: She exhibits no edema.  Neurological: She is alert and oriented to person, place, and time. She has normal reflexes. She exhibits normal muscle tone. Coordination normal.  Skin: Skin is warm and dry. Rash noted. She is not diaphoretic. There is erythema.  Psychiatric: She has a normal mood and affect. Her behavior is normal. Judgment and thought content normal.   See above description              Assessment & Plan:   Rash: I'm concerned this could be scabies like to give her permethrin. Apparently this and all other anti-scabies medications her not covered by the AIDS drug assistance program. Therefore were sent at the health Department aspirate be treated for scabies as well as her son. We'll see her back in a few weeks later have a second dose at in 2 weeks' time. If she fails to respond to scabies will consider empiric trial of topical steroids again and/or biopsy.  HIV: continue prezista,norvir truvada: She is getting repeat labs done today and she clearly needs to get reenrolled with the AIDS  drug assistance program.  Hep C: Would like to treat her here she will clearly need pharmaceutical assistance given her lack of healthcare coverage. Hopefully this can be relatively easy to  obtain for her.

## 2013-06-10 LAB — T-HELPER CELL (CD4) - (RCID CLINIC ONLY)
CD4 % Helper T Cell: 44 % (ref 33–55)
CD4 T Cell Abs: 380 /uL — ABNORMAL LOW (ref 400–2700)

## 2013-06-11 LAB — HIV-1 RNA QUANT-NO REFLEX-BLD: HIV 1 RNA Quant: <20 copies/mL (ref <20)

## 2013-06-18 ENCOUNTER — Ambulatory Visit: Payer: Self-pay

## 2013-06-18 ENCOUNTER — Ambulatory Visit (INDEPENDENT_AMBULATORY_CARE_PROVIDER_SITE_OTHER): Payer: Self-pay | Admitting: *Deleted

## 2013-06-18 DIAGNOSIS — Z124 Encounter for screening for malignant neoplasm of cervix: Secondary | ICD-10-CM

## 2013-06-18 DIAGNOSIS — Z113 Encounter for screening for infections with a predominantly sexual mode of transmission: Secondary | ICD-10-CM

## 2013-06-18 DIAGNOSIS — Z1231 Encounter for screening mammogram for malignant neoplasm of breast: Secondary | ICD-10-CM

## 2013-06-18 NOTE — Progress Notes (Addendum)
  Subjective:     Leighton RoachRobin S Balfour is a 53 y.o. woman who comes in today for a  pap smear only.  Previous abnormal Pap smears: no. Contraception:  condoms  Objective:    LMP 03/15/2013 Pelvic Exam: . Pap smear obtained.   Assessment:    Screening pap smear.   Plan:    Follow up in one year, or as indicated by Pap results.  Pt given educational materials re: HIV and women, self-esteem, BSE, nutrition and diet management, PAP smears and partner safety. Pt given condoms

## 2013-06-18 NOTE — Patient Instructions (Signed)
Your results will be ready in about a week.  I will mail them to you.  Thank you for coming to the Center for your care.  Tamora Huneke,  RN 

## 2013-06-21 ENCOUNTER — Other Ambulatory Visit: Payer: Self-pay | Admitting: *Deleted

## 2013-06-21 DIAGNOSIS — B20 Human immunodeficiency virus [HIV] disease: Secondary | ICD-10-CM

## 2013-06-21 DIAGNOSIS — B1710 Acute hepatitis C without hepatic coma: Secondary | ICD-10-CM

## 2013-06-21 MED ORDER — RITONAVIR 100 MG PO TABS: 100.0000 mg | ORAL_TABLET | Freq: Every day | ORAL | Status: DC

## 2013-06-21 MED ORDER — DARUNAVIR ETHANOLATE 800 MG PO TABS: 800.0000 mg | ORAL_TABLET | Freq: Every day | ORAL | Status: DC

## 2013-06-21 MED ORDER — EMTRICITABINE-TENOFOVIR DF 200-300 MG PO TABS: 1.0000 | ORAL_TABLET | Freq: Every day | ORAL | Status: DC

## 2013-06-22 ENCOUNTER — Other Ambulatory Visit: Payer: Self-pay | Admitting: Infectious Disease

## 2013-06-22 DIAGNOSIS — N6489 Other specified disorders of breast: Secondary | ICD-10-CM

## 2013-06-23 ENCOUNTER — Encounter: Payer: Self-pay | Admitting: Infectious Disease

## 2013-06-23 ENCOUNTER — Ambulatory Visit (INDEPENDENT_AMBULATORY_CARE_PROVIDER_SITE_OTHER): Payer: Self-pay | Admitting: Infectious Disease

## 2013-06-23 VITALS — BP 105/74 | HR 83 | Temp 98.2°F | Wt 149.5 lb

## 2013-06-23 DIAGNOSIS — F1090 Alcohol use, unspecified, uncomplicated: Secondary | ICD-10-CM

## 2013-06-23 DIAGNOSIS — B86 Scabies: Secondary | ICD-10-CM

## 2013-06-23 DIAGNOSIS — F1010 Alcohol abuse, uncomplicated: Secondary | ICD-10-CM

## 2013-06-23 DIAGNOSIS — B1920 Unspecified viral hepatitis C without hepatic coma: Secondary | ICD-10-CM

## 2013-06-23 DIAGNOSIS — B20 Human immunodeficiency virus [HIV] disease: Secondary | ICD-10-CM

## 2013-06-23 DIAGNOSIS — Z7289 Other problems related to lifestyle: Secondary | ICD-10-CM

## 2013-06-23 NOTE — Progress Notes (Signed)
Subjective:    Patient ID: Barbara Henry, female    DOB: 1961-03-07, 53 y.o.   MRN: [id number]  HPI   52y.o. female who is doing superbly well on her antiviral regimen, prezista, norvir and truvada with undetectable viral load and health cd4 count. 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   I saw her a few weeks ago in  clinic for complaints of a rash.   No when she was seen in clinic in the summer she also had a rash around her neck that was treated with triamcinolone presumably successfully.  We were concerned for scabies and the like her to be treated with topical permethrin and we referred her to [facility] but she could not make it due to misunderstanding re location.   We had extensive discussion re treatment of her Hep C via ACTG and she is anxious to do this.   Review of Systems  Constitutional: Negative for fever, chills, diaphoresis, activity change, appetite change, fatigue and unexpected weight change.  HENT: Negative for congestion, rhinorrhea, sinus pressure, sneezing, sore throat and trouble swallowing.   Eyes: Negative for photophobia and visual disturbance.  Respiratory: Negative for cough, chest tightness, shortness of breath, wheezing and stridor.   Cardiovascular: Negative for chest pain, palpitations and leg swelling.  Gastrointestinal: Negative for nausea, vomiting, abdominal pain, diarrhea, constipation, blood in stool, abdominal distention and anal bleeding.  Genitourinary: Negative for dysuria, hematuria, flank pain and difficulty urinating.  Musculoskeletal: Negative for arthralgias, back pain, gait problem, joint swelling and myalgias.  Skin: Positive for rash. Negative for color change, pallor and wound.  Neurological: Negative for dizziness, tremors, weakness and light-headedness.  Hematological: Negative for adenopathy. Does not bruise/bleed easily.  Psychiatric/Behavioral: Negative for behavioral problems, confusion, sleep disturbance,  dysphoric mood, decreased concentration and agitation.       Objective:   Physical Exam  Constitutional: She is oriented to person, place, and time. She appears well-developed and well-nourished. No distress.  HENT:  Head: Normocephalic and atraumatic.  Mouth/Throat: Oropharynx is clear and moist.  Eyes: Conjunctivae and EOM are normal.  Neck: Normal range of motion. Neck supple.  Cardiovascular: Normal rate and regular rhythm.   Pulmonary/Chest: Effort normal and breath sounds normal. No respiratory distress. She has no wheezes.  Abdominal: She exhibits no distension. There is no tenderness.  Musculoskeletal: She exhibits no edema.  Neurological: She is alert and oriented to person, place, and time. She has normal reflexes. She exhibits normal muscle tone. Coordination normal.  Skin: Skin is warm and dry. Rash noted. She is not diaphoretic. There is erythema.  Psychiatric: She has a normal mood and affect. Her behavior is normal. Judgment and thought content normal.   See above description              Assessment & Plan:   Rash: Sned to the [facility] aspirate be treated for scabies as well as her son. We'll see her back in a few weeks later have a second dose at in 2 weeks' time. If she fails to respond to scabies will consider empiric trial of topical steroids again and/or biopsy.  HIV: continue prezista,norvir truvada: She will renew for the AIDS drug assistance program for the next period. We may need to Change her aRVs to fit with Hep C drugs via ACTG. I spent greater than 25 minutes with the patient including greater than 50% of time in face to face counsel of the patient and  in coordination of their care.   Hep C: Will try to enroll in ACTG, she would seem to be candidate.  Etoh use: screened: When asked about substance use he denied using any illicit drug use. She does admit to drinking a 40 ounce bottle of beer approximately twice a week. I  counseled her that she needed to not do this during the study for treatment for hep C and that in general in the alcohol was about idea for her given the fact that her hep C likely are the cause damage to her liver. She was amenable to this.

## 2013-06-24 ENCOUNTER — Encounter: Payer: Self-pay | Admitting: *Deleted

## 2013-07-06 ENCOUNTER — Other Ambulatory Visit: Payer: Self-pay | Admitting: Licensed Clinical Social Worker

## 2013-07-06 DIAGNOSIS — B20 Human immunodeficiency virus [HIV] disease: Secondary | ICD-10-CM

## 2013-07-06 DIAGNOSIS — B1710 Acute hepatitis C without hepatic coma: Secondary | ICD-10-CM

## 2013-07-06 MED ORDER — RITONAVIR 100 MG PO TABS: 100.0000 mg | ORAL_TABLET | Freq: Every day | ORAL | Status: DC

## 2013-07-06 MED ORDER — DARUNAVIR ETHANOLATE 800 MG PO TABS: 800.0000 mg | ORAL_TABLET | Freq: Every day | ORAL | Status: DC

## 2013-07-06 MED ORDER — EMTRICITABINE-TENOFOVIR DF 200-300 MG PO TABS: 1.0000 | ORAL_TABLET | Freq: Every day | ORAL | Status: DC

## 2013-08-23 ENCOUNTER — Ambulatory Visit: Payer: Self-pay | Admitting: Infectious Disease

## 2013-08-31 ENCOUNTER — Other Ambulatory Visit (HOSPITAL_COMMUNITY)
Admission: RE | Admit: 2013-08-31 | Discharge: 2013-08-31 | Disposition: A | Discharge status: Home / Self Care | Payer: Medicaid Other | Source: Ambulatory Visit | Attending: Infectious Disease | Admitting: Infectious Disease

## 2013-08-31 ENCOUNTER — Other Ambulatory Visit: Payer: Medicaid Other

## 2013-08-31 DIAGNOSIS — Z113 Encounter for screening for infections with a predominantly sexual mode of transmission: Secondary | ICD-10-CM | POA: Insufficient documentation

## 2013-08-31 DIAGNOSIS — B20 Human immunodeficiency virus [HIV] disease: Secondary | ICD-10-CM

## 2013-08-31 LAB — COMPLETE METABOLIC PANEL WITH GFR
ALT: 58 U/L — AB (ref 0–35)
AST: 66 U/L — AB (ref 0–37)
Albumin: 4.3 g/dL (ref 3.5–5.2)
Alkaline Phosphatase: 117 U/L (ref 39–117)
BILIRUBIN TOTAL: 0.4 mg/dL (ref 0.2–1.2)
BUN: 10 mg/dL (ref 6–23)
CHLORIDE: 102 meq/L (ref 96–112)
CO2: 25 meq/L (ref 19–32)
Calcium: 9.6 mg/dL (ref 8.4–10.5)
Creat: 0.84 mg/dL (ref 0.50–1.10)
GFR, Est Non African American: 81 mL/min
Glucose, Bld: 61 mg/dL — ABNORMAL LOW (ref 70–99)
Potassium: 3.8 mEq/L (ref 3.5–5.3)
SODIUM: 138 meq/L (ref 135–145)
TOTAL PROTEIN: 7.9 g/dL (ref 6.0–8.3)

## 2013-08-31 LAB — CBC WITH DIFFERENTIAL/PLATELET
BASOS PCT: 1 % (ref 0–1)
Basophils Absolute: 0 10*3/uL (ref 0.0–0.1)
EOS PCT: 4 % (ref 0–5)
Eosinophils Absolute: 0.1 10*3/uL (ref 0.0–0.7)
HCT: 34.2 % — ABNORMAL LOW (ref 36.0–46.0)
HEMOGLOBIN: 11.9 g/dL — AB (ref 12.0–15.0)
Lymphocytes Relative: 34 % (ref 12–46)
Lymphs Abs: 1 10*3/uL (ref 0.7–4.0)
MCH: 32.2 pg (ref 26.0–34.0)
MCHC: 34.8 g/dL (ref 30.0–36.0)
MCV: 92.7 fL (ref 78.0–100.0)
MONO ABS: 0.5 10*3/uL (ref 0.1–1.0)
Monocytes Relative: 18 % — ABNORMAL HIGH (ref 3–12)
Neutro Abs: 1.2 10*3/uL — ABNORMAL LOW (ref 1.7–7.7)
Neutrophils Relative %: 43 % (ref 43–77)
Platelets: 165 10*3/uL (ref 150–400)
RBC: 3.69 MIL/uL — ABNORMAL LOW (ref 3.87–5.11)
RDW: 14.7 % (ref 11.5–15.5)
WBC: 2.9 10*3/uL — AB (ref 4.0–10.5)

## 2013-09-01 LAB — T-HELPER CELL (CD4) - (RCID CLINIC ONLY)
CD4 % Helper T Cell: 42 % (ref 33–55)
CD4 T Cell Abs: 430 /uL (ref 400–2700)

## 2013-09-01 LAB — URINE CYTOLOGY ANCILLARY ONLY
Chlamydia: NEGATIVE
NEISSERIA GONORRHEA: NEGATIVE

## 2013-09-01 LAB — MICROALBUMIN / CREATININE URINE RATIO
CREATININE, URINE: 46.8 mg/dL
Microalb Creat Ratio: 10.7 mg/g (ref 0.0–30.0)
Microalb, Ur: 0.5 mg/dL (ref 0.00–1.89)

## 2013-09-01 LAB — HIV-1 RNA QUANT-NO REFLEX-BLD

## 2013-09-10 LAB — HLA B*5701: HLA-B*5701 w/rflx HLA-B High: NEGATIVE

## 2013-09-20 ENCOUNTER — Other Ambulatory Visit: Payer: Self-pay

## 2013-10-11 ENCOUNTER — Encounter: Payer: Self-pay | Admitting: Infectious Disease

## 2013-10-11 ENCOUNTER — Ambulatory Visit (INDEPENDENT_AMBULATORY_CARE_PROVIDER_SITE_OTHER): Payer: Medicaid Other | Admitting: Infectious Disease

## 2013-10-11 VITALS — BP 126/79 | HR 89 | Temp 97.4°F | Wt 146.0 lb

## 2013-10-11 DIAGNOSIS — Z7289 Other problems related to lifestyle: Secondary | ICD-10-CM

## 2013-10-11 DIAGNOSIS — F1010 Alcohol abuse, uncomplicated: Secondary | ICD-10-CM

## 2013-10-11 DIAGNOSIS — H6690 Otitis media, unspecified, unspecified ear: Secondary | ICD-10-CM

## 2013-10-11 DIAGNOSIS — B20 Human immunodeficiency virus [HIV] disease: Secondary | ICD-10-CM

## 2013-10-11 DIAGNOSIS — B9689 Other specified bacterial agents as the cause of diseases classified elsewhere: Secondary | ICD-10-CM

## 2013-10-11 DIAGNOSIS — H6590 Unspecified nonsuppurative otitis media, unspecified ear: Secondary | ICD-10-CM

## 2013-10-11 DIAGNOSIS — J019 Acute sinusitis, unspecified: Secondary | ICD-10-CM

## 2013-10-11 DIAGNOSIS — B1920 Unspecified viral hepatitis C without hepatic coma: Secondary | ICD-10-CM

## 2013-10-11 DIAGNOSIS — B1710 Acute hepatitis C without hepatic coma: Secondary | ICD-10-CM

## 2013-10-11 DIAGNOSIS — F1090 Alcohol use, unspecified, uncomplicated: Secondary | ICD-10-CM

## 2013-10-11 MED ORDER — ABACAVIR-DOLUTEGRAVIR-LAMIVUD 600-50-300 MG PO TABS: 1.0000 | ORAL_TABLET | Freq: Every day | ORAL | Status: DC

## 2013-10-11 MED ORDER — AMOXICILLIN 500 MG PO TABS: 500.0000 mg | ORAL_TABLET | Freq: Two times a day (BID) | ORAL | Status: DC

## 2013-10-11 NOTE — Progress Notes (Signed)
Subjective:    Patient ID: Barbara Henry, female    DOB: [birth year], 53 y.o.   MRN: [id number]  HPI   53 .year old female who is doing superbly well on her antiviral regimen, prezista, norvir and truvada with undetectable viral load and health cd4 count. 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   She has had some episodes of viremia in the past largely related to her meds not being filled do to her not enrolling in a gap or reenroll and rather in a timely fashion. She has no documented resistance mutations seen in the chart.  Forcefully due to her via episodes of viremia she would not qualify for the ACTG study treatment for Hep c.  We [facility] so that she can then be treated with HARVONI for her hepatitis C.  Since I last saw her she has developed some ear pain with drainage from her left ear also left-sided facial pain with tenderness to palpation of her maxilla. She has not been on antibiotics has not seen sought treatment. She did have fevers a few days ago.   Review of Systems  Constitutional: Negative for fever, chills, diaphoresis, activity change, appetite change, fatigue and unexpected weight change.  HENT: Positive for ear discharge, ear pain, facial swelling, hearing loss and sinus pressure. Negative for congestion, rhinorrhea, sneezing, sore throat and trouble swallowing.   Eyes: Negative for photophobia and visual disturbance.  Respiratory: Negative for cough, chest tightness, shortness of breath, wheezing and stridor.   Cardiovascular: Negative for chest pain, palpitations and leg swelling.  Gastrointestinal: Negative for nausea, vomiting, abdominal pain, diarrhea, constipation, blood in stool, abdominal distention and anal bleeding.  Genitourinary: Negative for dysuria, hematuria, flank pain and difficulty urinating.  Musculoskeletal: Negative for arthralgias, back pain, gait problem, joint swelling and myalgias.    Skin: Positive for rash. Negative for color change, pallor and wound.  Neurological: Negative for dizziness, tremors, weakness and light-headedness.  Hematological: Negative for adenopathy. Does not bruise/bleed easily.  Psychiatric/Behavioral: Negative for behavioral problems, confusion, sleep disturbance, dysphoric mood, decreased concentration and agitation.       Objective:   Physical Exam  Constitutional: She is oriented to person, place, and time. She appears well-developed and well-nourished. No distress.  HENT:  Head: Normocephalic and atraumatic.    Right Ear: A middle ear effusion is present.  Left Ear: There is swelling. A middle ear effusion is present.  Mouth/Throat: Oropharynx is clear and moist.  Eyes: Conjunctivae and EOM are normal.  Neck: Normal range of motion. Neck supple.  Cardiovascular: Normal rate and regular rhythm.   Pulmonary/Chest: Effort normal and breath sounds normal. No respiratory distress. She has no wheezes.  Abdominal: She exhibits no distension. There is no tenderness.  Musculoskeletal: She exhibits no edema.  Neurological: She is alert and oriented to person, place, and time. She has normal reflexes. She exhibits normal muscle tone. Coordination normal.  Skin: Skin is warm and dry. She is not diaphoretic.  Psychiatric: She has a normal mood and affect. Her behavior is normal. Judgment and thought content normal.      Assessment & Plan:    HIV: change to TRIUMEQ and recheck labs in one month  Hep C: check Hep C VL and calculate APRI score = 0. 690 I spent greater than 40 minutes with the patient including greater than 50% of time in face to face counsel of the patient and in coordination of their  care.   Etoh use: screened denies drinking or doing any drugs, denies any hx of IVDU, in past did drink two 40 oz beers per week  Sinusitis: give amoxicillin 503 times daily for 10 days.

## 2013-10-11 NOTE — Patient Instructions (Signed)
WE ARE GIVING YOU AMOXICILLIN 500MG  THREE TIMES A DAY FOR YOUR SINUS INFECTION  IF YOUR FACIAL PAIN DOESN'T IMPROVE, OR EAR PAIN DOES NOT  COME BACK TO SEE US  WE ARE CHANGING YOUR ARV MEDICINE TO ONE PURPLE PILL:  TRIUMEQ ONCE A DAY WITH OR WITHOUT FOOD  WHEN YOU GET THIS MEDICINE WE CAN STOP THE OTHER THREE MEDICINES PREZISTA, NORVIR AND TRUVADA  WE ARE GOING TO WORK TO GET YOU A DRUG CALLED HARVONI TO CURE YOUR HEP C

## 2013-10-12 LAB — HEPATITIS C RNA QUANTITATIVE
HCV QUANT: 13838213 [IU]/mL — AB (ref <15)
HCV Quantitative Log: 7.14 {Log} — ABNORMAL HIGH (ref <1.18)

## 2013-10-20 ENCOUNTER — Telehealth: Payer: Self-pay | Admitting: Pharmacist

## 2013-10-20 NOTE — Telephone Encounter (Signed)
Left message for patient to come to clinic to sign patient assistance forms for Harvoni.

## 2013-11-15 ENCOUNTER — Encounter: Payer: Self-pay | Admitting: Infectious Disease

## 2013-11-15 ENCOUNTER — Ambulatory Visit (INDEPENDENT_AMBULATORY_CARE_PROVIDER_SITE_OTHER): Payer: Medicaid Other | Admitting: Infectious Disease

## 2013-11-15 VITALS — BP 114/79 | HR 80 | Temp 97.7°F | Wt 144.5 lb

## 2013-11-15 DIAGNOSIS — H65199 Other acute nonsuppurative otitis media, unspecified ear: Secondary | ICD-10-CM

## 2013-11-15 DIAGNOSIS — B20 Human immunodeficiency virus [HIV] disease: Secondary | ICD-10-CM

## 2013-11-15 DIAGNOSIS — R109 Unspecified abdominal pain: Secondary | ICD-10-CM

## 2013-11-15 DIAGNOSIS — B1920 Unspecified viral hepatitis C without hepatic coma: Secondary | ICD-10-CM

## 2013-11-15 LAB — COMPLETE METABOLIC PANEL WITHOUT GFR
ALT: 61 U/L — ABNORMAL HIGH (ref 0–35)
AST: 57 U/L — ABNORMAL HIGH (ref 0–37)
Albumin: 4.1 g/dL (ref 3.5–5.2)
Alkaline Phosphatase: 117 U/L (ref 39–117)
BUN: 9 mg/dL (ref 6–23)
CO2: 29 meq/L (ref 19–32)
Calcium: 9.9 mg/dL (ref 8.4–10.5)
Chloride: 100 meq/L (ref 96–112)
Creat: 1.25 mg/dL — ABNORMAL HIGH (ref 0.50–1.10)
GFR, Est African American: 58 mL/min — ABNORMAL LOW
GFR, Est Non African American: 50 mL/min — ABNORMAL LOW
Glucose, Bld: 70 mg/dL (ref 70–99)
Potassium: 4 meq/L (ref 3.5–5.3)
Sodium: 137 meq/L (ref 135–145)
Total Bilirubin: 0.5 mg/dL (ref 0.2–1.2)
Total Protein: 7.7 g/dL (ref 6.0–8.3)

## 2013-11-15 LAB — CBC WITH DIFFERENTIAL/PLATELET
Basophils Absolute: 0 K/uL (ref 0.0–0.1)
Basophils Relative: 0 % (ref 0–1)
Eosinophils Absolute: 0.1 K/uL (ref 0.0–0.7)
Eosinophils Relative: 3 % (ref 0–5)
HCT: 33.8 % — ABNORMAL LOW (ref 36.0–46.0)
Hemoglobin: 11.4 g/dL — ABNORMAL LOW (ref 12.0–15.0)
Lymphocytes Relative: 19 % (ref 12–46)
Lymphs Abs: 0.7 K/uL (ref 0.7–4.0)
MCH: 32.4 pg (ref 26.0–34.0)
MCHC: 33.7 g/dL (ref 30.0–36.0)
MCV: 96 fL (ref 78.0–100.0)
Monocytes Absolute: 0.7 K/uL (ref 0.1–1.0)
Monocytes Relative: 20 % — ABNORMAL HIGH (ref 3–12)
Neutro Abs: 2 K/uL (ref 1.7–7.7)
Neutrophils Relative %: 58 % (ref 43–77)
Platelets: 138 K/uL — ABNORMAL LOW (ref 150–400)
RBC: 3.52 MIL/uL — ABNORMAL LOW (ref 3.87–5.11)
RDW: 15.3 % (ref 11.5–15.5)
WBC: 3.5 K/uL — ABNORMAL LOW (ref 4.0–10.5)

## 2013-11-15 MED ORDER — AMOXICILLIN 500 MG PO CAPS: 500.0000 mg | ORAL_CAPSULE | Freq: Three times a day (TID) | ORAL | Status: DC

## 2013-11-15 NOTE — Progress Notes (Signed)
Subjective:    Patient ID: Barbara Henry, female    DOB: 1960-08-09, 53 y.o.   MRN: [id number]  Hip Pain  The incident occurred 3 to 5 days ago. There was no injury mechanism. The pain is present in the left hip. The quality of the pain is described as aching. The pain is at a severity of 4/10. The pain is mild. The pain has been fluctuating since onset. Pertinent negatives include no inability to bear weight, loss of motion, loss of sensation, muscle weakness, numbness or tingling. She has tried NSAIDs for the symptoms. The treatment provided mild relief.  Ear Drainage  Associated symptoms include ear discharge and hearing loss. Pertinent negatives include no abdominal pain, coughing, diarrhea, rhinorrhea, sore throat or vomiting.    [age] .year old female who had been doing superbly well on her antiviral regimen, prezista, norvir and truvada with undetectable viral load and health cd4 count. We had [facility] to facilitate her being treated for Hep C without coma with HARVONI.  She continues to have left ear pain and drainage and only had been taking her amoxicillin at night rather than TID.  Finally she has develop some left sided flank pain of unclear significance.   Review of Systems  Constitutional: Negative for fever, chills, diaphoresis, activity change, appetite change, fatigue and unexpected weight change.  HENT: Positive for ear discharge, ear pain, facial swelling, hearing loss and sinus pressure. Negative for congestion, rhinorrhea, sneezing, sore throat and trouble swallowing.   Eyes: Negative for photophobia and visual disturbance.  Respiratory: Negative for cough, chest tightness, shortness of breath, wheezing and stridor.   Cardiovascular: Negative for chest pain, palpitations and leg swelling.  Gastrointestinal: Negative for nausea, vomiting, abdominal pain, diarrhea, constipation, blood in stool, abdominal distention and anal bleeding.  Genitourinary: Positive for  flank pain. Negative for dysuria, hematuria and difficulty urinating.  Musculoskeletal: Negative for arthralgias, back pain, gait problem, joint swelling and myalgias.  Skin: Negative for color change, pallor and wound.  Neurological: Negative for dizziness, tingling, tremors, weakness, light-headedness and numbness.  Hematological: Negative for adenopathy. Does not bruise/bleed easily.  Psychiatric/Behavioral: Negative for behavioral problems, confusion, sleep disturbance, dysphoric mood, decreased concentration and agitation.       Objective:   Physical Exam  Constitutional: She is oriented to person, place, and time. She appears well-developed and well-nourished. No distress.  HENT:  Head: Normocephalic and atraumatic.    Right Ear: A middle ear effusion is present.  Left Ear: There is swelling. A middle ear effusion is present.  Mouth/Throat: Oropharynx is clear and moist.  Eyes: Conjunctivae and EOM are normal.  Neck: Normal range of motion. Neck supple.  Cardiovascular: Normal rate, regular rhythm and normal heart sounds.  Exam reveals no gallop and no friction rub.   No murmur heard. Pulmonary/Chest: Effort normal and breath sounds normal. No respiratory distress. She has no wheezes.  Abdominal: She exhibits no distension. There is no tenderness.  Musculoskeletal: She exhibits no edema.  Neurological: She is alert and oriented to person, place, and time. She has normal reflexes. She exhibits normal muscle tone. Coordination normal.  Skin: Skin is warm and dry. She is not diaphoretic.  Psychiatric: She has a normal mood and affect. Her behavior is normal. Judgment and thought content normal.      Assessment & Plan:    HIV: continue TRIUMEQ and recheck labs today. I spent greater than 25 minutes with the patient including greater than 50% of time in  face to face counsel of the patient and in coordination of their care.   Hep C:  High viral load  and calculate APRI score = 0.  690 We have filled out [facility] pt assitance forms and she has signed them, will send off to [facility] and hopefully have Harvoni soon   Etoh use: screened denies drinking or doing any drugs, denies any hx of IVDU, in past did drink two 40 oz beers per week  Ear drainage: re-wrote for amox but TID and fu in one month  Flank pain: not sure what to make of this. Nothing pathological on exam

## 2013-11-16 LAB — HIV-1 RNA QUANT-NO REFLEX-BLD: HIV-1 RNA Quant, Log: <1.3 {Log} (ref <1.30)

## 2013-11-16 LAB — T-HELPER CELL (CD4) - (RCID CLINIC ONLY)
CD4 % Helper T Cell: 42 % (ref 33–55)
CD4 T CELL ABS: 310 /uL — AB (ref 400–2700)

## 2013-12-15 ENCOUNTER — Ambulatory Visit: Payer: Medicaid Other

## 2013-12-15 ENCOUNTER — Ambulatory Visit: Payer: Medicaid Other | Admitting: Infectious Disease

## 2013-12-21 ENCOUNTER — Encounter: Payer: Self-pay | Admitting: Infectious Disease

## 2013-12-21 ENCOUNTER — Ambulatory Visit (INDEPENDENT_AMBULATORY_CARE_PROVIDER_SITE_OTHER): Payer: Self-pay | Admitting: Infectious Disease

## 2013-12-21 VITALS — BP 107/73 | HR 108 | Temp 97.7°F | Wt 152.0 lb

## 2013-12-21 DIAGNOSIS — H9212 Otorrhea, left ear: Secondary | ICD-10-CM

## 2013-12-21 DIAGNOSIS — B182 Chronic viral hepatitis C: Secondary | ICD-10-CM

## 2013-12-21 DIAGNOSIS — R109 Unspecified abdominal pain: Secondary | ICD-10-CM

## 2013-12-21 DIAGNOSIS — H9210 Otorrhea, unspecified ear: Secondary | ICD-10-CM

## 2013-12-21 DIAGNOSIS — B20 Human immunodeficiency virus [HIV] disease: Secondary | ICD-10-CM

## 2013-12-21 MED ORDER — LEDIPASVIR-SOFOSBUVIR 90-400 MG PO TABS: 1.0000 | ORAL_TABLET | Freq: Every day | ORAL | Status: DC

## 2013-12-21 NOTE — Progress Notes (Signed)
Subjective:    Patient ID: Barbara Henry, female    DOB: [birth year], 53 y.o.   MRN: [id number]  Hip Pain  The incident occurred 3 to 5 days ago. There was no injury mechanism. The pain is present in the left hip. The quality of the pain is described as aching. The pain is at a severity of 4/10. The pain is mild. The pain has been fluctuating since onset. Pertinent negatives include no inability to bear weight, loss of motion, loss of sensation, muscle weakness, numbness or tingling. She has tried NSAIDs for the symptoms. The treatment provided mild relief.  Ear Drainage  Associated symptoms include ear discharge and hearing loss. Pertinent negatives include no abdominal pain, coughing, diarrhea, rhinorrhea, sore throat or vomiting.    53 .year old female who had been doing superbly well on her antiviral regimen, prezista, norvir and truvada with undetectable viral load and health cd4 count. We had changed her to [facility] to facilitate her being treated for Hep C without coma with HARVONI and she told undetectable viral load and healthy CD4 count.   Lab Results  Component Value Date   HIV1RNAQUANT <20 11/15/2013   Lab Results  Component Value Date   CD4TABS 310* 11/15/2013   CD4TABS 430 08/31/2013   CD4TABS 380* 06/09/2013   She also continues to complain of left-sided ear pain and otorrhea that is failed to resolve after one month of high-dose amoxicillin.  Finally she has develop some left sided flank pain of yet again but no dysuria.  She has been started on HARVONI and is approximately 24 days into therapy. She is receiving the medicine through the [facility] patient assistance program. She is tolerating medicines without any difficulties.  Review of Systems  Constitutional: Negative for fever, chills, diaphoresis, activity change, appetite change, fatigue and unexpected weight change.  HENT: Positive for ear discharge, ear pain, facial swelling, hearing loss and sinus pressure. Negative for  congestion, rhinorrhea, sneezing, sore throat and trouble swallowing.   Eyes: Negative for photophobia and visual disturbance.  Respiratory: Negative for cough, chest tightness, shortness of breath, wheezing and stridor.   Cardiovascular: Negative for chest pain, palpitations and leg swelling.  Gastrointestinal: Negative for nausea, vomiting, abdominal pain, diarrhea, constipation, blood in stool, abdominal distention and anal bleeding.  Genitourinary: Positive for flank pain. Negative for dysuria, hematuria and difficulty urinating.  Musculoskeletal: Negative for arthralgias, back pain, gait problem, joint swelling and myalgias.  Skin: Negative for color change, pallor and wound.  Neurological: Negative for dizziness, tingling, tremors, weakness, light-headedness and numbness.  Hematological: Negative for adenopathy. Does not bruise/bleed easily.  Psychiatric/Behavioral: Negative for behavioral problems, confusion, sleep disturbance, dysphoric mood, decreased concentration and agitation.       Objective:   Physical Exam  Constitutional: She is oriented to person, place, and time. She appears well-developed and well-nourished. No distress.  HENT:  Head: Normocephalic and atraumatic.    Left Ear: There is drainage and swelling. A middle ear effusion is present. Decreased hearing is noted.  Mouth/Throat: Oropharynx is clear and moist.  Eyes: Conjunctivae and EOM are normal.  Neck: Normal range of motion. Neck supple.  Cardiovascular: Normal rate, regular rhythm and normal heart sounds.  Exam reveals no gallop and no friction rub.   No murmur heard. Pulmonary/Chest: Effort normal and breath sounds normal. No respiratory distress. She has no wheezes.  Abdominal: She exhibits no distension. There is no tenderness.  Musculoskeletal: She exhibits no edema.  Neurological: She is alert and oriented  to person, place, and time. She has normal reflexes. She exhibits normal muscle tone. Coordination  normal.  Skin: Skin is warm and dry. She is not diaphoretic.  Psychiatric: She has a normal mood and affect. Her behavior is normal. Judgment and thought content normal.      Assessment & Plan:    HIV: continue TRIUMEQ, recheck labs in 3-4 months   Hep C:  On HARVONI. Check CBC and metabolic panel and hep C viral load at next week. We'll and check for cure in approximately 5 months time i.e. 3 months after she finishes her her HARVONI. I spent greater than 25 minutes with the patient including greater than 50% of time in face to face counsel of the patient and in coordination of their care.   Ear drainage: Could be due to perforation am referring to ear nose and throat and checking a CT of the temporal bones  Flank pain: not sure what to make of this. Check urinalysis and culture though it's not clear cut to be in urinary tract infection symptom wise

## 2013-12-29 ENCOUNTER — Other Ambulatory Visit: Payer: Medicaid Other

## 2013-12-29 DIAGNOSIS — B182 Chronic viral hepatitis C: Secondary | ICD-10-CM

## 2013-12-29 LAB — CBC WITH DIFFERENTIAL/PLATELET
Basophils Absolute: 0 10*3/uL (ref 0.0–0.1)
Basophils Relative: 1 % (ref 0–1)
EOS ABS: 0.1 10*3/uL (ref 0.0–0.7)
EOS PCT: 2 % (ref 0–5)
HEMATOCRIT: 33 % — AB (ref 36.0–46.0)
HEMOGLOBIN: 11.3 g/dL — AB (ref 12.0–15.0)
LYMPHS ABS: 1 10*3/uL (ref 0.7–4.0)
LYMPHS PCT: 33 % (ref 12–46)
MCH: 32 pg (ref 26.0–34.0)
MCHC: 34.2 g/dL (ref 30.0–36.0)
MCV: 93.5 fL (ref 78.0–100.0)
MONO ABS: 0.6 10*3/uL (ref 0.1–1.0)
MONOS PCT: 19 % — AB (ref 3–12)
Neutro Abs: 1.4 10*3/uL — ABNORMAL LOW (ref 1.7–7.7)
Neutrophils Relative %: 45 % (ref 43–77)
Platelets: 222 10*3/uL (ref 150–400)
RBC: 3.53 MIL/uL — AB (ref 3.87–5.11)
RDW: 14.9 % (ref 11.5–15.5)
WBC: 3 10*3/uL — AB (ref 4.0–10.5)

## 2013-12-29 LAB — COMPLETE METABOLIC PANEL WITH GFR
ALK PHOS: 89 U/L (ref 39–117)
ALT: 26 U/L (ref 0–35)
AST: 29 U/L (ref 0–37)
Albumin: 4.1 g/dL (ref 3.5–5.2)
BUN: 12 mg/dL (ref 6–23)
CHLORIDE: 104 meq/L (ref 96–112)
CO2: 27 mEq/L (ref 19–32)
CREATININE: 0.98 mg/dL (ref 0.50–1.10)
Calcium: 9.5 mg/dL (ref 8.4–10.5)
GFR, Est African American: 77 mL/min
GFR, Est Non African American: 67 mL/min
Glucose, Bld: 84 mg/dL (ref 70–99)
Potassium: 4.5 mEq/L (ref 3.5–5.3)
Sodium: 139 mEq/L (ref 135–145)
Total Bilirubin: 0.4 mg/dL (ref 0.2–1.2)
Total Protein: 7.6 g/dL (ref 6.0–8.3)

## 2013-12-29 NOTE — Addendum Note (Signed)
Addended by: Mariea ClontsGREEN, Trease Bremner D on: 12/29/2013 10:08 AM   Modules accepted: Orders

## 2013-12-31 LAB — HEPATITIS C RNA QUANTITATIVE: HCV Quantitative: NOT DETECTED IU/mL (ref <15)

## 2014-01-24 ENCOUNTER — Ambulatory Visit (INDEPENDENT_AMBULATORY_CARE_PROVIDER_SITE_OTHER): Payer: Medicaid Other | Admitting: Infectious Disease

## 2014-01-24 ENCOUNTER — Encounter: Payer: Self-pay | Admitting: Infectious Disease

## 2014-01-24 VITALS — BP 138/91 | HR 73 | Temp 97.9°F | Wt 157.5 lb

## 2014-01-24 DIAGNOSIS — H9209 Otalgia, unspecified ear: Secondary | ICD-10-CM

## 2014-01-24 DIAGNOSIS — Z23 Encounter for immunization: Secondary | ICD-10-CM

## 2014-01-24 DIAGNOSIS — D649 Anemia, unspecified: Secondary | ICD-10-CM | POA: Insufficient documentation

## 2014-01-24 DIAGNOSIS — H9203 Otalgia, bilateral: Secondary | ICD-10-CM

## 2014-01-24 DIAGNOSIS — D509 Iron deficiency anemia, unspecified: Secondary | ICD-10-CM

## 2014-01-24 DIAGNOSIS — B20 Human immunodeficiency virus [HIV] disease: Secondary | ICD-10-CM

## 2014-01-24 NOTE — Progress Notes (Signed)
Subjective:    Patient ID: Barbara Henry, female    DOB: Dec 13, 1960, 53 y.o.   MRN: [id number]  Ear Drainage  Pertinent negatives include no abdominal pain, coughing, diarrhea, rhinorrhea, sore throat or vomiting.    53 year old female who had been doing superbly well on her antiviral regimen, prezista, norvir and truvada with undetectable viral load and health cd4 count. We had changed her to [facility] to facilitate her being treated for Hep C without coma with HARVONI and she had undetectable viral load and healthy CD4 count.   Lab Results  Component Value Date   HIV1RNAQUANT <20 11/15/2013   Lab Results  Component Value Date   CD4TABS 310* 11/15/2013   CD4TABS 430 08/31/2013   CD4TABS 380* 06/09/2013   She still has some ear pain especially in the left ear. On talking to her further sounds like a chronic complaint it happens and corresponds to some other allergic symptoms. We'll continue observation for now I think she would only benefit by being seen by an ear nose and throat specialist.  Review of Systems  Constitutional: Negative for fever, chills, diaphoresis, activity change, appetite change, fatigue and unexpected weight change.  HENT: Positive for ear pain. Negative for congestion, rhinorrhea, sneezing, sore throat and trouble swallowing.   Eyes: Negative for photophobia and visual disturbance.  Respiratory: Negative for cough, chest tightness, shortness of breath, wheezing and stridor.   Cardiovascular: Negative for chest pain, palpitations and leg swelling.  Gastrointestinal: Negative for nausea, vomiting, abdominal pain, diarrhea, constipation, blood in stool, abdominal distention and anal bleeding.  Genitourinary: Negative for dysuria, hematuria and difficulty urinating.  Musculoskeletal: Negative for arthralgias, back pain, gait problem, joint swelling and myalgias.  Skin: Negative for color change, pallor and wound.  Neurological: Negative for dizziness, tremors, weakness  and light-headedness.  Hematological: Negative for adenopathy. Does not bruise/bleed easily.  Psychiatric/Behavioral: Negative for behavioral problems, confusion, sleep disturbance, dysphoric mood, decreased concentration and agitation.       Objective:   Physical Exam  Constitutional: She is oriented to person, place, and time. She appears well-developed and well-nourished. No distress.  HENT:  Head: Normocephalic and atraumatic.  Right Ear: No drainage or tenderness. No middle ear effusion.  Left Ear: No drainage. A middle ear effusion is present.  Mouth/Throat: Oropharynx is clear and moist.  Eyes: Conjunctivae and EOM are normal.  Neck: Normal range of motion. Neck supple.  Cardiovascular: Normal rate, regular rhythm and normal heart sounds.  Exam reveals no gallop and no friction rub.   No murmur heard. Pulmonary/Chest: Effort normal and breath sounds normal. No respiratory distress. She has no wheezes.  Abdominal: She exhibits no distension. There is no tenderness.  Musculoskeletal: She exhibits no edema.  Neurological: She is alert and oriented to person, place, and time. She has normal reflexes. She exhibits normal muscle tone. Coordination normal.  Skin: Skin is warm and dry. She is not diaphoretic.  Psychiatric: She has a normal mood and affect. Her behavior is normal. Judgment and thought content normal.      Assessment & Plan:    HIV: continue TRIUMEQ, recheck labs in 3-4 months   Hep C:  On HARVONI. Check labs 2 months post completion of therapy.   Ear drainage: Has improved it picks up again I would like a CT of her ears and get an ear nose and throat referral  Anemia: She is concerned she may have iron deficiency anemia will check an iron panel as well as  a folate and B12

## 2014-01-25 LAB — IBC PANEL
%SAT: 55 % (ref 20–55)
TIBC: 378 ug/dL (ref 250–470)
UIBC: 170 ug/dL (ref 125–400)

## 2014-01-25 LAB — FOLATE: FOLATE: 15.1 ng/mL

## 2014-01-25 LAB — VITAMIN B12: VITAMIN B 12: 501 pg/mL (ref 211–911)

## 2014-01-25 LAB — IRON: IRON: 208 ug/dL — AB (ref 42–145)

## 2014-01-25 LAB — FERRITIN: Ferritin: 97 ng/mL (ref 10–291)

## 2014-01-31 ENCOUNTER — Other Ambulatory Visit: Payer: Self-pay | Admitting: *Deleted

## 2014-01-31 DIAGNOSIS — B1710 Acute hepatitis C without hepatic coma: Secondary | ICD-10-CM

## 2014-01-31 MED ORDER — ABACAVIR-DOLUTEGRAVIR-LAMIVUD 600-50-300 MG PO TABS: 1.0000 | ORAL_TABLET | Freq: Every day | ORAL | Status: DC

## 2014-01-31 NOTE — Progress Notes (Signed)
ADAP 

## 2014-02-09 ENCOUNTER — Telehealth: Payer: Self-pay | Admitting: *Deleted

## 2014-02-09 ENCOUNTER — Other Ambulatory Visit: Payer: Self-pay | Admitting: Infectious Disease

## 2014-02-09 NOTE — Telephone Encounter (Signed)
Spoke with Gay Filler, pharmacist.  Pt is completing the 3rd month of Harvoni.  Does not need any additional refills.  Spoke with the pt to let her know that she is finishing her Hep C treatment and will not need any additional refills.  Pt verbalized understanding.

## 2014-04-07 ENCOUNTER — Telehealth: Payer: Self-pay | Admitting: *Deleted

## 2014-04-07 NOTE — Telephone Encounter (Signed)
[facility] called to confirm contact information. They have left messages at the correct number.

## 2014-04-26 ENCOUNTER — Other Ambulatory Visit: Payer: Medicaid Other

## 2014-05-03 ENCOUNTER — Other Ambulatory Visit: Payer: Medicaid Other

## 2014-05-03 DIAGNOSIS — B20 Human immunodeficiency virus [HIV] disease: Secondary | ICD-10-CM

## 2014-05-03 LAB — COMPLETE METABOLIC PANEL WITH GFR
ALT: 34 U/L (ref 0–35)
AST: 50 U/L — AB (ref 0–37)
Albumin: 4.2 g/dL (ref 3.5–5.2)
Alkaline Phosphatase: 90 U/L (ref 39–117)
BILIRUBIN TOTAL: 0.5 mg/dL (ref 0.2–1.2)
BUN: 14 mg/dL (ref 6–23)
CO2: 26 mEq/L (ref 19–32)
CREATININE: 0.99 mg/dL (ref 0.50–1.10)
Calcium: 9.4 mg/dL (ref 8.4–10.5)
Chloride: 102 mEq/L (ref 96–112)
GFR, EST AFRICAN AMERICAN: 76 mL/min
GFR, Est Non African American: 66 mL/min
Glucose, Bld: 84 mg/dL (ref 70–99)
Potassium: 3.6 mEq/L (ref 3.5–5.3)
Sodium: 138 mEq/L (ref 135–145)
Total Protein: 7.4 g/dL (ref 6.0–8.3)

## 2014-05-03 LAB — CBC WITH DIFFERENTIAL/PLATELET
Basophils Absolute: 0 10*3/uL (ref 0.0–0.1)
Basophils Relative: 0 % (ref 0–1)
Eosinophils Absolute: 0.1 10*3/uL (ref 0.0–0.7)
Eosinophils Relative: 4 % (ref 0–5)
HEMATOCRIT: 34.6 % — AB (ref 36.0–46.0)
Hemoglobin: 12.2 g/dL (ref 12.0–15.0)
Lymphocytes Relative: 29 % (ref 12–46)
Lymphs Abs: 0.8 10*3/uL (ref 0.7–4.0)
MCH: 32.6 pg (ref 26.0–34.0)
MCHC: 35.3 g/dL (ref 30.0–36.0)
MCV: 92.5 fL (ref 78.0–100.0)
MONOS PCT: 14 % — AB (ref 3–12)
MPV: 10.3 fL (ref 9.4–12.4)
Monocytes Absolute: 0.4 10*3/uL (ref 0.1–1.0)
Neutro Abs: 1.4 10*3/uL — ABNORMAL LOW (ref 1.7–7.7)
Neutrophils Relative %: 53 % (ref 43–77)
Platelets: 106 10*3/uL — ABNORMAL LOW (ref 150–400)
RBC: 3.74 MIL/uL — ABNORMAL LOW (ref 3.87–5.11)
RDW: 15.2 % (ref 11.5–15.5)
WBC: 2.7 10*3/uL — ABNORMAL LOW (ref 4.0–10.5)

## 2014-05-03 LAB — RPR

## 2014-05-04 LAB — HEPATITIS C RNA QUANTITATIVE: HCV Quantitative: NOT DETECTED IU/mL (ref <15)

## 2014-05-04 LAB — HIV-1 RNA QUANT-NO REFLEX-BLD

## 2014-05-04 LAB — T-HELPER CELL (CD4) - (RCID CLINIC ONLY)
CD4 T CELL HELPER: 45 % (ref 33–55)
CD4 T Cell Abs: 300 /uL — ABNORMAL LOW (ref 400–2700)

## 2014-05-09 ENCOUNTER — Ambulatory Visit (INDEPENDENT_AMBULATORY_CARE_PROVIDER_SITE_OTHER): Payer: Self-pay | Admitting: Infectious Disease

## 2014-05-09 ENCOUNTER — Encounter: Payer: Self-pay | Admitting: Infectious Disease

## 2014-05-09 VITALS — BP 137/82 | HR 81 | Temp 98.4°F | Wt 159.0 lb

## 2014-05-09 DIAGNOSIS — B182 Chronic viral hepatitis C: Secondary | ICD-10-CM

## 2014-05-09 DIAGNOSIS — B20 Human immunodeficiency virus [HIV] disease: Secondary | ICD-10-CM

## 2014-05-09 NOTE — Progress Notes (Signed)
  Subjective:    Patient ID: Barbara Henry, female    DOB: 12-19-1960, 53 y.o.   MRN: [id number]  HPI  53 year old female who had been doing superbly well on her antiviral regimen, prezista, norvir and truvada with undetectable viral load and health cd4 count. [facility] her being treated for Hep C without coma with HARVONI and she had undetectable viral load and healthy CD4 count.   Lab Results  Component Value Date   HIV1RNAQUANT <20 05/03/2014   Lab Results  Component Value Date   CD4TABS 300* 05/03/2014   CD4TABS 310* 11/15/2013   CD4TABS 430 08/31/2013    Her Hepatitis C has been cured with SVR >12 weeks with undetectable viral load.   Review of Systems  Constitutional: Negative for fever, chills, diaphoresis, activity change, appetite change, fatigue and unexpected weight change.  HENT: Negative for congestion, sneezing and trouble swallowing.   Eyes: Negative for photophobia and visual disturbance.  Respiratory: Negative for chest tightness, shortness of breath, wheezing and stridor.   Cardiovascular: Negative for chest pain, palpitations and leg swelling.  Gastrointestinal: Negative for nausea, constipation, blood in stool, abdominal distention and anal bleeding.  Genitourinary: Negative for dysuria, hematuria and difficulty urinating.  Musculoskeletal: Negative for myalgias, back pain, joint swelling, arthralgias and gait problem.  Skin: Negative for color change, pallor and wound.  Neurological: Negative for dizziness, tremors, weakness and light-headedness.  Hematological: Negative for adenopathy. Does not bruise/bleed easily.  Psychiatric/Behavioral: Negative for behavioral problems, confusion, sleep disturbance, dysphoric mood, decreased concentration and agitation.       Objective:   Physical Exam  Constitutional: She is oriented to person, place, and time. She appears well-developed and well-nourished. No distress.  HENT:   Head: Normocephalic and atraumatic.  Mouth/Throat: Oropharynx is clear and moist. No oropharyngeal exudate.  Eyes: Conjunctivae and EOM are normal. No scleral icterus.  Neck: Normal range of motion. Neck supple.  Cardiovascular: Normal rate and regular rhythm.   Pulmonary/Chest: Effort normal. No respiratory distress. She has no wheezes.  Abdominal: She exhibits no distension. There is no tenderness.  Musculoskeletal: She exhibits no edema or tenderness.  Neurological: She is alert and oriented to person, place, and time. She exhibits normal muscle tone. Coordination normal.  Skin: Skin is warm and dry. She is not diaphoretic. No erythema. No pallor.  Psychiatric: She has a normal mood and affect. Her behavior is normal. Judgment and thought content normal.      Assessment & Plan:    HIV: continue TRIUMEQ, recheck labs in 3-4 months. I spent greater than 25 minutes with the patient including greater than 50% of time in face to face counsel of the patient and in coordination of their care.    Hep C:  On HARVONI. CURED!!!  CV risk: see if she could enroll in REPRIEVE she is very much interested. Hep C has been cured. FRS is 1%

## 2014-05-23 ENCOUNTER — Ambulatory Visit (INDEPENDENT_AMBULATORY_CARE_PROVIDER_SITE_OTHER): Payer: Self-pay | Admitting: *Deleted

## 2014-05-23 VITALS — Wt 160.0 lb

## 2014-05-23 DIAGNOSIS — B20 Human immunodeficiency virus [HIV] disease: Secondary | ICD-10-CM

## 2014-05-23 DIAGNOSIS — Z006 Encounter for examination for normal comparison and control in clinical research program: Secondary | ICD-10-CM

## 2014-05-23 LAB — COMPREHENSIVE METABOLIC PANEL
ALT: 24 U/L (ref 0–35)
AST: 33 U/L (ref 0–37)
Albumin: 4.7 g/dL (ref 3.5–5.2)
Alkaline Phosphatase: 123 U/L — ABNORMAL HIGH (ref 39–117)
BUN: 6 mg/dL (ref 6–23)
CALCIUM: 9.5 mg/dL (ref 8.4–10.5)
CHLORIDE: 99 meq/L (ref 96–112)
CO2: 24 mEq/L (ref 19–32)
CREATININE: 0.83 mg/dL (ref 0.50–1.10)
Glucose, Bld: 81 mg/dL (ref 70–99)
Potassium: 4 mEq/L (ref 3.5–5.3)
Sodium: 139 mEq/L (ref 135–145)
TOTAL PROTEIN: 8.5 g/dL — AB (ref 6.0–8.3)
Total Bilirubin: 0.5 mg/dL (ref 0.2–1.2)

## 2014-05-23 LAB — LIPID PANEL
CHOL/HDL RATIO: 2.2 ratio
CHOLESTEROL: 200 mg/dL (ref 0–200)
HDL: 92 mg/dL (ref >39)
LDL Cholesterol: 87 mg/dL (ref 0–99)
Triglycerides: 104 mg/dL (ref <150)
VLDL: 21 mg/dL (ref 0–40)

## 2014-05-23 NOTE — Progress Notes (Signed)
Barbara BallRobin returned today to do fasting labs for the reprieve study. She says she hasn't had a period in 4 years but checking [facility] since documentation in chart says different. She said she ate last at 5pm yesterday. Once she is determined to be eligible I will schedule her for entry.

## 2014-05-24 LAB — HCG, SERUM, QUALITATIVE: Preg, Serum: NEGATIVE

## 2014-05-24 LAB — FOLLICLE STIMULATING HORMONE: FSH: 57 m[IU]/mL

## 2014-06-08 ENCOUNTER — Ambulatory Visit (INDEPENDENT_AMBULATORY_CARE_PROVIDER_SITE_OTHER): Payer: Self-pay | Admitting: *Deleted

## 2014-06-08 ENCOUNTER — Ambulatory Visit: Payer: Medicaid Other

## 2014-06-08 VITALS — BP 121/85 | HR 78 | Temp 97.6°F | Resp 18 | Wt 160.5 lb

## 2014-06-08 DIAGNOSIS — B20 Human immunodeficiency virus [HIV] disease: Secondary | ICD-10-CM

## 2014-06-08 DIAGNOSIS — Z006 Encounter for examination for normal comparison and control in clinical research program: Secondary | ICD-10-CM

## 2014-06-08 MED ORDER — PITAVASTATIN CALCIUM 4 MG PO TABS: 1.0000 | ORAL_TABLET | Freq: Every day | ORAL | Status: DC

## 2014-06-08 NOTE — Progress Notes (Signed)
Barbara Henry enrolled on the Reprieve study today. She was randomized to pitavastatin 4mg /placebo daily. She was instructed on watching for side effects such as rash, swelling, muscle aches. She understands to call if she develops any. She was also instructed to let us know of any new medications she may take even OTC meds. She currently denies any problems and will return in 1 month for followup.

## 2014-06-09 ENCOUNTER — Other Ambulatory Visit: Payer: Self-pay | Admitting: *Deleted

## 2014-06-09 DIAGNOSIS — B20 Human immunodeficiency virus [HIV] disease: Secondary | ICD-10-CM

## 2014-06-09 MED ORDER — ABACAVIR-DOLUTEGRAVIR-LAMIVUD 600-50-300 MG PO TABS: 1.0000 | ORAL_TABLET | Freq: Every day | ORAL | Status: DC

## 2014-06-28 ENCOUNTER — Ambulatory Visit: Payer: Medicaid Other

## 2014-07-08 ENCOUNTER — Ambulatory Visit (INDEPENDENT_AMBULATORY_CARE_PROVIDER_SITE_OTHER): Payer: Self-pay | Admitting: *Deleted

## 2014-07-08 VITALS — BP 116/82 | HR 83 | Temp 98.2°F | Resp 16 | Wt 166.4 lb

## 2014-07-08 DIAGNOSIS — Z006 Encounter for examination for normal comparison and control in clinical research program: Secondary | ICD-10-CM

## 2014-07-08 LAB — COMPREHENSIVE METABOLIC PANEL
ALBUMIN: 4.4 g/dL (ref 3.5–5.2)
ALT: 28 U/L (ref 0–35)
AST: 41 U/L — ABNORMAL HIGH (ref 0–37)
Alkaline Phosphatase: 85 U/L (ref 39–117)
BUN: 12 mg/dL (ref 6–23)
CO2: 23 mEq/L (ref 19–32)
Calcium: 9.4 mg/dL (ref 8.4–10.5)
Chloride: 102 mEq/L (ref 96–112)
Creat: 1.18 mg/dL — ABNORMAL HIGH (ref 0.50–1.10)
Glucose, Bld: 63 mg/dL — ABNORMAL LOW (ref 70–99)
POTASSIUM: 3.9 meq/L (ref 3.5–5.3)
Sodium: 137 mEq/L (ref 135–145)
TOTAL PROTEIN: 7.8 g/dL (ref 6.0–8.3)
Total Bilirubin: 0.3 mg/dL (ref 0.2–1.2)

## 2014-07-08 NOTE — Progress Notes (Signed)
Participant returned for month 1 for the Reprieve study.  No report of adverse effects since starting her Pitavastatin/placebo.   C/o of sinus pressure, cough, white/clear nasal drainage, onset 2 weeks ago.  Had CMET done per protocol.

## 2014-07-21 ENCOUNTER — Encounter: Payer: Self-pay | Admitting: Infectious Disease

## 2014-10-07 ENCOUNTER — Encounter (INDEPENDENT_AMBULATORY_CARE_PROVIDER_SITE_OTHER): Payer: Self-pay | Admitting: *Deleted

## 2014-10-07 VITALS — BP 133/84 | HR 83 | Temp 98.1°F | Resp 16 | Wt 159.2 lb

## 2014-10-07 DIAGNOSIS — Z006 Encounter for examination for normal comparison and control in clinical research program: Secondary | ICD-10-CM

## 2014-10-07 NOTE — Progress Notes (Signed)
Barbara Henry is here for [id number], month 4. She denies any new medication or symptoms. Pill count was performed and study medication was dispensed. She received $5- gift card and 2 bus passes for visit. Next appointment scheduled for Friday, Sept. 2, 2016 at 10:15am. Barbara HeapElisha Epperson RN

## 2014-10-19 ENCOUNTER — Other Ambulatory Visit: Payer: Self-pay | Admitting: Infectious Disease

## 2015-01-09 ENCOUNTER — Other Ambulatory Visit: Payer: Self-pay

## 2015-01-09 ENCOUNTER — Ambulatory Visit: Payer: Self-pay

## 2015-01-09 DIAGNOSIS — B20 Human immunodeficiency virus [HIV] disease: Secondary | ICD-10-CM

## 2015-01-09 DIAGNOSIS — B1710 Acute hepatitis C without hepatic coma: Secondary | ICD-10-CM

## 2015-01-09 LAB — COMPLETE METABOLIC PANEL WITH GFR
ALT: 26 U/L (ref 6–29)
AST: 38 U/L — ABNORMAL HIGH (ref 10–35)
Albumin: 4.4 g/dL (ref 3.6–5.1)
Alkaline Phosphatase: 94 U/L (ref 33–130)
BUN: 8 mg/dL (ref 7–25)
CALCIUM: 9.4 mg/dL (ref 8.6–10.4)
CO2: 21 mmol/L (ref 20–31)
CREATININE: 1 mg/dL (ref 0.50–1.05)
Chloride: 103 mmol/L (ref 98–110)
GFR, EST AFRICAN AMERICAN: 75 mL/min (ref >=60)
GFR, Est Non African American: 65 mL/min (ref >=60)
Glucose, Bld: 79 mg/dL (ref 65–99)
Potassium: 4 mmol/L (ref 3.5–5.3)
Sodium: 138 mmol/L (ref 135–146)
Total Bilirubin: 0.3 mg/dL (ref 0.2–1.2)
Total Protein: 8 g/dL (ref 6.1–8.1)

## 2015-01-09 LAB — RPR

## 2015-01-09 NOTE — Addendum Note (Signed)
Addended by: Mariea Clonts D on: 01/09/2015 12:07 PM   Modules accepted: Orders

## 2015-01-10 LAB — T-HELPER CELL (CD4) - (RCID CLINIC ONLY)
CD4 T CELL HELPER: 51 % (ref 33–55)
CD4 T Cell Abs: 370 /uL — ABNORMAL LOW (ref 400–2700)

## 2015-01-11 LAB — HIV-1 RNA QUANT-NO REFLEX-BLD
HIV 1 RNA Quant: <20 copies/mL (ref <20)
HIV-1 RNA Quant, Log: <1.3 {Log} (ref <1.30)

## 2015-02-01 ENCOUNTER — Encounter: Payer: Self-pay | Admitting: Infectious Disease

## 2015-02-01 ENCOUNTER — Ambulatory Visit (INDEPENDENT_AMBULATORY_CARE_PROVIDER_SITE_OTHER): Payer: Self-pay | Admitting: Infectious Disease

## 2015-02-01 VITALS — BP 152/97 | HR 103 | Temp 98.3°F | Wt 148.0 lb

## 2015-02-01 DIAGNOSIS — R030 Elevated blood-pressure reading, without diagnosis of hypertension: Secondary | ICD-10-CM

## 2015-02-01 DIAGNOSIS — B20 Human immunodeficiency virus [HIV] disease: Secondary | ICD-10-CM

## 2015-02-01 DIAGNOSIS — Z23 Encounter for immunization: Secondary | ICD-10-CM

## 2015-02-01 DIAGNOSIS — IMO0001 Reserved for inherently not codable concepts without codable children: Secondary | ICD-10-CM

## 2015-02-01 DIAGNOSIS — Z8619 Personal history of other infectious and parasitic diseases: Secondary | ICD-10-CM

## 2015-02-01 HISTORY — DX: Reserved for inherently not codable concepts without codable children: IMO0001

## 2015-02-01 HISTORY — DX: Personal history of other infectious and parasitic diseases: Z86.19

## 2015-02-01 NOTE — Progress Notes (Signed)
  Subjective:    Patient ID: Barbara Henry, female    DOB: 11/05/1960, 54 y.o.   MRN: [id number]  HPI   54 year old female who is doing quite well on her current regimen TRIUMEQ with undetectable R load and healthy CD4 count. She has cured her hepatitis C with HARVONI.  She is doing well although blood pressure is elevated today--likely due to whitecoat hypertension  Lab Results  Component Value Date   HIV1RNAQUANT <20 01/09/2015   Lab Results  Component Value Date   CD4TABS 370* 01/09/2015   CD4TABS 300* 05/03/2014   CD4TABS 310* 11/15/2013     Review of Systems  Constitutional: Negative for fever, chills, diaphoresis, activity change, appetite change, fatigue and unexpected weight change.  HENT: Negative for congestion, sneezing and trouble swallowing.   Eyes: Negative for photophobia and visual disturbance.  Respiratory: Negative for chest tightness, shortness of breath, wheezing and stridor.   Cardiovascular: Negative for chest pain, palpitations and leg swelling.  Gastrointestinal: Negative for nausea, constipation, blood in stool, abdominal distention and anal bleeding.  Genitourinary: Negative for dysuria and hematuria.  Musculoskeletal: Negative for myalgias, back pain, joint swelling, arthralgias and gait problem.  Skin: Negative for color change, pallor and wound.  Neurological: Negative for dizziness, tremors, weakness and light-headedness.  Hematological: Negative for adenopathy. Does not bruise/bleed easily.  Psychiatric/Behavioral: Negative for behavioral problems, confusion, sleep disturbance, dysphoric mood, decreased concentration and agitation.       Objective:   Physical Exam  Constitutional: She is oriented to person, place, and time. She appears well-developed and well-nourished. No distress.  HENT:  Head: Normocephalic and atraumatic.  Mouth/Throat: Oropharynx is clear and moist. No oropharyngeal exudate.  Eyes: Conjunctivae and EOM are normal. No  scleral icterus.  Neck: Normal range of motion. Neck supple.  Cardiovascular: Normal rate and regular rhythm.   Pulmonary/Chest: Effort normal. No respiratory distress. She has no wheezes.  Abdominal: She exhibits no distension. There is no tenderness.  Musculoskeletal: She exhibits no edema or tenderness.  Neurological: She is alert and oriented to person, place, and time. She exhibits normal muscle tone. Coordination normal.  Skin: Skin is warm and dry. No rash noted. She is not diaphoretic. No erythema.  Psychiatric: She has a normal mood and affect. Her behavior is normal. Judgment and thought content normal.      Assessment & Plan:    HIV: continue TRIUMEQ, r   HX OF Hep C:  SP  HARVONI. CURED!!!  White coat HTN: asked her to keep bp log  I spent greater than 25 minutes with the patient including greater than 50% of time in face to face counsel of the patient re her HIV, white coat HTN and her cured HCV  and in coordination of their care.

## 2015-02-03 ENCOUNTER — Ambulatory Visit (INDEPENDENT_AMBULATORY_CARE_PROVIDER_SITE_OTHER): Payer: Self-pay | Admitting: *Deleted

## 2015-02-03 ENCOUNTER — Other Ambulatory Visit (HOSPITAL_COMMUNITY)
Admission: RE | Admit: 2015-02-03 | Discharge: 2015-02-03 | Disposition: A | Discharge status: Home / Self Care | Payer: Medicaid Other | Source: Ambulatory Visit | Attending: Infectious Disease | Admitting: Infectious Disease

## 2015-02-03 ENCOUNTER — Encounter: Payer: Medicaid Other | Admitting: *Deleted

## 2015-02-03 VITALS — BP 113/77 | HR 85 | Temp 97.7°F | Resp 16 | Wt 150.0 lb

## 2015-02-03 DIAGNOSIS — Z113 Encounter for screening for infections with a predominantly sexual mode of transmission: Secondary | ICD-10-CM

## 2015-02-03 DIAGNOSIS — R87620 Atypical squamous cells of undetermined significance on cytologic smear of vagina (ASC-US): Secondary | ICD-10-CM

## 2015-02-03 DIAGNOSIS — Z006 Encounter for examination for normal comparison and control in clinical research program: Secondary | ICD-10-CM

## 2015-02-03 DIAGNOSIS — Z01411 Encounter for gynecological examination (general) (routine) with abnormal findings: Secondary | ICD-10-CM | POA: Insufficient documentation

## 2015-02-03 DIAGNOSIS — N760 Acute vaginitis: Secondary | ICD-10-CM | POA: Insufficient documentation

## 2015-02-03 DIAGNOSIS — Z1151 Encounter for screening for human papillomavirus (HPV): Secondary | ICD-10-CM | POA: Insufficient documentation

## 2015-02-03 DIAGNOSIS — Z1231 Encounter for screening mammogram for malignant neoplasm of breast: Secondary | ICD-10-CM

## 2015-02-03 NOTE — Progress Notes (Addendum)
vagnial discharge x 2 weeks, itching, smell.  Will sample vaginal discharge for lab. Pt last sexual encounter was last week.  It was unprotected sex.  Pt advised to always use protection for herself and her partner.  Pt given educational materials re: HIV and women, self-esteem, BSE, nutrition and diet management, PAP smears and partner safety. Pt given condoms Referral to [facility] per Dr. Daiva Eves. Spoke with [facility].  Due to the ASCUS not being "high-risk" [facility] will not need to see the pt.

## 2015-02-03 NOTE — Patient Instructions (Signed)
Your results will be ready next week.  You will receive a call if treatment is needed.  Thank you for coming to the Center for your care.  Angelique Blonder, RN

## 2015-02-03 NOTE — Progress Notes (Signed)
Barbara Henry is here for her 8 month study visit for Reprieve, A Randomized Trial to Prevent Vascular Events in HIV (study drug is Pitavastatin  or placebo). She says she has been very adherent with study meds and her pill counts verify that. She denies any muscle aches or weakness, but says she is having some clear vaginal discharge and vaginal itching which is bothersome. She will also see Angelique Blonder later. She will return in January for the next study visit.

## 2015-02-09 NOTE — Addendum Note (Signed)
Addended by: Jennet Maduro D on: 02/09/2015 12:39 PM   Modules accepted: Orders

## 2015-02-17 ENCOUNTER — Other Ambulatory Visit: Payer: Self-pay | Admitting: Infectious Disease

## 2015-02-17 DIAGNOSIS — B20 Human immunodeficiency virus [HIV] disease: Secondary | ICD-10-CM

## 2015-06-29 ENCOUNTER — Encounter (INDEPENDENT_AMBULATORY_CARE_PROVIDER_SITE_OTHER): Payer: Self-pay | Admitting: *Deleted

## 2015-06-29 VITALS — BP 123/84 | HR 92 | Temp 98.4°F | Resp 16 | Wt 144.0 lb

## 2015-06-29 DIAGNOSIS — Z21 Asymptomatic human immunodeficiency virus [HIV] infection status: Secondary | ICD-10-CM

## 2015-06-29 DIAGNOSIS — Z006 Encounter for examination for normal comparison and control in clinical research program: Secondary | ICD-10-CM

## 2015-06-29 LAB — COMPREHENSIVE METABOLIC PANEL
ALK PHOS: 75 U/L (ref 33–130)
ALT: 97 U/L — AB (ref 6–29)
AST: 217 U/L — AB (ref 10–35)
Albumin: 4.6 g/dL (ref 3.6–5.1)
BILIRUBIN TOTAL: 1.3 mg/dL — AB (ref 0.2–1.2)
BUN: 8 mg/dL (ref 7–25)
CO2: 22 mmol/L (ref 20–31)
CREATININE: 0.95 mg/dL (ref 0.50–1.05)
Calcium: 10.1 mg/dL (ref 8.6–10.4)
Chloride: 95 mmol/L — ABNORMAL LOW (ref 98–110)
GLUCOSE: 79 mg/dL (ref 65–99)
POTASSIUM: 3.7 mmol/L (ref 3.5–5.3)
SODIUM: 133 mmol/L — AB (ref 135–146)
TOTAL PROTEIN: 8.4 g/dL — AB (ref 6.1–8.1)

## 2015-06-29 LAB — CBC WITH DIFFERENTIAL/PLATELET
Basophils Absolute: 0 10*3/uL (ref 0.0–0.1)
Basophils Relative: 0 % (ref 0–1)
EOS PCT: 1 % (ref 0–5)
Eosinophils Absolute: 0 10*3/uL (ref 0.0–0.7)
HEMATOCRIT: 37.5 % (ref 36.0–46.0)
HEMOGLOBIN: 12.6 g/dL (ref 12.0–15.0)
LYMPHS ABS: 0.9 10*3/uL (ref 0.7–4.0)
LYMPHS PCT: 20 % (ref 12–46)
MCH: 33 pg (ref 26.0–34.0)
MCHC: 33.6 g/dL (ref 30.0–36.0)
MCV: 98.2 fL (ref 78.0–100.0)
MONOS PCT: 17 % — AB (ref 3–12)
MPV: 10.2 fL (ref 8.6–12.4)
Monocytes Absolute: 0.8 10*3/uL (ref 0.1–1.0)
Neutro Abs: 2.8 10*3/uL (ref 1.7–7.7)
Neutrophils Relative %: 62 % (ref 43–77)
Platelets: 135 10*3/uL — ABNORMAL LOW (ref 150–400)
RBC: 3.82 MIL/uL — AB (ref 3.87–5.11)
RDW: 13.6 % (ref 11.5–15.5)
WBC: 4.5 10*3/uL (ref 4.0–10.5)

## 2015-06-29 LAB — LIPID PANEL
Cholesterol: 198 mg/dL (ref 125–200)
HDL: 86 mg/dL (ref >=46)
LDL CALC: 87 mg/dL (ref <130)
TRIGLYCERIDES: 126 mg/dL (ref <150)
Total CHOL/HDL Ratio: 2.3 Ratio (ref <=5.0)
VLDL: 25 mg/dL (ref <30)

## 2015-06-29 NOTE — Progress Notes (Signed)
Barbara Henry is here for her 12 month visit for  Reprieve, A Randomized Trial to Prevent Vascular Events in HIV (study drug is Pitavastatin  or placebo). She denies any new problems or concerns. She returned 1 pill bottle with 7 tablets and should have had about 70. She thinks she may have left one bottle behind at home. She doesn't recall missing any doses and takes her HIV meds every day. We went ahead and got her [facility] labs and she scheduled an appt with Dr. Daiva Eves for her regular followup. She also saw LyDonia today to get her Juanell Fairly renewed. She will return in May for the next Reprieve visit.

## 2015-06-29 NOTE — Addendum Note (Signed)
Addended by: Mariea Clonts D on: 06/29/2015 04:00 PM   Modules accepted: Orders

## 2015-06-30 LAB — T-HELPER CELL (CD4) - (RCID CLINIC ONLY)
CD4 T CELL ABS: 320 /uL — AB (ref 400–2700)
CD4 T CELL HELPER: 36 % (ref 33–55)

## 2015-06-30 LAB — HIV-1 RNA QUANT-NO REFLEX-BLD

## 2015-07-10 ENCOUNTER — Ambulatory Visit: Payer: Medicaid Other | Admitting: Infectious Disease

## 2015-07-26 ENCOUNTER — Ambulatory Visit: Payer: Medicaid Other

## 2015-08-10 ENCOUNTER — Telehealth: Payer: Self-pay | Admitting: *Deleted

## 2015-08-10 ENCOUNTER — Other Ambulatory Visit: Payer: Self-pay | Admitting: *Deleted

## 2015-08-10 DIAGNOSIS — B20 Human immunodeficiency virus [HIV] disease: Secondary | ICD-10-CM

## 2015-08-10 MED ORDER — ABACAVIR-DOLUTEGRAVIR-LAMIVUD 600-50-300 MG PO TABS: 1.0000 | ORAL_TABLET | Freq: Every day | ORAL | Status: DC

## 2015-08-10 NOTE — Telephone Encounter (Signed)
Patient called stating she has been off her Hiv medication x 1 month, she is not sure why she has not received it. Spoke to Black & DeckerMinh Pham, pharmacist and advised her that it has been sent to her mail order, [facility]. She was also given the # to call to arrange ASAP shipment. Scheduled her a follow up appt with Dr. Daiva EvesVan Dam for 09/19/15. Wendall MolaJacqueline Cockerham

## 2015-09-19 ENCOUNTER — Ambulatory Visit (INDEPENDENT_AMBULATORY_CARE_PROVIDER_SITE_OTHER): Payer: Self-pay | Admitting: Infectious Disease

## 2015-09-19 ENCOUNTER — Other Ambulatory Visit (HOSPITAL_COMMUNITY)
Admission: RE | Admit: 2015-09-19 | Discharge: 2015-09-19 | Disposition: A | Discharge status: Home / Self Care | Payer: Medicaid Other | Source: Ambulatory Visit | Attending: Infectious Disease | Admitting: Infectious Disease

## 2015-09-19 ENCOUNTER — Encounter: Payer: Self-pay | Admitting: Infectious Disease

## 2015-09-19 VITALS — BP 117/82 | HR 90 | Temp 98.0°F | Wt 153.0 lb

## 2015-09-19 DIAGNOSIS — Z113 Encounter for screening for infections with a predominantly sexual mode of transmission: Secondary | ICD-10-CM | POA: Insufficient documentation

## 2015-09-19 DIAGNOSIS — Z8619 Personal history of other infectious and parasitic diseases: Secondary | ICD-10-CM

## 2015-09-19 DIAGNOSIS — R21 Rash and other nonspecific skin eruption: Secondary | ICD-10-CM

## 2015-09-19 DIAGNOSIS — IMO0001 Reserved for inherently not codable concepts without codable children: Secondary | ICD-10-CM

## 2015-09-19 DIAGNOSIS — Z79899 Other long term (current) drug therapy: Secondary | ICD-10-CM

## 2015-09-19 DIAGNOSIS — R030 Elevated blood-pressure reading, without diagnosis of hypertension: Secondary | ICD-10-CM

## 2015-09-19 DIAGNOSIS — B20 Human immunodeficiency virus [HIV] disease: Secondary | ICD-10-CM

## 2015-09-19 DIAGNOSIS — J309 Allergic rhinitis, unspecified: Secondary | ICD-10-CM

## 2015-09-19 LAB — CBC WITH DIFFERENTIAL/PLATELET
BASOS ABS: 0 {cells}/uL (ref 0–200)
Basophils Relative: 0 %
EOS ABS: 82 {cells}/uL (ref 15–500)
EOS PCT: 2 %
HCT: 36.5 % (ref 35.0–45.0)
Hemoglobin: 12 g/dL (ref 11.7–15.5)
Lymphocytes Relative: 50 %
Lymphs Abs: 2050 cells/uL (ref 850–3900)
MCH: 32.8 pg (ref 27.0–33.0)
MCHC: 32.9 g/dL (ref 32.0–36.0)
MCV: 99.7 fL (ref 80.0–100.0)
MONOS PCT: 13 %
MPV: 10.7 fL (ref 7.5–12.5)
Monocytes Absolute: 533 cells/uL (ref 200–950)
NEUTROS PCT: 35 %
Neutro Abs: 1435 cells/uL — ABNORMAL LOW (ref 1500–7800)
PLATELETS: 160 10*3/uL (ref 140–400)
RBC: 3.66 MIL/uL — ABNORMAL LOW (ref 3.80–5.10)
RDW: 14.5 % (ref 11.0–15.0)
WBC: 4.1 10*3/uL (ref 3.8–10.8)

## 2015-09-19 LAB — COMPLETE METABOLIC PANEL WITH GFR
ALT: 24 U/L (ref 6–29)
AST: 43 U/L — AB (ref 10–35)
Albumin: 4.1 g/dL (ref 3.6–5.1)
Alkaline Phosphatase: 83 U/L (ref 33–130)
BUN: 12 mg/dL (ref 7–25)
CO2: 24 mmol/L (ref 20–31)
Calcium: 9.3 mg/dL (ref 8.6–10.4)
Chloride: 103 mmol/L (ref 98–110)
Creat: 0.78 mg/dL (ref 0.50–1.05)
GFR, Est African American: >89 mL/min (ref >=60)
GFR, Est Non African American: 87 mL/min (ref >=60)
GLUCOSE: 64 mg/dL — AB (ref 65–99)
POTASSIUM: 3.8 mmol/L (ref 3.5–5.3)
Sodium: 140 mmol/L (ref 135–146)
Total Bilirubin: 0.3 mg/dL (ref 0.2–1.2)
Total Protein: 7.6 g/dL (ref 6.1–8.1)

## 2015-09-19 LAB — LIPID PANEL
CHOLESTEROL: 183 mg/dL (ref 125–200)
HDL: 80 mg/dL (ref >=46)
LDL Cholesterol: 74 mg/dL (ref <130)
Total CHOL/HDL Ratio: 2.3 Ratio (ref <=5.0)
Triglycerides: 144 mg/dL (ref <150)
VLDL: 29 mg/dL (ref <30)

## 2015-09-19 MED ORDER — FLUTICASONE PROPIONATE 50 MCG/ACT NA SUSP: 2.0000 | Freq: Every day | NASAL | Status: DC

## 2015-09-19 MED ORDER — TRIAMCINOLONE ACETONIDE 0.5 % EX OINT: 1.0000 "application " | TOPICAL_OINTMENT | Freq: Two times a day (BID) | CUTANEOUS | Status: DC

## 2015-09-19 NOTE — Progress Notes (Signed)
Subjective:   Chief complaint: followup for HIV,  seasonal allergies with sinus congestion and rash on chest   Patient ID: Barbara Henry, female    DOB: 09-26-1960, 55 y.o.   MRN: [id number]  HPI   55 year old female who is doing quite well on her current regimen TRIUMEQ with undetectable R load and healthy CD4 count. She has cured her hepatitis C with HARVONI.  She is c/o rash on her chest and also some sinus congestion that she believes are due to seasonal allergies. She is only taking aspirin for the allergies.  Lab Results  Component Value Date   HIV1RNAQUANT <20 06/29/2015   HIV1RNAQUANT <20 01/09/2015   HIV1RNAQUANT <20 05/03/2014     Lab Results  Component Value Date   CD4TABS 320* 06/29/2015   CD4TABS 370* 01/09/2015   CD4TABS 300* 05/03/2014   Past Medical History  Diagnosis Date  . HIV (human immunodeficiency virus infection) (HCC)   . Shingles   . Anemia   . Hepatitis C without hepatic coma   . HTN, white coat 02/01/2015  . History of hepatitis C 02/01/2015    No past surgical history on file.  No family history on file.    Social History   Social History  . Marital Status: Single    Spouse Name: N/A  . Number of Children: N/A  . Years of Education: N/A   Social History Main Topics  . Smoking status: Never Smoker   . Smokeless tobacco: Never Used  . Alcohol Use: 1.0 oz/week    2 drink(s) per week     Comment: beer  . Drug Use: No  . Sexual Activity:    Partners: Male    Birth Control/ Protection: Condom     Comment: declined condoms, last sexual encounter 03/2013   Other Topics Concern  . None   Social History Narrative    No Known Allergies   Current outpatient prescriptions:  .  abacavir-dolutegravir-lamiVUDine (TRIUMEQ) 600-50-300 MG tablet, Take 1 tablet by mouth daily., Disp: 30 tablet, Rfl: 5 .  aspirin 325 MG tablet, Take 325 mg by mouth once.  , Disp: , Rfl:  .  fluticasone (FLONASE) 50 MCG/ACT nasal spray, Place 2 sprays  into both nostrils daily., Disp: 15.8 g, Rfl: 3 .  Pitavastatin Calcium 4 MG TABS, Take 1 tablet (4 mg total) by mouth daily., Disp: 30 tablet, Rfl: 11 .  triamcinolone ointment (KENALOG) 0.5 %, Apply 1 application topically 2 (two) times daily., Disp: 30 g, Rfl: 2   Review of Systems  Constitutional: Negative for fever, chills, diaphoresis, activity change, appetite change, fatigue and unexpected weight change.  HENT: Positive for postnasal drip, rhinorrhea, sinus pressure and sneezing. Negative for congestion and trouble swallowing.   Eyes: Negative for photophobia and visual disturbance.  Respiratory: Negative for chest tightness, shortness of breath, wheezing and stridor.   Cardiovascular: Negative for chest pain, palpitations and leg swelling.  Gastrointestinal: Negative for nausea, constipation, blood in stool, abdominal distention and anal bleeding.  Genitourinary: Negative for dysuria and hematuria.  Musculoskeletal: Negative for myalgias, back pain, joint swelling, arthralgias and gait problem.  Skin: Positive for rash. Negative for color change, pallor and wound.  Neurological: Negative for dizziness, tremors, weakness and light-headedness.  Hematological: Negative for adenopathy. Does not bruise/bleed easily.  Psychiatric/Behavioral: Negative for behavioral problems, confusion, sleep disturbance, dysphoric mood, decreased concentration and agitation.       Objective:   Physical Exam  Constitutional: She is oriented to person,  place, and time. She appears well-developed and well-nourished. No distress.  HENT:  Head: Normocephalic and atraumatic.  Mouth/Throat: Oropharynx is clear and moist. No oropharyngeal exudate.  Eyes: Conjunctivae and EOM are normal. No scleral icterus.  Neck: Normal range of motion. Neck supple.  Cardiovascular: Normal rate and regular rhythm.   Pulmonary/Chest: Effort normal. No respiratory distress. She has no wheezes.  Abdominal: She exhibits no  distension. There is no tenderness.  Musculoskeletal: She exhibits no edema or tenderness.  Neurological: She is alert and oriented to person, place, and time. She exhibits normal muscle tone. Coordination normal.  Skin: Skin is warm and dry. Rash noted. She is not diaphoretic. No erythema.  Psychiatric: She has a normal mood and affect. Her behavior is normal. Judgment and thought content normal.   Macular rash on neck     Assessment & Plan:    HIV: continue TRIUMEQ, check labs today  Rash on neck: try topical tramcinolone  Allergic rhinitis: try zyrtec daily and flonase 2 sprays each nostril q pm during this peak of her symptoms  HX OF Hep C:  SP  HARVONI. CURED  White coat HTN: normotensive today  I spent greater than 25 minutes with the patient including greater than 50% of time in face to face counsel of the patient re her HIV, white coat HTN her cured HCV, her seasonal allergies, her rash  and in coordination of her care.

## 2015-09-20 LAB — T-HELPER CELL (CD4) - (RCID CLINIC ONLY)
CD4 T CELL ABS: 750 /uL (ref 400–2700)
CD4 T CELL HELPER: 36 % (ref 33–55)

## 2015-09-20 LAB — RPR

## 2015-09-20 LAB — URINE CYTOLOGY ANCILLARY ONLY
CHLAMYDIA, DNA PROBE: NEGATIVE
NEISSERIA GONORRHEA: NEGATIVE

## 2015-09-22 LAB — HIV RNA, RTPCR W/R GT (RTI, PI,INT)
HIV 1 RNA Quant: 39 copies/mL — ABNORMAL HIGH
HIV-1 RNA QUANT, LOG: 1.59 {Log_copies}/mL — AB

## 2015-10-06 ENCOUNTER — Encounter (INDEPENDENT_AMBULATORY_CARE_PROVIDER_SITE_OTHER): Payer: Self-pay | Admitting: *Deleted

## 2015-10-06 VITALS — BP 115/78 | HR 87 | Temp 98.4°F | Resp 16 | Wt 155.5 lb

## 2015-10-06 DIAGNOSIS — R21 Rash and other nonspecific skin eruption: Secondary | ICD-10-CM

## 2015-10-06 DIAGNOSIS — Z006 Encounter for examination for normal comparison and control in clinical research program: Secondary | ICD-10-CM

## 2015-10-06 MED ORDER — TRIAMCINOLONE ACETONIDE 0.5 % EX OINT
1.0000 | TOPICAL_OINTMENT | Freq: Two times a day (BID) | CUTANEOUS | Status: DC
Start: 2015-10-06 — End: 2017-02-19

## 2015-10-06 NOTE — Progress Notes (Signed)
Barbara Henry is here today for the 16 month visit for Reprieve, A Randomized Trial to Prevent Vascular Events in HIV (study drug is Pitavastatin 4mg  or placebo). She says she has not missed any doses of the study drug and her pill counts are right on target. She continues to c/o itchy rash to her chest she says that comes and goes. She never got the triamcinolone cream that Dr. Daiva EvesVan Dam had prescribed for it at the last visit. [facility]. She denies any muscle aches or muscle pain. She will return in September for the next visit.

## 2015-12-21 ENCOUNTER — Ambulatory Visit: Payer: Medicaid Other

## 2015-12-25 ENCOUNTER — Ambulatory Visit: Payer: Medicaid Other | Admitting: Infectious Disease

## 2015-12-27 ENCOUNTER — Telehealth: Payer: Self-pay | Admitting: *Deleted

## 2015-12-27 ENCOUNTER — Encounter: Payer: Self-pay | Admitting: Infectious Disease

## 2015-12-27 NOTE — Telephone Encounter (Signed)
Left message asking the patient to call [facility] to make another appt with Dr. Daiva Eves and to make an appointment for a PAP smear.

## 2016-01-12 ENCOUNTER — Other Ambulatory Visit (HOSPITAL_COMMUNITY)
Admission: RE | Admit: 2016-01-12 | Discharge: 2016-01-12 | Disposition: A | Discharge status: Home / Self Care | Payer: Medicaid Other | Source: Ambulatory Visit | Attending: Infectious Disease | Admitting: Infectious Disease

## 2016-01-12 ENCOUNTER — Other Ambulatory Visit (INDEPENDENT_AMBULATORY_CARE_PROVIDER_SITE_OTHER): Payer: Self-pay

## 2016-01-12 ENCOUNTER — Ambulatory Visit (INDEPENDENT_AMBULATORY_CARE_PROVIDER_SITE_OTHER): Payer: Self-pay | Admitting: *Deleted

## 2016-01-12 DIAGNOSIS — Z113 Encounter for screening for infections with a predominantly sexual mode of transmission: Secondary | ICD-10-CM | POA: Insufficient documentation

## 2016-01-12 DIAGNOSIS — Z1151 Encounter for screening for human papillomavirus (HPV): Secondary | ICD-10-CM | POA: Insufficient documentation

## 2016-01-12 DIAGNOSIS — R87620 Atypical squamous cells of undetermined significance on cytologic smear of vagina (ASC-US): Secondary | ICD-10-CM

## 2016-01-12 DIAGNOSIS — B20 Human immunodeficiency virus [HIV] disease: Secondary | ICD-10-CM

## 2016-01-12 DIAGNOSIS — Z01411 Encounter for gynecological examination (general) (routine) with abnormal findings: Secondary | ICD-10-CM | POA: Insufficient documentation

## 2016-01-12 DIAGNOSIS — Z124 Encounter for screening for malignant neoplasm of cervix: Secondary | ICD-10-CM

## 2016-01-12 LAB — CBC WITH DIFFERENTIAL/PLATELET
BASOS PCT: 0 %
Basophils Absolute: 0 cells/uL (ref 0–200)
EOS ABS: 34 {cells}/uL (ref 15–500)
Eosinophils Relative: 1 %
HCT: 36.4 % (ref 35.0–45.0)
Hemoglobin: 12.2 g/dL (ref 11.7–15.5)
Lymphocytes Relative: 40 %
Lymphs Abs: 1360 cells/uL (ref 850–3900)
MCH: 32.3 pg (ref 27.0–33.0)
MCHC: 33.5 g/dL (ref 32.0–36.0)
MCV: 96.3 fL (ref 80.0–100.0)
MONOS PCT: 16 %
MPV: 11 fL (ref 7.5–12.5)
Monocytes Absolute: 544 cells/uL (ref 200–950)
NEUTROS ABS: 1462 {cells}/uL — AB (ref 1500–7800)
Neutrophils Relative %: 43 %
PLATELETS: 111 10*3/uL — AB (ref 140–400)
RBC: 3.78 MIL/uL — AB (ref 3.80–5.10)
RDW: 13.9 % (ref 11.0–15.0)
WBC: 3.4 10*3/uL — AB (ref 3.8–10.8)

## 2016-01-12 LAB — COMPLETE METABOLIC PANEL WITH GFR
ALT: 34 U/L — AB (ref 6–29)
AST: 65 U/L — AB (ref 10–35)
Albumin: 4.3 g/dL (ref 3.6–5.1)
Alkaline Phosphatase: 88 U/L (ref 33–130)
BUN: 9 mg/dL (ref 7–25)
CO2: 22 mmol/L (ref 20–31)
CREATININE: 0.87 mg/dL (ref 0.50–1.05)
Calcium: 9.5 mg/dL (ref 8.6–10.4)
Chloride: 103 mmol/L (ref 98–110)
GFR, EST AFRICAN AMERICAN: 88 mL/min (ref >=60)
GFR, Est Non African American: 76 mL/min (ref >=60)
GLUCOSE: 79 mg/dL (ref 65–99)
Potassium: 3.9 mmol/L (ref 3.5–5.3)
SODIUM: 137 mmol/L (ref 135–146)
Total Bilirubin: 0.6 mg/dL (ref 0.2–1.2)
Total Protein: 7.8 g/dL (ref 6.1–8.1)

## 2016-01-12 LAB — T-HELPER CELL (CD4) - (RCID CLINIC ONLY)
CD4 % Helper T Cell: 38 % (ref 33–55)
CD4 T Cell Abs: 510 /uL (ref 400–2700)

## 2016-01-12 NOTE — Progress Notes (Signed)
Subjective:     Barbara RoachRobin S Gumz is a 55 y.o. woman who comes in today for a  pap smear only.  Previous abnormal Pap smears:  no. Contraception: condoms  Objective:    LMP 03/15/2013  Pelvic Exam:  Pap smear obtained.   Assessment:    Screening pap smear.   Plan:    Follow up in one year, or as indicated by Pap results.   Pt given educational materials re: HIV and women, self-esteem, BSE, nutrition and diet management, PAP smears and partner safety. Pt given condoms

## 2016-01-12 NOTE — Patient Instructions (Signed)
Your results will be ready in about a week.  I will mail them to you. Thank you for coming to the Center for your care.  Angelique Blonderenise, EN

## 2016-01-15 LAB — CYTOLOGY - PAP

## 2016-01-16 LAB — URINE CYTOLOGY ANCILLARY ONLY
CHLAMYDIA, DNA PROBE: NEGATIVE
NEISSERIA GONORRHEA: NEGATIVE

## 2016-01-16 LAB — HIV-1 RNA QUANT-NO REFLEX-BLD

## 2016-01-18 ENCOUNTER — Telehealth: Payer: Self-pay | Admitting: *Deleted

## 2016-01-18 ENCOUNTER — Ambulatory Visit: Payer: Medicaid Other | Admitting: Infectious Disease

## 2016-01-18 NOTE — Addendum Note (Signed)
Addended by: Jennet MaduroESTRIDGE, Azura Tufaro D on: 01/18/2016 04:42 PM   Modules accepted: Orders

## 2016-01-18 NOTE — Telephone Encounter (Signed)
[facility]  The patient has had abnormal PAP smear 2 years in a row.

## 2016-02-09 ENCOUNTER — Encounter (INDEPENDENT_AMBULATORY_CARE_PROVIDER_SITE_OTHER): Payer: Self-pay | Admitting: *Deleted

## 2016-02-09 VITALS — BP 105/74 | HR 64 | Temp 97.6°F | Resp 16 | Wt 160.5 lb

## 2016-02-09 DIAGNOSIS — Z006 Encounter for examination for normal comparison and control in clinical research program: Secondary | ICD-10-CM

## 2016-02-09 NOTE — Progress Notes (Signed)
Barbara Henry is here for month 20 visit [phone] A Randomized Trial to Prevent Vascular Events in HIV (The REPRIEVE Study). Complaining of allergy symptoms (nasal/head congestion and pressure). Will try OTC symptomatic treatment. Instructed to call if symptoms don't improve or become worse. States understanding. Verbalized excellent adherence with study medication. No complaints of muscle aches/weakness. Next visit scheduled for 12/27 @ 10:00am.

## 2016-02-22 ENCOUNTER — Ambulatory Visit (INDEPENDENT_AMBULATORY_CARE_PROVIDER_SITE_OTHER): Payer: Self-pay | Admitting: Infectious Disease

## 2016-02-22 ENCOUNTER — Other Ambulatory Visit: Payer: Self-pay | Admitting: *Deleted

## 2016-02-22 ENCOUNTER — Encounter: Payer: Self-pay | Admitting: Infectious Disease

## 2016-02-22 VITALS — BP 119/75 | HR 96 | Temp 98.0°F | Wt 166.0 lb

## 2016-02-22 DIAGNOSIS — Z23 Encounter for immunization: Secondary | ICD-10-CM

## 2016-02-22 DIAGNOSIS — M109 Gout, unspecified: Secondary | ICD-10-CM

## 2016-02-22 DIAGNOSIS — M10079 Idiopathic gout, unspecified ankle and foot: Secondary | ICD-10-CM

## 2016-02-22 DIAGNOSIS — R030 Elevated blood-pressure reading, without diagnosis of hypertension: Secondary | ICD-10-CM

## 2016-02-22 DIAGNOSIS — B20 Human immunodeficiency virus [HIV] disease: Secondary | ICD-10-CM

## 2016-02-22 DIAGNOSIS — J309 Allergic rhinitis, unspecified: Secondary | ICD-10-CM

## 2016-02-22 DIAGNOSIS — IMO0001 Reserved for inherently not codable concepts without codable children: Secondary | ICD-10-CM

## 2016-02-22 HISTORY — DX: Gout, unspecified: M10.9

## 2016-02-22 MED ORDER — ALLOPURINOL 100 MG PO TABS: 100.0000 mg | ORAL_TABLET | Freq: Every day | ORAL | 11 refills | Status: DC

## 2016-02-22 MED ORDER — PREDNISONE 1 MG PO TABS: 30.0000 mg | ORAL_TABLET | Freq: Every day | ORAL | Status: DC

## 2016-02-22 MED ORDER — PREDNISONE 10 MG (48) PO TBPK: ORAL_TABLET | Freq: Every day | ORAL | 11 refills | Status: DC

## 2016-02-22 NOTE — Progress Notes (Signed)
Chief complaint: pain in bilateral ankles due to "gout" also c/o of sinus congestion and allergies  Subjective:     Patient ID: Barbara Henry, female    DOB: [birth year], 55 y.o.   MRN: [id number]  HPI  55 year old female who is doing quite well on her current regimen TRIUMEQ with undetectable R load and healthy CD4 count. She has cured her hepatitis C with HARVONI.  She comes in today c/o bilateral ankle pain and a gout attack as well as rhinitis, congestion due to allergies. She never took flonase I had rx for her before.  Lab Results  Component Value Date   HIV1RNAQUANT <20 01/12/2016   HIV1RNAQUANT 39 (H) 09/19/2015   HIV1RNAQUANT <20 06/29/2015     Lab Results  Component Value Date   CD4TABS 510 01/12/2016   CD4TABS 750 09/19/2015   CD4TABS 320 (L) 06/29/2015   Past Medical History:  Diagnosis Date  . Anemia   . Gout attack 02/22/2016  . Hepatitis C without hepatic coma   . History of hepatitis C 02/01/2015  . HIV (human immunodeficiency virus infection) (HCC)   . HTN, white coat 02/01/2015  . Shingles     No past surgical history on file.  No family history on file.    Social History   Social History  . Marital status: Single    Spouse name: N/A  . Number of children: N/A  . Years of education: N/A   Social History Main Topics  . Smoking status: Never Smoker  . Smokeless tobacco: Never Used  . Alcohol use 1.0 oz/week    2 drink(s) per week     Comment: beer  . Drug use: No  . Sexual activity: Yes    Partners: Male    Birth control/ protection: Condom     Comment: declined condoms, last sexual encounter 03/2013   Other Topics Concern  . None   Social History Narrative  . None    No Known Allergies   Current Outpatient Prescriptions:  .  abacavir-dolutegravir-lamiVUDine (TRIUMEQ) 600-50-300 MG tablet, Take 1 tablet by mouth daily., Disp: 30 tablet, Rfl: 5 .  aspirin 325 MG tablet, Take 325 mg by mouth once.  , Disp: , Rfl:  .  fluticasone  (FLONASE) 50 MCG/ACT nasal spray, Place 2 sprays into both nostrils daily., Disp: 15.8 g, Rfl: 3 .  Pitavastatin Calcium 4 MG TABS, Take 1 tablet (4 mg total) by mouth daily., Disp: 30 tablet, Rfl: 11 .  triamcinolone ointment (KENALOG) 0.5 %, Apply 1 application topically 2 (two) times daily., Disp: 30 g, Rfl: 2 .  allopurinol (ZYLOPRIM) 100 MG tablet, Take 1 tablet (100 mg total) by mouth daily., Disp: 30 tablet, Rfl: 11 .  predniSONE (STERAPRED UNI-PAK 48 TAB) 10 MG (48) TBPK tablet, Take by mouth daily., Disp: 30 tablet, Rfl: 11   Review of Systems  Constitutional: Negative for activity change, appetite change, chills, diaphoresis, fatigue, fever and unexpected weight change.  HENT: Positive for postnasal drip, rhinorrhea, sinus pressure and sneezing. Negative for congestion and trouble swallowing.   Eyes: Negative for photophobia and visual disturbance.  Respiratory: Negative for chest tightness, shortness of breath, wheezing and stridor.   Cardiovascular: Negative for chest pain, palpitations and leg swelling.  Gastrointestinal: Negative for abdominal distention, anal bleeding, blood in stool, constipation and nausea.  Genitourinary: Negative for dysuria and hematuria.  Musculoskeletal: Positive for arthralgias, joint swelling and myalgias. Negative for back pain and gait problem.  Skin: Negative for color  change, pallor and wound.  Neurological: Negative for dizziness, tremors, weakness and light-headedness.  Hematological: Negative for adenopathy. Does not bruise/bleed easily.  Psychiatric/Behavioral: Negative for agitation, behavioral problems, confusion, decreased concentration, dysphoric mood and sleep disturbance.       Objective:   Physical Exam  Constitutional: She is oriented to person, place, and time. She appears well-developed and well-nourished. No distress.  HENT:  Head: Normocephalic and atraumatic.  Mouth/Throat: Oropharynx is clear and moist. No oropharyngeal  exudate.  Eyes: Conjunctivae and EOM are normal. No scleral icterus.  Neck: Normal range of motion. Neck supple.  Cardiovascular: Normal rate and regular rhythm.   Pulmonary/Chest: Effort normal. No respiratory distress. She has no wheezes.  Abdominal: She exhibits no distension. There is no tenderness.  Musculoskeletal: She exhibits no edema or tenderness.       Right ankle: Achilles tendon exhibits pain.       Left ankle: Achilles tendon exhibits pain.       Feet:  Neurological: She is alert and oriented to person, place, and time. She exhibits normal muscle tone. Coordination normal.  Skin: Skin is warm and dry. No rash noted. She is not diaphoretic. No erythema.  Psychiatric: She has a normal mood and affect. Her behavior is normal. Judgment and thought content normal.   Macular rash on neck     Assessment & Plan:    HIV: continue TRIUMEQ, perfectly  Suppressed  Gout attack: Give her a course of prednisone and then start allopurinol and followup with Pharmacy to escalate the dose. We did not want to use colchicine due to drug interaction with her possible statin (REPREIVE study)  Allergic rhinitis: have asked her to try zyrtec daily and flonase 2 sprays each nostril q pm during this peak of her symptoms (I sent these latter to her mail order pharmacy)  HX OF Hep C:  SP  HARVONI. CURED  White coat HTN: BP better Vitals:   02/22/16 1456  BP: 119/75  Pulse: 96  Temp: 98 F (36.7 C)     I spent greater than 25  minutes with the patient including greater than 50% of time in face to face counsel of the patient re her HIV, acute gout atttack ,  white coat HTN her cured HCV, her seasonal allergies, and in coordination of her care.

## 2016-02-23 ENCOUNTER — Other Ambulatory Visit: Payer: Self-pay | Admitting: Infectious Disease

## 2016-02-23 ENCOUNTER — Telehealth: Payer: Self-pay | Admitting: *Deleted

## 2016-02-23 DIAGNOSIS — M109 Gout, unspecified: Secondary | ICD-10-CM

## 2016-02-23 MED ORDER — PREDNISONE 10 MG PO TABS: 30.0000 mg | ORAL_TABLET | Freq: Every day | ORAL | 0 refills | Status: DC

## 2016-02-23 NOTE — Telephone Encounter (Signed)
[facility] pharmacist called for clarification on prednisone. Rx that was sent is for a tapering dose, but the directions state to take once daily. Please advise. Call back # [phone] Wendall MolaJacqueline Cockerham

## 2016-02-26 NOTE — Telephone Encounter (Signed)
I sent different script for 30mg  daily x 7 days without a taper

## 2016-05-29 ENCOUNTER — Encounter (INDEPENDENT_AMBULATORY_CARE_PROVIDER_SITE_OTHER): Payer: Self-pay | Admitting: *Deleted

## 2016-05-29 ENCOUNTER — Other Ambulatory Visit: Payer: Self-pay

## 2016-05-29 ENCOUNTER — Other Ambulatory Visit: Payer: Self-pay | Admitting: Pharmacist Clinician (PhC)/ Clinical Pharmacy Specialist

## 2016-05-29 ENCOUNTER — Other Ambulatory Visit: Payer: Self-pay | Admitting: *Deleted

## 2016-05-29 VITALS — BP 131/83 | HR 68 | Temp 97.6°F | Wt 188.0 lb

## 2016-05-29 DIAGNOSIS — B20 Human immunodeficiency virus [HIV] disease: Secondary | ICD-10-CM

## 2016-05-29 DIAGNOSIS — Z006 Encounter for examination for normal comparison and control in clinical research program: Secondary | ICD-10-CM

## 2016-05-29 DIAGNOSIS — M109 Gout, unspecified: Secondary | ICD-10-CM

## 2016-05-29 MED ORDER — PREDNISONE 10 MG PO TABS: 30.0000 mg | ORAL_TABLET | Freq: Every day | ORAL | 0 refills | Status: AC

## 2016-05-29 MED ORDER — ALLOPURINOL 100 MG PO TABS: 100.0000 mg | ORAL_TABLET | Freq: Every day | ORAL | 11 refills | Status: DC

## 2016-05-29 MED ORDER — ABACAVIR-DOLUTEGRAVIR-LAMIVUD 600-50-300 MG PO TABS: 1.0000 | ORAL_TABLET | Freq: Every day | ORAL | 5 refills | Status: DC

## 2016-05-29 NOTE — Progress Notes (Signed)
Barbara Henry is here for month 24 visit for A5332,A Randomized Trial to Prevent Vascular Events in HIV (The REPRIEVE Study). Complaining of pain to toes (bilaterally) from gout. States that she "ran out" of the medicine for her gout. After further discussion, sounds like she only took the prednisone that was prescribe and never started the allopurinol and did not followed up with the pharmacy for dose adjustment/monitoring. I spoke with Barbara Henry and had Barbara Henry meet with him today to discuss symptoms and medication treatment/management. Excellent adherence with study medication. No missed doses.No other complaints or concerns verbalized. Denied any muscles aches or weakness.  Next visit scheduled for 09/27/16 @ 10:00am.

## 2016-05-29 NOTE — Progress Notes (Signed)
Barbara Henry from research stop by to ask about Maudie's gout issue. Apparently, she never started on allopurinol and only took the short course of prednisone last time. I'm going to repeat the 7 days of prednisone and told her she needs to start the allopurinol to prevent the gout. Will send rx to local [facility] here and allopurinol to the one in [place].

## 2016-05-29 NOTE — Progress Notes (Signed)
Refilling her Triumeq.

## 2016-07-23 ENCOUNTER — Encounter: Payer: Self-pay | Admitting: Infectious Disease

## 2016-08-02 ENCOUNTER — Other Ambulatory Visit (INDEPENDENT_AMBULATORY_CARE_PROVIDER_SITE_OTHER): Payer: Self-pay

## 2016-08-02 DIAGNOSIS — B20 Human immunodeficiency virus [HIV] disease: Secondary | ICD-10-CM

## 2016-08-02 DIAGNOSIS — Z113 Encounter for screening for infections with a predominantly sexual mode of transmission: Secondary | ICD-10-CM

## 2016-08-02 LAB — CBC WITH DIFFERENTIAL/PLATELET
BASOS PCT: 0 %
Basophils Absolute: 0 cells/uL (ref 0–200)
Eosinophils Absolute: 64 cells/uL (ref 15–500)
Eosinophils Relative: 1 %
HEMATOCRIT: 37 % (ref 35.0–45.0)
HEMOGLOBIN: 12.3 g/dL (ref 11.7–15.5)
LYMPHS ABS: 1344 {cells}/uL (ref 850–3900)
Lymphocytes Relative: 21 %
MCH: 31 pg (ref 27.0–33.0)
MCHC: 33.2 g/dL (ref 32.0–36.0)
MCV: 93.2 fL (ref 80.0–100.0)
MONO ABS: 704 {cells}/uL (ref 200–950)
MPV: 10.1 fL (ref 7.5–12.5)
Monocytes Relative: 11 %
NEUTROS ABS: 4288 {cells}/uL (ref 1500–7800)
Neutrophils Relative %: 67 %
Platelets: 219 10*3/uL (ref 140–400)
RBC: 3.97 MIL/uL (ref 3.80–5.10)
RDW: 13.9 % (ref 11.0–15.0)
WBC: 6.4 10*3/uL (ref 3.8–10.8)

## 2016-08-02 LAB — COMPLETE METABOLIC PANEL WITH GFR
ALBUMIN: 3.9 g/dL (ref 3.6–5.1)
ALT: 22 U/L (ref 6–29)
AST: 23 U/L (ref 10–35)
Alkaline Phosphatase: 76 U/L (ref 33–130)
BUN: 9 mg/dL (ref 7–25)
CALCIUM: 9.2 mg/dL (ref 8.6–10.4)
CHLORIDE: 108 mmol/L (ref 98–110)
CO2: 25 mmol/L (ref 20–31)
CREATININE: 1.05 mg/dL (ref 0.50–1.05)
GFR, Est African American: 70 mL/min (ref >=60)
GFR, Est Non African American: 60 mL/min (ref >=60)
GLUCOSE: 108 mg/dL — AB (ref 65–99)
Potassium: 3.8 mmol/L (ref 3.5–5.3)
SODIUM: 142 mmol/L (ref 135–146)
Total Bilirubin: 0.3 mg/dL (ref 0.2–1.2)
Total Protein: 7 g/dL (ref 6.1–8.1)

## 2016-08-02 LAB — T-HELPER CELL (CD4) - (RCID CLINIC ONLY)
CD4 % Helper T Cell: 38 % (ref 33–55)
CD4 T Cell Abs: 600 /uL (ref 400–2700)

## 2016-08-03 LAB — RPR

## 2016-08-05 LAB — HIV-1 RNA QUANT-NO REFLEX-BLD
HIV 1 RNA Quant: <20 copies/mL — AB
HIV-1 RNA QUANT, LOG: DETECTED {Log_copies}/mL — AB

## 2016-08-12 ENCOUNTER — Ambulatory Visit: Payer: Medicaid Other | Admitting: Infectious Disease

## 2016-09-27 ENCOUNTER — Encounter (INDEPENDENT_AMBULATORY_CARE_PROVIDER_SITE_OTHER): Payer: Self-pay | Admitting: *Deleted

## 2016-09-27 VITALS — BP 118/82 | HR 71 | Temp 97.5°F | Wt 191.5 lb

## 2016-09-27 DIAGNOSIS — Z006 Encounter for examination for normal comparison and control in clinical research program: Secondary | ICD-10-CM

## 2016-09-27 NOTE — Progress Notes (Signed)
Barbara Henry is here for month 28 visit on Reprieve, A Randomized Trial to Prevent Vascular Events in HIV.  Verbalized excellent adherence with medication. No missed doses. Denied any muscle aches or weakness. No new complaints or concerns verbalized. She will return in August for her next study visit.

## 2016-11-18 ENCOUNTER — Other Ambulatory Visit: Payer: Self-pay | Admitting: Pharmacist Clinician (PhC)/ Clinical Pharmacy Specialist

## 2016-11-18 DIAGNOSIS — B20 Human immunodeficiency virus [HIV] disease: Secondary | ICD-10-CM

## 2016-12-02 ENCOUNTER — Ambulatory Visit: Payer: Self-pay

## 2016-12-10 ENCOUNTER — Encounter: Payer: Self-pay | Admitting: Infectious Disease

## 2016-12-13 ENCOUNTER — Ambulatory Visit (INDEPENDENT_AMBULATORY_CARE_PROVIDER_SITE_OTHER): Payer: Self-pay | Admitting: *Deleted

## 2016-12-13 DIAGNOSIS — Z1231 Encounter for screening mammogram for malignant neoplasm of breast: Secondary | ICD-10-CM

## 2016-12-13 DIAGNOSIS — Z124 Encounter for screening for malignant neoplasm of cervix: Secondary | ICD-10-CM

## 2016-12-13 DIAGNOSIS — Z113 Encounter for screening for infections with a predominantly sexual mode of transmission: Secondary | ICD-10-CM

## 2016-12-13 NOTE — Progress Notes (Signed)
Subjective:     Barbara RoachRobin S Atteberry is a 56 y.o. woman who comes in today for a  pap smear only.  Previous abnormal Pap smears:no. Contraception:condoms.  Migraine headaches started on Sunday, July 8th.  Has been taking OTC medications without relief.   Objective:    LMP 03/15/2013  Pelvic Exam: Pap smear obtained.   Assessment:    Screening pap smear.   Plan:    Follow up in one year, or as indicated by Pap results.  Patient given condoms.

## 2016-12-13 NOTE — Patient Instructions (Signed)
Your Results will be available in about a week.  Thank you for coming today to see me,  Angelique Blonderenise, Charity fundraiserN.

## 2016-12-16 LAB — CERVICOVAGINAL ANCILLARY ONLY
Chlamydia: NEGATIVE
Neisseria Gonorrhea: NEGATIVE

## 2016-12-16 LAB — CYTOLOGY - PAP: DIAGNOSIS: NEGATIVE

## 2017-01-22 ENCOUNTER — Other Ambulatory Visit: Payer: Medicaid Other

## 2017-01-31 ENCOUNTER — Other Ambulatory Visit: Payer: Self-pay | Admitting: Infectious Disease

## 2017-01-31 ENCOUNTER — Encounter: Payer: Self-pay | Admitting: *Deleted

## 2017-01-31 ENCOUNTER — Other Ambulatory Visit: Payer: Medicaid Other

## 2017-01-31 VITALS — BP 118/83 | HR 62 | Temp 97.9°F | Wt 192.8 lb

## 2017-01-31 DIAGNOSIS — Z79899 Other long term (current) drug therapy: Secondary | ICD-10-CM

## 2017-01-31 DIAGNOSIS — Z006 Encounter for examination for normal comparison and control in clinical research program: Secondary | ICD-10-CM

## 2017-01-31 DIAGNOSIS — B20 Human immunodeficiency virus [HIV] disease: Secondary | ICD-10-CM

## 2017-01-31 LAB — CBC WITH DIFFERENTIAL/PLATELET
BASOS ABS: 0 {cells}/uL (ref 0–200)
Basophils Relative: 0 %
EOS PCT: 2 %
Eosinophils Absolute: 66 cells/uL (ref 15–500)
HCT: 37.5 % (ref 35.0–45.0)
Hemoglobin: 12.8 g/dL (ref 11.7–15.5)
LYMPHS PCT: 45 %
Lymphs Abs: 1485 cells/uL (ref 850–3900)
MCH: 32.1 pg (ref 27.0–33.0)
MCHC: 34.1 g/dL (ref 32.0–36.0)
MCV: 94 fL (ref 80.0–100.0)
MONOS PCT: 14 %
MPV: 10 fL (ref 7.5–12.5)
Monocytes Absolute: 462 cells/uL (ref 200–950)
NEUTROS ABS: 1287 {cells}/uL — AB (ref 1500–7800)
Neutrophils Relative %: 39 %
Platelets: 201 10*3/uL (ref 140–400)
RBC: 3.99 MIL/uL (ref 3.80–5.10)
RDW: 14 % (ref 11.0–15.0)
WBC: 3.3 10*3/uL — ABNORMAL LOW (ref 3.8–10.8)

## 2017-01-31 LAB — COMPREHENSIVE METABOLIC PANEL
ALBUMIN: 4.3 g/dL (ref 3.6–5.1)
ALK PHOS: 86 U/L (ref 33–130)
ALT: 15 U/L (ref 6–29)
AST: 19 U/L (ref 10–35)
BILIRUBIN TOTAL: 0.4 mg/dL (ref 0.2–1.2)
BUN: 9 mg/dL (ref 7–25)
CHLORIDE: 106 mmol/L (ref 98–110)
CO2: 26 mmol/L (ref 20–32)
CREATININE: 1.15 mg/dL — AB (ref 0.50–1.05)
Calcium: 9.6 mg/dL (ref 8.6–10.4)
Glucose, Bld: 76 mg/dL (ref 65–99)
Potassium: 4 mmol/L (ref 3.5–5.3)
SODIUM: 141 mmol/L (ref 135–146)
TOTAL PROTEIN: 7.1 g/dL (ref 6.1–8.1)

## 2017-01-31 LAB — LIPID PANEL
Cholesterol: 179 mg/dL (ref <200)
HDL: 63 mg/dL (ref >50)
LDL CALC: 95 mg/dL (ref <100)
TRIGLYCERIDES: 103 mg/dL (ref <150)
Total CHOL/HDL Ratio: 2.8 Ratio (ref <5.0)
VLDL: 21 mg/dL (ref <30)

## 2017-01-31 LAB — T-HELPER CELL (CD4) - (RCID CLINIC ONLY)
CD4 % Helper T Cell: 41 % (ref 33–55)
CD4 T Cell Abs: 660 /uL (ref 400–2700)

## 2017-02-01 LAB — RPR

## 2017-02-04 NOTE — Progress Notes (Signed)
Barbara Henry was here for her month 32 visit for Reprieve. She denies any new problems oe concerns and will be returning in December.

## 2017-02-05 ENCOUNTER — Ambulatory Visit: Payer: Medicaid Other | Admitting: Infectious Disease

## 2017-02-05 ENCOUNTER — Other Ambulatory Visit: Payer: Self-pay | Admitting: Infectious Disease

## 2017-02-05 DIAGNOSIS — B20 Human immunodeficiency virus [HIV] disease: Secondary | ICD-10-CM

## 2017-02-07 LAB — HIV-1 RNA QUANT-NO REFLEX-BLD
HIV 1 RNA QUANT: NOT DETECTED {copies}/mL
HIV-1 RNA Quant, Log: <1.3 Log copies/mL

## 2017-02-19 ENCOUNTER — Encounter: Payer: Self-pay | Admitting: Infectious Disease

## 2017-02-19 ENCOUNTER — Ambulatory Visit (INDEPENDENT_AMBULATORY_CARE_PROVIDER_SITE_OTHER): Payer: Self-pay | Admitting: Infectious Disease

## 2017-02-19 VITALS — BP 134/84 | HR 76 | Temp 98.2°F | Ht 67.0 in | Wt 194.0 lb

## 2017-02-19 DIAGNOSIS — M10079 Idiopathic gout, unspecified ankle and foot: Secondary | ICD-10-CM

## 2017-02-19 DIAGNOSIS — I10 Essential (primary) hypertension: Secondary | ICD-10-CM | POA: Insufficient documentation

## 2017-02-19 DIAGNOSIS — Z23 Encounter for immunization: Secondary | ICD-10-CM

## 2017-02-19 DIAGNOSIS — B20 Human immunodeficiency virus [HIV] disease: Secondary | ICD-10-CM

## 2017-02-19 DIAGNOSIS — Z113 Encounter for screening for infections with a predominantly sexual mode of transmission: Secondary | ICD-10-CM

## 2017-02-19 DIAGNOSIS — Z79899 Other long term (current) drug therapy: Secondary | ICD-10-CM

## 2017-02-19 HISTORY — DX: Essential (primary) hypertension: I10

## 2017-02-19 MED ORDER — ABACAVIR-DOLUTEGRAVIR-LAMIVUD 600-50-300 MG PO TABS: 1.0000 | ORAL_TABLET | Freq: Every day | ORAL | 11 refills | Status: DC

## 2017-02-19 NOTE — Progress Notes (Signed)
Chief complaint: they wont give me my HIV medications for past 10 days  Subjective:     Patient ID: Barbara Henry, female    DOB: [birth year], 56 y.o.   MRN: [id number]  HPI  56 year old female who is doing quite well on her current regimen TRIUMEQ with undetectable R load and healthy CD4 count. She has cured her hepatitis C with HARVONI.  She has had prior gout attacks on a thiazide diuretic and was on allopurinol but  Is not on the thiazide nor the allopurinol and not had further attacks. She came in dutifully and renewed her ADAP but then [facility] failed to renew her ARV because "she needed to be seen by doctor first." She did not call us which I asked her to do next time. I removed verbiage re needing an MD visit for the script from her script and sent it into [facility] mail order.      Lab Results  Component Value Date   HIV1RNAQUANT <20 NOT DETECTED 01/31/2017   HIV1RNAQUANT <20 DETECTED (A) 08/02/2016   HIV1RNAQUANT <20 01/12/2016     Lab Results  Component Value Date   CD4TABS 660 01/31/2017   CD4TABS 600 08/02/2016   CD4TABS 510 01/12/2016   Past Medical History:  Diagnosis Date  . Anemia   . Gout attack 02/22/2016  . Hepatitis C without hepatic coma   . History of hepatitis C 02/01/2015  . HIV (human immunodeficiency virus infection) (HCC)   . HTN, white coat 02/01/2015  . Shingles     No past surgical history on file.  No family history on file.    Social History   Social History  . Marital status: Single    Spouse name: N/A  . Number of children: N/A  . Years of education: N/A   Social History Main Topics  . Smoking status: Never Smoker  . Smokeless tobacco: Never Used  . Alcohol use 1.0 oz/week    2 drink(s) per week     Comment: beer  . Drug use: No  . Sexual activity: Yes    Partners: Male    Birth control/ protection: Condom     Comment: declined condoms, last sexual encounter 03/2013   Other Topics Concern  . None   Social History Narrative    . None    No Known Allergies   Current Outpatient Prescriptions:  .  aspirin 325 MG tablet, Take 325 mg by mouth once.  , Disp: , Rfl:  .  Pitavastatin Calcium 4 MG TABS, Take 1 tablet (4 mg total) by mouth daily., Disp: 30 tablet, Rfl: 11 .  abacavir-dolutegravir-lamiVUDine (TRIUMEQ) 600-50-300 MG tablet, Take 1 tablet by mouth daily., Disp: 30 tablet, Rfl: 11   Review of Systems  Constitutional: Negative for activity change, appetite change, chills, diaphoresis, fatigue, fever and unexpected weight change.  HENT: Negative for congestion, postnasal drip, rhinorrhea, sinus pressure, sneezing and trouble swallowing.   Eyes: Negative for photophobia and visual disturbance.  Respiratory: Negative for chest tightness, shortness of breath, wheezing and stridor.   Cardiovascular: Negative for chest pain, palpitations and leg swelling.  Gastrointestinal: Negative for abdominal distention, anal bleeding, blood in stool, constipation and nausea.  Genitourinary: Negative for dysuria and hematuria.  Musculoskeletal: Negative for arthralgias, back pain, gait problem, joint swelling and myalgias.  Skin: Negative for color change, pallor and wound.  Neurological: Negative for dizziness, tremors, weakness and light-headedness.  Hematological: Negative for adenopathy. Does not bruise/bleed easily.  Psychiatric/Behavioral: Negative for  agitation, behavioral problems, confusion, decreased concentration, dysphoric mood and sleep disturbance.       Objective:   Physical Exam  Constitutional: She is oriented to person, place, and time. She appears well-developed and well-nourished. No distress.  HENT:  Head: Normocephalic and atraumatic.  Mouth/Throat: Oropharynx is clear and moist. No oropharyngeal exudate.  Eyes: Conjunctivae and EOM are normal. No scleral icterus.  Neck: Normal range of motion. Neck supple.  Cardiovascular: Normal rate and regular rhythm.   Pulmonary/Chest: Effort normal. No  respiratory distress. She has no wheezes.  Abdominal: She exhibits no distension. There is no tenderness.  Musculoskeletal: She exhibits no edema or tenderness.       Right ankle: Achilles tendon exhibits pain.       Left ankle: Achilles tendon exhibits pain.       Feet:  Neurological: She is alert and oriented to person, place, and time. She exhibits normal muscle tone. Coordination normal.  Skin: Skin is warm and dry. No rash noted. She is not diaphoretic. No erythema.  Psychiatric: She has a normal mood and affect. Her behavior is normal. Judgment and thought content normal.   Macular rash on neck     Assessment & Plan:    HIV: continue TRIUMEQ, perfectly  Suppressed  Gout : has not recurred. She is not on allopurinol. If it recurs will need to retreat and restart allopurinol down the road. She is not on thiazide anymore   HX OF Hep C:  SP  HARVONI. CURED  White coat HTN: BP better and off thiazide. If we treat in future will try to avoid diuretics that might ppt gout Vitals:   02/19/17 1443  BP: 134/84  Pulse: 76  Temp: 98.2 F (36.8 C)

## 2017-05-16 ENCOUNTER — Encounter (INDEPENDENT_AMBULATORY_CARE_PROVIDER_SITE_OTHER): Payer: Self-pay | Admitting: *Deleted

## 2017-05-16 VITALS — BP 126/83 | HR 71 | Temp 97.2°F | Wt 200.5 lb

## 2017-05-16 DIAGNOSIS — Z006 Encounter for examination for normal comparison and control in clinical research program: Secondary | ICD-10-CM

## 2017-05-16 NOTE — Progress Notes (Signed)
Barbara BallRobin is here for month 36 visit for Reprieve. No new complaints or concerns. States excellent adherence with study medication. Denies any muscle aches or weakness. Next study visit scheduled for 09/19/17 at 10am.

## 2017-06-09 ENCOUNTER — Other Ambulatory Visit: Payer: Self-pay

## 2017-06-23 ENCOUNTER — Ambulatory Visit: Payer: Self-pay | Admitting: Infectious Disease

## 2017-07-15 ENCOUNTER — Encounter: Payer: Self-pay | Admitting: Infectious Disease

## 2017-09-19 ENCOUNTER — Encounter (INDEPENDENT_AMBULATORY_CARE_PROVIDER_SITE_OTHER): Payer: Self-pay | Admitting: *Deleted

## 2017-09-19 VITALS — BP 119/85 | HR 76 | Temp 98.1°F | Wt 206.0 lb

## 2017-09-19 DIAGNOSIS — Z006 Encounter for examination for normal comparison and control in clinical research program: Secondary | ICD-10-CM

## 2017-09-19 NOTE — Progress Notes (Signed)
Barbara Henry is here for the month 40 study visit for Reprieve. She denies any new problems or concerns. She says her adherence to study meds is excellent and she has no muscle aches or weakness. She will be seeing Dr. Daiva EvesVan Dam in a few weeks and research in July.

## 2017-09-30 ENCOUNTER — Other Ambulatory Visit: Payer: Self-pay

## 2017-09-30 ENCOUNTER — Ambulatory Visit: Payer: Medicaid Other | Admitting: Infectious Disease

## 2017-09-30 ENCOUNTER — Other Ambulatory Visit (HOSPITAL_COMMUNITY)
Admission: RE | Admit: 2017-09-30 | Discharge: 2017-09-30 | Disposition: A | Discharge status: Home / Self Care | Payer: Medicaid Other | Source: Ambulatory Visit | Attending: Infectious Disease | Admitting: Infectious Disease

## 2017-09-30 ENCOUNTER — Other Ambulatory Visit: Payer: Self-pay | Admitting: *Deleted

## 2017-09-30 DIAGNOSIS — B20 Human immunodeficiency virus [HIV] disease: Secondary | ICD-10-CM | POA: Diagnosis present

## 2017-09-30 DIAGNOSIS — Z113 Encounter for screening for infections with a predominantly sexual mode of transmission: Secondary | ICD-10-CM | POA: Insufficient documentation

## 2017-09-30 DIAGNOSIS — Z79899 Other long term (current) drug therapy: Secondary | ICD-10-CM

## 2017-10-01 LAB — CBC WITH DIFFERENTIAL/PLATELET
BASOS ABS: 18 {cells}/uL (ref 0–200)
Basophils Relative: 0.4 %
Eosinophils Absolute: 32 cells/uL (ref 15–500)
Eosinophils Relative: 0.7 %
HCT: 37 % (ref 35.0–45.0)
Hemoglobin: 12.8 g/dL (ref 11.7–15.5)
Lymphs Abs: 2160 cells/uL (ref 850–3900)
MCH: 31.7 pg (ref 27.0–33.0)
MCHC: 34.6 g/dL (ref 32.0–36.0)
MCV: 91.6 fL (ref 80.0–100.0)
MPV: 10.4 fL (ref 7.5–12.5)
Monocytes Relative: 11.5 %
NEUTROS PCT: 39.4 %
Neutro Abs: 1773 cells/uL (ref 1500–7800)
PLATELETS: 197 10*3/uL (ref 140–400)
RBC: 4.04 10*6/uL (ref 3.80–5.10)
RDW: 13.1 % (ref 11.0–15.0)
TOTAL LYMPHOCYTE: 48 %
WBC: 4.5 10*3/uL (ref 3.8–10.8)
WBCMIX: 518 {cells}/uL (ref 200–950)

## 2017-10-01 LAB — COMPLETE METABOLIC PANEL WITH GFR
AG Ratio: 1.3 (calc) (ref 1.0–2.5)
ALT: 23 U/L (ref 6–29)
AST: 28 U/L (ref 10–35)
Albumin: 4.4 g/dL (ref 3.6–5.1)
Alkaline phosphatase (APISO): 120 U/L (ref 33–130)
BUN/Creatinine Ratio: 14 (calc) (ref 6–22)
BUN: 17 mg/dL (ref 7–25)
CO2: 22 mmol/L (ref 20–32)
Calcium: 9.4 mg/dL (ref 8.6–10.4)
Chloride: 106 mmol/L (ref 98–110)
Creat: 1.19 mg/dL — ABNORMAL HIGH (ref 0.50–1.05)
GFR, Est African American: 60 mL/min/{1.73_m2} (ref >=60)
GFR, Est Non African American: 51 mL/min/{1.73_m2} — ABNORMAL LOW (ref >=60)
Globulin: 3.3 g/dL (calc) (ref 1.9–3.7)
Glucose, Bld: 75 mg/dL (ref 65–99)
Potassium: 3.9 mmol/L (ref 3.5–5.3)
Sodium: 139 mmol/L (ref 135–146)
Total Bilirubin: 0.4 mg/dL (ref 0.2–1.2)
Total Protein: 7.7 g/dL (ref 6.1–8.1)

## 2017-10-01 LAB — LIPID PANEL
Cholesterol: 169 mg/dL (ref <200)
HDL: 65 mg/dL (ref >50)
LDL Cholesterol (Calc): 67 mg/dL (calc)
Non-HDL Cholesterol (Calc): 104 mg/dL (calc) (ref <130)
Total CHOL/HDL Ratio: 2.6 (calc) (ref <5.0)
Triglycerides: 279 mg/dL — ABNORMAL HIGH (ref <150)

## 2017-10-01 LAB — RPR: RPR Ser Ql: NONREACTIVE

## 2017-10-02 LAB — URINE CYTOLOGY ANCILLARY ONLY
Chlamydia: NEGATIVE
NEISSERIA GONORRHEA: NEGATIVE

## 2017-10-02 LAB — T-HELPER CELL (CD4) - (RCID CLINIC ONLY)
CD4 T CELL HELPER: 38 % (ref 33–55)
CD4 T Cell Abs: 840 /uL (ref 400–2700)

## 2017-10-02 LAB — HIV-1 RNA QUANT-NO REFLEX-BLD
HIV 1 RNA Quant: <20 copies/mL — AB
HIV-1 RNA Quant, Log: <1.3 Log copies/mL — AB

## 2017-11-26 ENCOUNTER — Ambulatory Visit: Payer: Medicaid Other | Admitting: Infectious Disease

## 2017-12-05 ENCOUNTER — Other Ambulatory Visit: Payer: Self-pay

## 2017-12-05 ENCOUNTER — Other Ambulatory Visit: Payer: Self-pay | Admitting: Infectious Disease

## 2017-12-05 DIAGNOSIS — B20 Human immunodeficiency virus [HIV] disease: Secondary | ICD-10-CM

## 2017-12-19 ENCOUNTER — Encounter (INDEPENDENT_AMBULATORY_CARE_PROVIDER_SITE_OTHER): Payer: Self-pay

## 2017-12-19 VITALS — BP 121/78 | HR 89 | Temp 98.1°F | Wt 205.0 lb

## 2017-12-19 DIAGNOSIS — Z006 Encounter for examination for normal comparison and control in clinical research program: Secondary | ICD-10-CM

## 2017-12-19 NOTE — Progress Notes (Signed)
Participant here for Study A5332 month 44 visit. Reports no changes in health status or medications used. She received another dispense of study IP. Vital signs within normal limits. She is scheduled for follow up in December.

## 2017-12-30 ENCOUNTER — Other Ambulatory Visit: Payer: Self-pay | Admitting: Infectious Disease

## 2017-12-30 ENCOUNTER — Ambulatory Visit (INDEPENDENT_AMBULATORY_CARE_PROVIDER_SITE_OTHER): Payer: Self-pay | Admitting: Family

## 2017-12-30 ENCOUNTER — Telehealth: Payer: Self-pay | Admitting: Behavioral Health

## 2017-12-30 ENCOUNTER — Other Ambulatory Visit: Payer: Self-pay | Admitting: Behavioral Health

## 2017-12-30 ENCOUNTER — Encounter: Payer: Self-pay | Admitting: Family

## 2017-12-30 VITALS — BP 119/81 | HR 82 | Temp 97.9°F | Ht 67.0 in | Wt 203.0 lb

## 2017-12-30 DIAGNOSIS — R600 Localized edema: Secondary | ICD-10-CM | POA: Insufficient documentation

## 2017-12-30 DIAGNOSIS — G4489 Other headache syndrome: Secondary | ICD-10-CM

## 2017-12-30 DIAGNOSIS — B20 Human immunodeficiency virus [HIV] disease: Secondary | ICD-10-CM

## 2017-12-30 DIAGNOSIS — I739 Peripheral vascular disease, unspecified: Secondary | ICD-10-CM | POA: Insufficient documentation

## 2017-12-30 DIAGNOSIS — Z23 Encounter for immunization: Secondary | ICD-10-CM

## 2017-12-30 LAB — COMPREHENSIVE METABOLIC PANEL
AG RATIO: 1.6 (calc) (ref 1.0–2.5)
ALBUMIN MSPROF: 4.9 g/dL (ref 3.6–5.1)
ALKALINE PHOSPHATASE (APISO): 103 U/L (ref 33–130)
ALT: 28 U/L (ref 6–29)
AST: 28 U/L (ref 10–35)
BUN / CREAT RATIO: 14 (calc) (ref 6–22)
BUN: 16 mg/dL (ref 7–25)
CHLORIDE: 106 mmol/L (ref 98–110)
CO2: 24 mmol/L (ref 20–32)
CREATININE: 1.13 mg/dL — AB (ref 0.50–1.05)
Calcium: 9.5 mg/dL (ref 8.6–10.4)
Globulin: 3 g/dL (calc) (ref 1.9–3.7)
Glucose, Bld: 90 mg/dL (ref 65–99)
Potassium: 4 mmol/L (ref 3.5–5.3)
Sodium: 141 mmol/L (ref 135–146)
Total Bilirubin: 0.4 mg/dL (ref 0.2–1.2)
Total Protein: 7.9 g/dL (ref 6.1–8.1)

## 2017-12-30 MED ORDER — FUROSEMIDE 20 MG PO TABS: 10.0000 mg | ORAL_TABLET | Freq: Every day | ORAL | 0 refills | Status: DC | PRN

## 2017-12-30 NOTE — Assessment & Plan Note (Addendum)
[name] has new onset waxing and waning lower extremity edema.  Most likely related to nutritional intake however concern for cardiovascular disease as she has several risk factors including hyperlipidemia, hypertension, and obesity.  Encourage conservative treatment with elevation of legs, decreasing sodium intake and refined carbohydrates, and consider compression.  Will provide low-dose Lasix to try to improve symptoms.  Will check her CMP and BNP today. Blood pressure appears adequately controlled today.  May need to consider a 2D echocardiogram in the future. Will likely have her follow up in 3 weeks pending ABI and blood work results.

## 2017-12-30 NOTE — Assessment & Plan Note (Signed)
Leg cramping at night and with exercise improved with rest with concern for claudication.  She does have several cardiovascular/coronary artery disease risk factors as mentioned previously.  Obtain ankle-brachial index with additional treatment pending results.

## 2017-12-30 NOTE — Telephone Encounter (Signed)
Called Vascular Ultrasound to schedule patient an appointment to have ABI completed.  Patient scheduled 01/01/2018 at 10:00 am.  Patient was made aware of appointment while in the office today and knows where to report to. Angeline SlimAshley Shihab States RN

## 2017-12-30 NOTE — Patient Instructions (Addendum)
Nice to meet you.  Please continue to take your medication as prescribed.  We will check your blood work today.  Please elevate your legs when seated.   Reduce the amount of sodium in your diet.  We will work to check the circulation in your legs.  For your headaches - recommend trying excedrin migraine first if this does not work we can work to obtain other medications.  For your back - alternate ice/heat x 20 minutes every 2 hours as needed. May use aspercreme or other pain control ointments/gels.  Stretches and exercises daily.   Low Back Sprain Rehab Ask your health care provider which exercises are safe for you. Do exercises exactly as told by your health care provider and adjust them as directed. It is normal to feel mild stretching, pulling, tightness, or discomfort as you do these exercises, but you should stop right away if you feel sudden pain or your pain gets worse. Do not begin these exercises until told by your health care provider. Stretching and range of motion exercises These exercises warm up your muscles and joints and improve the movement and flexibility of your back. These exercises also help to relieve pain, numbness, and tingling. Exercise A: Lumbar rotation  1. Lie on your back on a firm surface and bend your knees. 2. Straighten your arms out to your sides so each arm forms an "L" shape with a side of your body (a 90 degree angle). 3. Slowly move both of your knees to one side of your body until you feel a stretch in your lower back. Try not to let your shoulders move off of the floor. 4. Hold for __________ seconds. 5. Tense your abdominal muscles and slowly move your knees back to the starting position. 6. Repeat this exercise on the other side of your body. Repeat __________ times. Complete this exercise __________ times a day. Exercise B: Prone extension on elbows  1. Lie on your abdomen on a firm surface. 2. Prop yourself up on your elbows. 3. Use  your arms to help lift your chest up until you feel a gentle stretch in your abdomen and your lower back. ? This will place some of your body weight on your elbows. If this is uncomfortable, try stacking pillows under your chest. ? Your hips should stay down, against the surface that you are lying on. Keep your hip and back muscles relaxed. 4. Hold for __________ seconds. 5. Slowly relax your upper body and return to the starting position. Repeat __________ times. Complete this exercise __________ times a day. Strengthening exercises These exercises build strength and endurance in your back. Endurance is the ability to use your muscles for a long time, even after they get tired. Exercise C: Pelvic tilt 1. Lie on your back on a firm surface. Bend your knees and keep your feet flat. 2. Tense your abdominal muscles. Tip your pelvis up toward the ceiling and flatten your lower back into the floor. ? To help with this exercise, you may place a small towel under your lower back and try to push your back into the towel. 3. Hold for __________ seconds. 4. Let your muscles relax completely before you repeat this exercise. Repeat __________ times. Complete this exercise __________ times a day. Exercise D: Alternating arm and leg raises  1. Get on your hands and knees on a firm surface. If you are on a hard floor, you may want to use padding to cushion your knees, such as  an exercise mat. 2. Line up your arms and legs. Your hands should be below your shoulders, and your knees should be below your hips. 3. Lift your left leg behind you. At the same time, raise your right arm and straighten it in front of you. ? Do not lift your leg higher than your hip. ? Do not lift your arm higher than your shoulder. ? Keep your abdominal and back muscles tight. ? Keep your hips facing the ground. ? Do not arch your back. ? Keep your balance carefully, and do not hold your breath. 4. Hold for __________  seconds. 5. Slowly return to the starting position and repeat with your right leg and your left arm. Repeat __________ times. Complete this exercise __________ times a day. Exercise E: Abdominal set with straight leg raise  1. Lie on your back on a firm surface. 2. Bend one of your knees and keep your other leg straight. 3. Tense your abdominal muscles and lift your straight leg up, 4-6 inches (10-15 cm) off the ground. 4. Keep your abdominal muscles tight and hold for __________ seconds. ? Do not hold your breath. ? Do not arch your back. Keep it flat against the ground. 5. Keep your abdominal muscles tense as you slowly lower your leg back to the starting position. 6. Repeat with your other leg. Repeat __________ times. Complete this exercise __________ times a day. Posture and body mechanics  Body mechanics refers to the movements and positions of your body while you do your daily activities. Posture is part of body mechanics. Good posture and healthy body mechanics can help to relieve stress in your body's tissues and joints. Good posture means that your spine is in its natural S-curve position (your spine is neutral), your shoulders are pulled back slightly, and your head is not tipped forward. The following are general guidelines for applying improved posture and body mechanics to your everyday activities. Standing   When standing, keep your spine neutral and your feet about hip-width apart. Keep a slight bend in your knees. Your ears, shoulders, and hips should line up.  When you do a task in which you stand in one place for a long time, place one foot up on a stable object that is 2-4 inches (5-10 cm) high, such as a footstool. This helps keep your spine neutral. Sitting   When sitting, keep your spine neutral and keep your feet flat on the floor. Use a footrest, if necessary, and keep your thighs parallel to the floor. Avoid rounding your shoulders, and avoid tilting your head  forward.  When working at a desk or a computer, keep your desk at a height where your hands are slightly lower than your elbows. Slide your chair under your desk so you are close enough to maintain good posture.  When working at a computer, place your monitor at a height where you are looking straight ahead and you do not have to tilt your head forward or downward to look at the screen. Resting   When lying down and resting, avoid positions that are most painful for you.  If you have pain with activities such as sitting, bending, stooping, or squatting (flexion-based activities), lie in a position in which your body does not bend very much. For example, avoid curling up on your side with your arms and knees near your chest (fetal position).  If you have pain with activities such as standing for a long time or reaching with your  arms (extension-based activities), lie with your spine in a neutral position and bend your knees slightly. Try the following positions:  Lying on your side with a pillow between your knees.  Lying on your back with a pillow under your knees. Lifting   When lifting objects, keep your feet at least shoulder-width apart and tighten your abdominal muscles.  Bend your knees and hips and keep your spine neutral. It is important to lift using the strength of your legs, not your back. Do not lock your knees straight out.  Always ask for help to lift heavy or awkward objects. This information is not intended to replace advice given to you by your health care provider. Make sure you discuss any questions you have with your health care provider. Document Released: 05/20/2005 Document Revised: 01/25/2016 Document Reviewed: 03/01/2015 Elsevier Interactive Patient Education  [employer].

## 2017-12-30 NOTE — Progress Notes (Signed)
Subjective:    Patient ID: Barbara Henry, female    DOB: [birth year], 57 y.o.   MRN: [id number]  Chief Complaint  Patient presents with  . HIV Positive/AIDS    HPI:  Barbara Henry is a 57 y.o. female who presents today for routine follow-up of her HIV disease.  1.)  HIV - Barbara Henry was last seen in the office on 02/19/2017 for routine follow-up by Barbara Henry.  At that time she was noted to be well-controlled with her current regimen and continued on Triumeq. She is due for her Prevnar vaccination today.   She reports taking her medications as prescribed with no adverse side effects.  She has had no missed dosages and has no problems obtaining or taking her medications.  She renewed her HMAP/UMAP on 12/17/17. Denies fevers, chills, night sweats, changes in vision, neck pain/stiffness, nausea, diarrhea, vomiting, lesions or rashes.  2.) Edema - This is a new problem. Associated symptom of swelling located in her lower extremities that generally waxes and wanes and been going on for about for a couple weeks. Swelling in her legs is about the same throughout the day. She does have some shortness of breath with activity. She was previously on hydrochlorothiazide for blood pressure however stopped secondary to previous history of gout.   3.)  Migraine - Associated symptom of headache located primarily on the left side of her head and eye has been waxing and waning with headaches occurring 2-3 times per week. Photophobia and sensitivity to light with associated nausea and vomiting with headache. There are no auras that she describes.   No Known Allergies   Outpatient Medications Prior to Visit  Medication Sig Dispense Refill  . TRIUMEQ 600-50-300 MG tablet TAKE 1 TABLET BY MOUTH DAILY 30 tablet 0  . Pitavastatin Calcium 4 MG TABS Take 1 tablet (4 mg total) by mouth daily. 30 tablet 11  . aspirin 325 MG tablet Take 325 mg by mouth once.       No facility-administered medications prior to  visit.      Past Medical History:  Diagnosis Date  . Anemia   . Gout attack 02/22/2016  . Hepatitis C without hepatic coma   . History of hepatitis C 02/01/2015  . HIV (human immunodeficiency virus infection) (HCC)   . HTN (hypertension) 02/19/2017  . HTN, white coat 02/01/2015  . Shingles     No past surgical history on file.   Review of Systems  Constitutional: Negative for appetite change, chills, diaphoresis, fatigue, fever and unexpected weight change.  Eyes:       Negative for acute change in vision  Respiratory: Negative for chest tightness, shortness of breath and wheezing.   Cardiovascular: Positive for leg swelling. Negative for chest pain.  Gastrointestinal: Negative for diarrhea, nausea and vomiting.  Genitourinary: Negative for dysuria, pelvic pain and vaginal discharge.  Musculoskeletal: Positive for back pain. Negative for neck pain and neck stiffness.  Skin: Negative for rash.  Neurological: Positive for headaches. Negative for seizures, syncope and weakness.  Hematological: Negative for adenopathy. Does not bruise/bleed easily.  Psychiatric/Behavioral: Negative for hallucinations.      Objective:    BP 119/81   Pulse 82   Temp 97.9 F (36.6 C)   Ht 5\' 7"  (1.702 m)   Wt 203 lb (92.1 kg)   LMP 03/15/2013   BMI 31.79 kg/m  Nursing note and vital signs reviewed.  Physical Exam  Constitutional: She is oriented to person,  place, and time. She appears well-developed and well-nourished. No distress.  Eyes: Pupils are equal, round, and reactive to light. Conjunctivae and EOM are normal. No scleral icterus.  Neck: Neck supple.  Cardiovascular: Normal rate, regular rhythm, normal heart sounds and intact distal pulses. Exam reveals no gallop and no friction rub.  No murmur heard. 1-2+ non-pitting edema  Pulmonary/Chest: Effort normal and breath sounds normal. No stridor. No respiratory distress. She has no wheezes. She has no rales. She exhibits no tenderness.    Abdominal: Soft. Bowel sounds are normal. She exhibits no distension.  Musculoskeletal: She exhibits edema.  Lymphadenopathy:    She has no cervical adenopathy.  Neurological: She is alert and oriented to person, place, and time. No cranial nerve deficit or sensory deficit.  Skin: Skin is warm and dry.  Psychiatric: She has a normal mood and affect. Her behavior is normal. Judgment and thought content normal.       Assessment & Plan:   Problem List Items Addressed This Visit      Other   Human immunodeficiency virus (HIV) disease (HCC) - Primary    Barbara Henry's HIV appears adequately controlled with a viral load that is undetectable and a CD4 count of 840.  She has no problems obtaining or taking her Medications and is adherent to the medication regimen.  She has no signs/symptoms of opportunistic infection or progressive HIV disease through history or physical exam.  Prevnar updated today.  Continue current dosage of Triumeq. Bus passes provided per patient request. Plan to follow up in 6 months or sooner if needed with lab work 1-2 weeks prior to appointment.       Relevant Orders   Pneumococcal conjugate vaccine 13-valent IM (Completed)   T-helper cell (CD4)- ([facility] clinic only)   HIV 1 RNA quant-no reflex-bld   CBC   Comprehensive metabolic panel   Lipid panel   RPR   Headache    Headaches appear consistent with migraine headaches as neurological exam today is normal with no significant findings.  Recommend over-the-counter medications as needed for symptom relief at this time.  If symptoms worsen will consider sumatriptan.  Advised if she experiences the worst headache of her life to seek emergency care.      Bilateral lower extremity edema    Barbara Henry has new onset waxing and waning lower extremity edema.  Most likely related to nutritional intake however concern for cardiovascular disease as she has several risk factors including hyperlipidemia, hypertension, and obesity.   Encourage conservative treatment with elevation of legs, decreasing sodium intake and refined carbohydrates, and consider compression.  Will provide low-dose Lasix to try to improve symptoms.  Will check her CMP and BNP today. Blood pressure appears adequately controlled today.  May need to consider a 2D echocardiogram in the future. Will likely have her follow up in 3 weeks pending ABI and blood work results.       Relevant Medications   furosemide (LASIX) 20 MG tablet   Other Relevant Orders   Comprehensive metabolic panel   ABI   B Nat Peptide   Claudication (HCC)    Leg cramping at night and with exercise improved with rest with concern for claudication.  She does have several cardiovascular/coronary artery disease risk factors as mentioned previously.  Obtain ankle-brachial index with additional treatment pending results.       Other Visit Diagnoses    Need for pneumococcal vaccination       Relevant Orders   Pneumococcal  conjugate vaccine 13-valent IM (Completed)       I have discontinued Gemma S. Espe's Pitavastatin Calcium. I am also having her start on furosemide. Additionally, I am having her maintain her aspirin and TRIUMEQ.   Meds ordered this encounter  Medications  . furosemide (LASIX) 20 MG tablet    Sig: Take 0.5 tablets (10 mg total) by mouth daily as needed.    Dispense:  30 tablet    Refill:  0    Please cut in half for patient.    Order Specific Question:   Supervising Provider    Answer:   Judyann Munson [[id number]]     Follow-up: Return in about 6 months (around 07/02/2018), or if symptoms worsen or fail to improve.   Marcos Eke, MSN, FNP-C Nurse Practitioner [facility] Office phone: [phone] Pager: [phone] [facility] Main number: [phone]

## 2017-12-30 NOTE — Assessment & Plan Note (Signed)
[name]'s HIV appears adequately controlled with a viral load that is undetectable and a CD4 count of 840.  She has no problems obtaining or taking her Medications and is adherent to the medication regimen.  She has no signs/symptoms of opportunistic infection or progressive HIV disease through history or physical exam.  Prevnar updated today.  Continue current dosage of Triumeq. Bus passes provided per patient request. Plan to follow up in 6 months or sooner if needed with lab work 1-2 weeks prior to appointment.

## 2017-12-30 NOTE — Assessment & Plan Note (Signed)
Headaches appear consistent with migraine headaches as neurological exam today is normal with no significant findings.  Recommend over-the-counter medications as needed for symptom relief at this time.  If symptoms worsen will consider sumatriptan.  Advised if she experiences the worst headache of her life to seek emergency care.

## 2017-12-31 ENCOUNTER — Telehealth: Payer: Self-pay

## 2017-12-31 NOTE — Telephone Encounter (Signed)
[facility] special[facility] left a vm for our office to inform us that pt's Lasix 20 mg was not covered under HMAP and to see if an alternative could be sent into pharmacy. Will inform Marcos EkeGreg Calone, NP that medication is not covered and alternative must be sent in [facility] L Maldonado, [place]

## 2018-01-01 ENCOUNTER — Ambulatory Visit (HOSPITAL_COMMUNITY)
Admission: RE | Admit: 2018-01-01 | Discharge: 2018-01-01 | Disposition: A | Discharge status: Home / Self Care | Payer: Medicaid Other | Source: Ambulatory Visit | Attending: Family | Admitting: Family

## 2018-01-01 DIAGNOSIS — R600 Localized edema: Secondary | ICD-10-CM | POA: Insufficient documentation

## 2018-01-01 LAB — BRAIN NATRIURETIC PEPTIDE: Brain Natriuretic Peptide: <4 pg/mL

## 2018-01-01 NOTE — Progress Notes (Signed)
VASCULAR LAB PRELIMINARY  ARTERIAL  ABI completed:    RIGHT    LEFT    PRESSURE WAVEFORM  PRESSURE WAVEFORM  BRACHIAL 124 Triphasic BRACHIAL 133 Triphasic  DP 136 Triphasic DP 156 Triphasic  AT   AT    PT 149 Triphasic PT 127 Triphasic  PER   PER    GREAT TOE 107  GREAT TOE 89     RIGHT LEFT  ABI 1.12 1.17     HONGYING  Alka Falwell, RVT 01/01/2018, 11:02 AM

## 2018-01-02 ENCOUNTER — Telehealth: Payer: Self-pay | Admitting: Behavioral Health

## 2018-01-02 NOTE — Telephone Encounter (Signed)
-----   Message from Veryl SpeakGregory D Calone, FNP sent at 01/02/2018  8:11 AM EDT ----- Please inform Barbara Henry that her ultrasound and blood work look good. I would recommend that she wear compression socks to help with her lower leg swelling in addition to using the furosemide as needed. If her symptoms are not improving, please have her follow up with me in the next 2 weeks.

## 2018-01-02 NOTE — Telephone Encounter (Signed)
Called patient and informed her per Marcos EkeGreg Calone NP that her ultrasound and and lab results look good.  Recommend that she continues to wear compression socks to help with lower leg swelling and use furosemide as needed.  Let her know if she does not see any improvement in the next couple of weeks to call to make and appointment with Tammy SoursGreg.  Patient was appreciative and verbalized understanding.  Barbara SlimAshley Christianna Belmonte RN

## 2018-01-21 ENCOUNTER — Other Ambulatory Visit: Payer: Self-pay | Admitting: Infectious Disease

## 2018-01-21 ENCOUNTER — Other Ambulatory Visit: Payer: Self-pay

## 2018-01-21 DIAGNOSIS — B20 Human immunodeficiency virus [HIV] disease: Secondary | ICD-10-CM

## 2018-05-05 ENCOUNTER — Encounter: Payer: Self-pay | Admitting: Infectious Diseases

## 2018-05-15 ENCOUNTER — Encounter (INDEPENDENT_AMBULATORY_CARE_PROVIDER_SITE_OTHER): Payer: Self-pay

## 2018-05-15 DIAGNOSIS — Z006 Encounter for examination for normal comparison and control in clinical research program: Secondary | ICD-10-CM

## 2018-05-15 DIAGNOSIS — M109 Gout, unspecified: Secondary | ICD-10-CM | POA: Insufficient documentation

## 2018-05-15 HISTORY — DX: Gout, unspecified: M10.9

## 2018-05-15 LAB — BASIC METABOLIC PANEL WITH GFR
BUN: 10 mg/dL (ref 7–25)
CALCIUM: 10.2 mg/dL (ref 8.6–10.4)
CO2: 26 mmol/L (ref 20–32)
CREATININE: 1.05 mg/dL (ref 0.50–1.05)
Chloride: 103 mmol/L (ref 98–110)
GFR, EST NON AFRICAN AMERICAN: 60 mL/min/{1.73_m2} (ref >=60)
GFR, Est African American: 69 mL/min/{1.73_m2} (ref >=60)
Glucose, Bld: 86 mg/dL (ref 65–99)
POTASSIUM: 4.2 mmol/L (ref 3.5–5.3)
Sodium: 139 mmol/L (ref 135–146)

## 2018-05-15 LAB — URIC ACID: Uric Acid, Serum: 6.8 mg/dL (ref 2.5–7.0)

## 2018-05-15 MED ORDER — COLCHICINE 0.6 MG PO TABS
ORAL_TABLET | ORAL | 5 refills | Status: DC
Start: 2018-05-15 — End: 2018-06-30

## 2018-05-15 NOTE — Research (Signed)
Participant is here for study [phone]. She has had no cardiac events. She is having an issue today with gout which she says is historical since 2004. Today it is flaring in her left foot. It is swollen across the top of the foot and she is unable to walk without limping or wear her regular shoes. She states this is also interfering with her sleep. She is currently using heat and ibuprofen. Consulted with Dr. Daiva EvesVan Dam and additional BMP, GFR and uric acid were ordered and collected. Colchicine is preferred however this is not covered under [facility] pharmacy. Dr. Daiva EvesVan Dam provided course of prednisone. Patient to discuss next week when she has her appointment with Judeth CornfieldStephanie, probenecid is covered under [facility], may also need to investigate pharmaceutical assistance for colchicine. Patient is taking lasix, she states that she was prescribed the medication for ear and sinus infection, patient unclear about rationale for use. When questioned about lower extremity swelling, blood pressure concerns or shortness of breath she denied. Dr. Daiva EvesVan Dam requested she stop the lasix as this might be exacerbating her gout. She voiced understanding. She has excellent adherence to her ART and study medication. She is scheduled for next study evaluation on 09/11/18.

## 2018-05-18 NOTE — Progress Notes (Signed)
      Subjective:    Barbara Henry is a 57 y.o. female here for an annual pelvic exam and pap smear. Last study was done in 12-2016 with previous studies normal.   Review of Systems: Current GYN complaints or concerns: gout flare of the left foot. Has not picked up prednisone and does not know she was supposed to. Not sexually active. (+)Breast cancer history in sister and aunt. Needs mammogram updated. Menopause for at least 7 years. .  Patient denies any abdominal/pelvic pain, problems with bowel movements, urination, vaginal discharge or intercourse.   Past Medical History:  Diagnosis Date  . Anemia   . Gout 05/15/2018  . Gout attack 02/22/2016  . Hepatitis C without hepatic coma   . History of hepatitis C 02/01/2015  . HIV (human immunodeficiency virus infection) (HCC)   . HTN (hypertension) 02/19/2017  . HTN, white coat 02/01/2015  . Shingles     Gynecologic History: No obstetric history on file.  Patient's last menstrual period was 03/15/2013. Contraception: post menopausal status Last Pap: 12/2016. Results were: normal Anal Intercourse: no Last Mammogram: 2010. Results were: normal  Objective:  Physical Exam  Constitutional: Well developed, well nourished, no acute distress. She is alert and oriented x3.  Pelvic: External genitalia is normal in appearance. The vagina is normal in appearance. The cervix is bulbous and easily visualized. No CMT, normal expected cervical mucus present. Bimanual exam reveals uterus that is felt to be normal size, shape, and contour. No adnexal masses or tenderness noted.  Breasts: symmetrical in contour, shape and texture. Psych: She has a normal mood and affect.   Musk: LLE swollen, painful and tender over ankle. Requires cane for ambulation assistance. Left great toenail long and painful - no infection present; appears ingrown.   Assessment:  Cervical/Breast cancer screening, Woman's Health Exam = Normal pelvic and bimanual exam. Thin prep  pap was obtained and sent for cytology with reflex HPV and GC/C today. Normal clinical breast exam. Mammogram referral placed with scholarship application completed today.   Gout flare = she has stopped her lasix as we asked however not picked up prednisone.   Plan:  Health Maintenance =   Return in 3 years for annual pap screening unless indicated sooner. Discussed recommended screening interval for women living with HIV disease intended to be lifelong.   Results will be communicated to the patient via phone call.   She has been counseled and instructed how to perform monthly self breast exams.  Screening mammogram to be scheduled.   Contraception / Family Planning =   Post-menopausal   Acute Gout Flare =   Will send in prednisone for 40 mg QD x 5d then taper for 15 day course with initiation of probenacid BID on January 1st.     HIV =   She will continue her HAART and F/U as scheduled with ACTG team and Dr. Daiva EvesVan Dam for ongoing HIV care.   Rexene AlbertsStephanie Dickie Labarre, MSN, NP-C [facility] Office: [phone] Pager: [phone]  05/18/18 11:11 PM

## 2018-05-19 ENCOUNTER — Telehealth: Payer: Self-pay

## 2018-05-19 ENCOUNTER — Encounter: Payer: Self-pay | Admitting: Infectious Diseases

## 2018-05-19 ENCOUNTER — Ambulatory Visit (INDEPENDENT_AMBULATORY_CARE_PROVIDER_SITE_OTHER): Payer: Medicaid Other | Admitting: Infectious Diseases

## 2018-05-19 DIAGNOSIS — Z124 Encounter for screening for malignant neoplasm of cervix: Secondary | ICD-10-CM

## 2018-05-19 DIAGNOSIS — M79675 Pain in left toe(s): Secondary | ICD-10-CM

## 2018-05-19 DIAGNOSIS — M109 Gout, unspecified: Secondary | ICD-10-CM

## 2018-05-19 DIAGNOSIS — Z7982 Long term (current) use of aspirin: Secondary | ICD-10-CM

## 2018-05-19 DIAGNOSIS — Z1239 Encounter for other screening for malignant neoplasm of breast: Secondary | ICD-10-CM

## 2018-05-19 DIAGNOSIS — Z7952 Long term (current) use of systemic steroids: Secondary | ICD-10-CM

## 2018-05-19 MED ORDER — PROBENECID 500 MG PO TABS: 500.0000 mg | ORAL_TABLET | Freq: Two times a day (BID) | ORAL | 3 refills | Status: DC

## 2018-05-19 MED ORDER — PREDNISONE 10 MG PO TABS: ORAL_TABLET | ORAL | 0 refills | Status: AC

## 2018-05-19 NOTE — Patient Instructions (Addendum)
For your gout pain -  please start taking Prednisone -  Start taking 4 tablets once a day with food for 5 days On December 22nd please take 2 pills once a day with food On December 27th please take 1 pill once a day with food until gone   On January 1st please start taking your Probenacid one pill twice a day to prevent further flares of your gout. Do not take with your aspirin please - separate by 6 hours.   Will call you with your pap smear results - if this is normal we can check your next one in 3 years.   Mammogram to be scheduled.  [facility] [facility].  [phone] [address], [address] [place], [place] [address]

## 2018-05-19 NOTE — Telephone Encounter (Signed)
Per Rexene AlbertsStephanie Dixon, NP Faxed Script with transmission [facility] to [facility] [address] on 05/19/18  At 2:21 pm.   Script for predniSone 10 mg Take 4 tablets by mouth daily with breakfast for 5 days, then 2 tablets daily with breakfast for 5 days, then 2 tablets daily with breakfast for 5 days. Qty:40  Refills:0

## 2018-05-22 ENCOUNTER — Other Ambulatory Visit: Payer: Self-pay | Admitting: Infectious Disease

## 2018-05-22 ENCOUNTER — Telehealth: Payer: Self-pay

## 2018-05-22 DIAGNOSIS — B20 Human immunodeficiency virus [HIV] disease: Secondary | ICD-10-CM

## 2018-05-22 LAB — CYTOLOGY - PAP
Chlamydia: NEGATIVE
Diagnosis: NEGATIVE
HPV (WINDOPATH): NOT DETECTED
Neisseria Gonorrhea: NEGATIVE

## 2018-05-22 NOTE — Telephone Encounter (Signed)
Per Judeth CornfieldStephanie called patient to inform her of normal pap smear and HPV results. Spoke to OxfordRobin, and is content that her next Pap will be in 3 years.

## 2018-05-22 NOTE — Telephone Encounter (Signed)
-----   Message from Blanchard KelchStephanie N Dixon, NP sent at 05/22/2018  4:14 PM EST ----- Please call Barbara Henry to inform her that her pap smear is normal--this means that there is no concern for cancerous or pre-cancerous cells. Her HPV test was negative also (this is the virus that is responsible for 95% of cervical cancers). We can space your next test out 3 years (05-2021).

## 2018-05-22 NOTE — Progress Notes (Signed)
Please call Zella BallRobin to inform her that her pap smear is normal--this means that there is no concern for cancerous or pre-cancerous cells. Her HPV test was negative also (this is the virus that is responsible for 95% of cervical cancers). We can space your next test out 3 years (05-2021).

## 2018-06-01 ENCOUNTER — Other Ambulatory Visit (HOSPITAL_COMMUNITY): Payer: Self-pay | Admitting: *Deleted

## 2018-06-01 DIAGNOSIS — N6320 Unspecified lump in the left breast, unspecified quadrant: Secondary | ICD-10-CM

## 2018-06-05 ENCOUNTER — Ambulatory Visit: Payer: Medicaid Other | Admitting: Podiatry

## 2018-06-08 ENCOUNTER — Other Ambulatory Visit: Payer: Medicaid Other

## 2018-06-12 ENCOUNTER — Ambulatory Visit: Payer: Medicaid Other

## 2018-06-12 ENCOUNTER — Encounter: Payer: Self-pay | Admitting: Podiatry

## 2018-06-12 DIAGNOSIS — M778 Other enthesopathies, not elsewhere classified: Secondary | ICD-10-CM

## 2018-06-12 DIAGNOSIS — M779 Enthesopathy, unspecified: Principal | ICD-10-CM

## 2018-06-12 NOTE — Progress Notes (Signed)
This encounter was created in error - please disregard.

## 2018-06-15 ENCOUNTER — Other Ambulatory Visit: Payer: Medicaid Other

## 2018-06-15 DIAGNOSIS — B20 Human immunodeficiency virus [HIV] disease: Secondary | ICD-10-CM

## 2018-06-16 LAB — T-HELPER CELL (CD4) - (RCID CLINIC ONLY)
CD4 T CELL ABS: 630 /uL (ref 400–2700)
CD4 T CELL HELPER: 35 % (ref 33–55)

## 2018-06-17 LAB — LIPID PANEL
CHOL/HDL RATIO: 2.6 (calc) (ref <5.0)
Cholesterol: 197 mg/dL (ref <200)
HDL: 75 mg/dL (ref >50)
LDL CHOLESTEROL (CALC): 102 mg/dL — AB
NON-HDL CHOLESTEROL (CALC): 122 mg/dL (ref <130)
TRIGLYCERIDES: 107 mg/dL (ref <150)

## 2018-06-17 LAB — COMPREHENSIVE METABOLIC PANEL
AG Ratio: 1.6 (calc) (ref 1.0–2.5)
ALKALINE PHOSPHATASE (APISO): 102 U/L (ref 33–130)
ALT: 15 U/L (ref 6–29)
AST: 15 U/L (ref 10–35)
Albumin: 4.3 g/dL (ref 3.6–5.1)
BUN/Creatinine Ratio: 12 (calc) (ref 6–22)
BUN: 13 mg/dL (ref 7–25)
CALCIUM: 10 mg/dL (ref 8.6–10.4)
CO2: 25 mmol/L (ref 20–32)
CREATININE: 1.1 mg/dL — AB (ref 0.50–1.05)
Chloride: 103 mmol/L (ref 98–110)
GLUCOSE: 80 mg/dL (ref 65–99)
Globulin: 2.7 g/dL (calc) (ref 1.9–3.7)
Potassium: 3.8 mmol/L (ref 3.5–5.3)
Sodium: 141 mmol/L (ref 135–146)
Total Bilirubin: 0.4 mg/dL (ref 0.2–1.2)
Total Protein: 7 g/dL (ref 6.1–8.1)

## 2018-06-17 LAB — CBC
HCT: 36 % (ref 35.0–45.0)
HEMOGLOBIN: 12.4 g/dL (ref 11.7–15.5)
MCH: 31.9 pg (ref 27.0–33.0)
MCHC: 34.4 g/dL (ref 32.0–36.0)
MCV: 92.5 fL (ref 80.0–100.0)
MPV: 10.3 fL (ref 7.5–12.5)
Platelets: 182 10*3/uL (ref 140–400)
RBC: 3.89 10*6/uL (ref 3.80–5.10)
RDW: 13.5 % (ref 11.0–15.0)
WBC: 4.3 10*3/uL (ref 3.8–10.8)

## 2018-06-17 LAB — HIV-1 RNA QUANT-NO REFLEX-BLD
HIV 1 RNA Quant: <20 copies/mL — AB
HIV-1 RNA Quant, Log: <1.3 Log copies/mL — AB

## 2018-06-17 LAB — RPR: RPR Ser Ql: NONREACTIVE

## 2018-06-21 ENCOUNTER — Other Ambulatory Visit: Payer: Self-pay | Admitting: Infectious Diseases

## 2018-06-25 ENCOUNTER — Other Ambulatory Visit: Payer: Medicaid Other

## 2018-06-25 ENCOUNTER — Ambulatory Visit (HOSPITAL_COMMUNITY): Payer: Medicaid Other

## 2018-06-26 ENCOUNTER — Ambulatory Visit: Payer: Medicaid Other

## 2018-06-30 ENCOUNTER — Ambulatory Visit (INDEPENDENT_AMBULATORY_CARE_PROVIDER_SITE_OTHER): Payer: Medicaid Other | Admitting: Infectious Disease

## 2018-06-30 ENCOUNTER — Encounter: Payer: Self-pay | Admitting: Infectious Disease

## 2018-06-30 VITALS — BP 117/81 | HR 92 | Temp 98.2°F | Wt 207.0 lb

## 2018-06-30 DIAGNOSIS — I10 Essential (primary) hypertension: Secondary | ICD-10-CM

## 2018-06-30 DIAGNOSIS — M109 Gout, unspecified: Secondary | ICD-10-CM

## 2018-06-30 DIAGNOSIS — Z23 Encounter for immunization: Secondary | ICD-10-CM

## 2018-06-30 DIAGNOSIS — R600 Localized edema: Secondary | ICD-10-CM

## 2018-06-30 DIAGNOSIS — B20 Human immunodeficiency virus [HIV] disease: Secondary | ICD-10-CM

## 2018-06-30 DIAGNOSIS — Z79899 Other long term (current) drug therapy: Secondary | ICD-10-CM

## 2018-06-30 DIAGNOSIS — Z8619 Personal history of other infectious and parasitic diseases: Secondary | ICD-10-CM

## 2018-06-30 MED ORDER — COLCHICINE 0.6 MG PO TABS: ORAL_TABLET | ORAL | 5 refills | Status: DC

## 2018-06-30 MED ORDER — ABACAVIR-DOLUTEGRAVIR-LAMIVUD 600-50-300 MG PO TABS: 1.0000 | ORAL_TABLET | Freq: Every day | ORAL | 11 refills | Status: DC

## 2018-06-30 MED ORDER — PROBENECID 500 MG PO TABS
500.0000 mg | ORAL_TABLET | Freq: Two times a day (BID) | ORAL | 3 refills | Status: DC
Start: 2018-06-30 — End: 2018-10-07

## 2018-06-30 NOTE — Progress Notes (Signed)
Chief complaint: For HIV disease  Subjective:     Patient ID: Barbara Henry, female    DOB: Jul 28, 1960, 58 y.o.   MRN: [id number]  HPI  58 year old female who is doing quite well on her current regimen TRIUMEQ with undetectable R load and healthy CD4 count. She has cured her hepatitis C with HARVONI.  She has had problems with lower extremity edema and has been on furosemide for this.  She also has had gout attacks though she is currently out of her colchicine and probenecid.  She does not have a primary care physician.  Otherwise she is doing relatively well.      Past Medical History:  Diagnosis Date  . Anemia   . Gout 05/15/2018  . Gout attack 02/22/2016  . Hepatitis C without hepatic coma   . History of hepatitis C 02/01/2015  . HIV (human immunodeficiency virus infection) (HCC)   . HTN (hypertension) 02/19/2017  . HTN, white coat 02/01/2015  . Shingles     No past surgical history on file.  No family history on file.    Social History   Socioeconomic History  . Marital status: Single    Spouse name: Not on file  . Number of children: Not on file  . Years of education: Not on file  . Highest education level: Not on file  Occupational History  . Not on file  Social Needs  . Financial resource strain: Not on file  . Food insecurity:    Worry: Not on file    Inability: Not on file  . Transportation needs:    Medical: Not on file    Non-medical: Not on file  Tobacco Use  . Smoking status: Never Smoker  . Smokeless tobacco: Never Used  Substance and Sexual Activity  . Alcohol use: Yes    Alcohol/week: 2.0 standard drinks    Types: 2 drink(s) per week    Comment: beer  . Drug use: No  . Sexual activity: Yes    Partners: Male    Birth control/protection: Condom    Comment: declined condoms, last sexual encounter 03/2013  Lifestyle  . Physical activity:    Days per week: Not on file    Minutes per session: Not on file  . Stress: Not on file   Relationships  . Social connections:    Talks on phone: Not on file    Gets together: Not on file    Attends religious service: Not on file    Active member of club or organization: Not on file    Attends meetings of clubs or organizations: Not on file    Relationship status: Not on file  Other Topics Concern  . Not on file  Social History Narrative  . Not on file    No Known Allergies   Current Outpatient Medications:  .  abacavir-dolutegravir-lamiVUDine (TRIUMEQ) 600-50-300 MG tablet, Take 1 tablet by mouth daily., Disp: 30 tablet, Rfl: 11 .  aspirin 325 MG tablet, Take 325 mg by mouth once.  , Disp: , Rfl:  .  colchicine 0.6 MG tablet, With gout flare take 1 tab q 8 hrs on day one, then take 1 tab TWICE DAILY until see Dr. Daiva EvesVan Dam again, Disp: 64 tablet, Rfl: 5 .  furosemide (LASIX) 20 MG tablet, Take 0.5 tablets (10 mg total) by mouth daily as needed., Disp: 30 tablet, Rfl: 0 .  probenecid (BENEMID) 500 MG tablet, Take 1 tablet (500 mg total) by mouth 2 (  two) times daily., Disp: 60 tablet, Rfl: 3   Review of Systems  Constitutional: Negative for activity change, appetite change, chills, diaphoresis, fatigue, fever and unexpected weight change.  HENT: Negative for congestion, postnasal drip, rhinorrhea, sinus pressure, sneezing and trouble swallowing.   Eyes: Negative for photophobia and visual disturbance.  Respiratory: Negative for chest tightness, shortness of breath, wheezing and stridor.   Cardiovascular: Negative for chest pain, palpitations and leg swelling.  Gastrointestinal: Negative for abdominal distention, anal bleeding, blood in stool, constipation and nausea.  Genitourinary: Negative for dysuria and hematuria.  Musculoskeletal: Negative for arthralgias, back pain, gait problem, joint swelling and myalgias.  Skin: Negative for color change, pallor and wound.  Neurological: Negative for dizziness, tremors, weakness and light-headedness.  Hematological: Negative  for adenopathy. Does not bruise/bleed easily.  Psychiatric/Behavioral: Negative for agitation, behavioral problems, confusion, decreased concentration, dysphoric mood, hallucinations and sleep disturbance.       Objective:   Physical Exam  Constitutional: She is oriented to person, place, and time. She appears well-developed and well-nourished. No distress.  HENT:  Head: Normocephalic and atraumatic.  Mouth/Throat: Oropharynx is clear and moist. No oropharyngeal exudate.  Eyes: Conjunctivae and EOM are normal. No scleral icterus.  Neck: Normal range of motion. Neck supple.  Cardiovascular: Normal rate and regular rhythm.  Pulmonary/Chest: Effort normal. No respiratory distress. She has no wheezes.  Abdominal: She exhibits no distension. There is no abdominal tenderness.  Musculoskeletal:        General: Edema present. No tenderness.  Neurological: She is alert and oriented to person, place, and time. She exhibits normal muscle tone. Coordination normal.  Skin: Skin is warm and dry. No rash noted. She is not diaphoretic. No erythema.  Psychiatric: She has a normal mood and affect. Her behavior is normal. Judgment and thought content normal.   Macular rash on neck     Assessment & Plan:    HIV: continue TRIUMEQ, perfectly  Suppressed renewed HMAP  Gout : Start her colchicine  HX OF Hep C:  SP  HARVONI. CURED Vitals:   06/30/18 1013  BP: 117/81  Pulse: 92  Temp: 98.2 F (36.8 C)    Extremity edema: She is on Lasix which may be helping with this but is also probably exacerbating gout at times.  If the gout becomes more of a significant problem may consider just getting rid of her furosemide she really needs to have a primary care physician as well.

## 2018-07-07 ENCOUNTER — Encounter: Payer: Self-pay | Admitting: Infectious Disease

## 2018-08-06 ENCOUNTER — Other Ambulatory Visit: Payer: Medicaid Other

## 2018-08-06 ENCOUNTER — Ambulatory Visit (HOSPITAL_COMMUNITY): Payer: Medicaid Other

## 2018-08-31 ENCOUNTER — Telehealth: Payer: Self-pay

## 2018-08-31 NOTE — Telephone Encounter (Signed)
Barbara Henry completed her month 79 Reprieve visit via telephone. She is taking her study medication and Triumeq daily. She reports no new muscle aches, muscle weakness, or medication changes. She recently experienced a gout flare up in her R big toe, unrelated to study medication. She will return for her month 56 visit on 8/14.

## 2018-10-06 ENCOUNTER — Other Ambulatory Visit: Payer: Self-pay | Admitting: Infectious Diseases

## 2018-11-06 ENCOUNTER — Other Ambulatory Visit (HOSPITAL_COMMUNITY): Payer: Self-pay | Admitting: *Deleted

## 2018-11-06 DIAGNOSIS — N6320 Unspecified lump in the left breast, unspecified quadrant: Secondary | ICD-10-CM

## 2018-11-10 ENCOUNTER — Encounter (HOSPITAL_COMMUNITY): Payer: Self-pay

## 2018-11-10 ENCOUNTER — Other Ambulatory Visit: Payer: Self-pay

## 2018-11-10 ENCOUNTER — Ambulatory Visit (HOSPITAL_COMMUNITY)
Admission: RE | Admit: 2018-11-10 | Discharge: 2018-11-10 | Disposition: A | Discharge status: Home / Self Care | Payer: Medicaid Other | Source: Ambulatory Visit | Attending: Obstetrics and Gynecology | Admitting: Obstetrics and Gynecology

## 2018-11-10 ENCOUNTER — Ambulatory Visit
Admission: RE | Admit: 2018-11-10 | Discharge: 2018-11-10 | Disposition: A | Discharge status: Home / Self Care | Payer: Self-pay | Source: Ambulatory Visit | Attending: Obstetrics and Gynecology | Admitting: Obstetrics and Gynecology

## 2018-11-10 ENCOUNTER — Other Ambulatory Visit (HOSPITAL_COMMUNITY): Payer: Self-pay | Admitting: Obstetrics and Gynecology

## 2018-11-10 ENCOUNTER — Ambulatory Visit: Payer: Medicaid Other

## 2018-11-10 VITALS — BP 122/80 | Temp 97.3°F | Ht 67.0 in | Wt 212.0 lb

## 2018-11-10 DIAGNOSIS — N6320 Unspecified lump in the left breast, unspecified quadrant: Secondary | ICD-10-CM

## 2018-11-10 DIAGNOSIS — Z1239 Encounter for other screening for malignant neoplasm of breast: Secondary | ICD-10-CM

## 2018-11-10 NOTE — Addendum Note (Signed)
Encounter addended by: Loletta Parish, RN on: 11/10/2018 4:09 PM  Actions taken: Clinical Note Signed

## 2018-11-10 NOTE — Patient Instructions (Signed)
Explained breast self awareness with [name]. Patient did not need a Pap smear today due to last Pap smear and HPV typing was 05/19/2018. Patient is followed by [facility] for Pap smears. Referred patient to the [facility] for a diagnostic mammogram and possible left breast ultrasound per recommendation. Appointment scheduled for Tuesday, November 10, 2018 at 1050. Patient aware of appointment and will be there. [name] verbalized understanding.  [place]annock, [name], RN 10:15 AM

## 2018-11-10 NOTE — Progress Notes (Addendum)
Patient referred to [facility] due to left breast diagnostic mammogram recommended in 41-months. Last diagnostic mammogram completed 11/03/2008.   Pap Smear: Pap smear not completed today. Last Pap smear was 05/19/2018 at [facility] and normal with negative HPV. Per patient has a history of two abnormal Pap smears 06/18/2013 that was ACUS HPV negative and 01/12/2016 that was ASCUS HPV negative with an atrophic pattern of epithelial atypia. Patient had follow-up Pap smears completed. Last four Pap smear results are in Epic.  Physical exam: Breasts Breasts symmetrical. No skin abnormalities bilateral breasts. No nipple retraction bilateral breasts. No nipple discharge bilateral breasts. No lymphadenopathy. No lumps palpated bilateral breasts. No complaints of pain or tenderness on exam. Referred patient to the [facility] for a diagnostic mammogram and possible left breast ultrasound per recommendation. Appointment scheduled for Tuesday, November 10, 2018 at 1050.        Pelvic/Bimanual No Pap smear completed today since last Pap smear and HPV typing was 05/19/2018. Pap smear not indicated per [facility] guidelines.   Smoking History: Patient has never smoked.  Patient Navigation: Patient education provided. Access to services provided for patient through [place] program.   Colorectal Cancer Screening: Per patient has never had a colonoscopy completed. No complaints today.  Breast and Cervical Cancer Risk Assessment: Patient has no family history of breast cancer, known genetic mutations, or radiation treatment to the chest before age 45. Patient has no history of cervical dysplasia. Patient has a history of HIV. Patient has no history of DES exposure in-utero.  Risk Assessment    Risk Scores      11/10/2018   Last edited by: Armond Hang, LPN   5-year risk: 1.1 %   Lifetime risk: 5.9 %

## 2018-11-11 ENCOUNTER — Encounter (HOSPITAL_COMMUNITY): Payer: Self-pay | Admitting: *Deleted

## 2018-12-29 ENCOUNTER — Other Ambulatory Visit: Payer: Medicaid Other

## 2018-12-29 ENCOUNTER — Ambulatory Visit: Payer: Medicaid Other

## 2019-01-18 ENCOUNTER — Encounter: Payer: Self-pay | Admitting: *Deleted

## 2019-01-18 ENCOUNTER — Other Ambulatory Visit: Payer: Self-pay

## 2019-01-18 ENCOUNTER — Encounter: Payer: Self-pay | Admitting: Infectious Disease

## 2019-01-18 ENCOUNTER — Ambulatory Visit (INDEPENDENT_AMBULATORY_CARE_PROVIDER_SITE_OTHER): Payer: Self-pay | Admitting: Infectious Disease

## 2019-01-18 VITALS — BP 118/77 | HR 65 | Temp 97.8°F | Wt 217.2 lb

## 2019-01-18 VITALS — BP 118/77 | HR 65 | Temp 97.8°F

## 2019-01-18 DIAGNOSIS — B20 Human immunodeficiency virus [HIV] disease: Secondary | ICD-10-CM

## 2019-01-18 DIAGNOSIS — I10 Essential (primary) hypertension: Secondary | ICD-10-CM

## 2019-01-18 DIAGNOSIS — G4489 Other headache syndrome: Secondary | ICD-10-CM

## 2019-01-18 DIAGNOSIS — Z006 Encounter for examination for normal comparison and control in clinical research program: Secondary | ICD-10-CM

## 2019-01-18 NOTE — Research (Signed)
Barbara Henry is here for her month 77 Reprieve visit. She had a clinic visit with Dr. Tommy Medal today as well. No new complaint or medications. Verbalized excellent adherence with her medications. Denied any muscle aches or weakness. She will return in December for her next study visit.

## 2019-01-18 NOTE — Progress Notes (Signed)
Chief complaint: For HIV disease complaining of nighttime headaches  Subjective:     Patient ID: Barbara Henry, female    DOB: 01-09-1961, 58 y.o.   MRN: [id number]  HPI  58 year old female who is doing quite well on her current regimen TRIUMEQ with undetectable R load and healthy CD4 count. She has cured her hepatitis C with HARVONI.  She is complaining of some headaches that are worse at night.  She sometimes notices them in the morning but they typically respond to small dose of aspirin.  This is been going on for the last month.  There is not any associated aura or's sensitivity to light or nausea or vomiting.  Blood pressure is well controlled based on today's labs.       Past Medical History:  Diagnosis Date  . Anemia   . Gout 05/15/2018  . Gout attack 02/22/2016  . Hepatitis C without hepatic coma   . History of hepatitis C 02/01/2015  . HIV (human immunodeficiency virus infection) (HCC)   . HTN (hypertension) 02/19/2017  . HTN, white coat 02/01/2015  . Shingles     No past surgical history on file.  Family History  Problem Relation Age of Onset  . Diabetes Sister   . Diabetes Brother   . Breast cancer Maternal Aunt 50  . Breast cancer Maternal Aunt [id number]      Social History   Socioeconomic History  . Marital status: Single    Spouse name: Not on file  . Number of children: 4  . Years of education: Not on file  . Highest education level: 12th grade  Occupational History  . Not on file  Social Needs  . Financial resource strain: Not on file  . Food insecurity    Worry: Not on file    Inability: Not on file  . Transportation needs    Medical: Yes    Non-medical: Yes  Tobacco Use  . Smoking status: Never Smoker  . Smokeless tobacco: Never Used  Substance and Sexual Activity  . Alcohol use: Yes    Alcohol/week: 2.0 standard drinks    Types: 2 Standard drinks or equivalent per week    Comment: beer  . Drug use: No  . Sexual activity: Yes   Partners: Male    Birth control/protection: Condom    Comment: declined condoms, last sexual encounter 03/2013  Lifestyle  . Physical activity    Days per week: Not on file    Minutes per session: Not on file  . Stress: Not on file  Relationships  . Social Musicianconnections    Talks on phone: Not on file    Gets together: Not on file    Attends religious service: Not on file    Active member of club or organization: Not on file    Attends meetings of clubs or organizations: Not on file    Relationship status: Not on file  Other Topics Concern  . Not on file  Social History Narrative  . Not on file    No Known Allergies   Current Outpatient Medications:  .  abacavir-dolutegravir-lamiVUDine (TRIUMEQ) 600-50-300 MG tablet, Take 1 tablet by mouth daily., Disp: 30 tablet, Rfl: 11 .  aspirin 325 MG tablet, Take 325 mg by mouth once.  , Disp: , Rfl:  .  colchicine 0.6 MG tablet, With gout flare take 1 tab q 8 hrs on day one, then take 1 tab TWICE DAILY until see Dr. Daiva EvesVan Dam again, Disp:  64 tablet, Rfl: 5 .  furosemide (LASIX) 20 MG tablet, Take 0.5 tablets (10 mg total) by mouth daily as needed., Disp: 30 tablet, Rfl: 0 .  probenecid (BENEMID) 500 MG tablet, TAKE 1 TABLET(500 MG) BY MOUTH TWICE DAILY (Patient not taking: Reported on 01/18/2019), Disp: 60 tablet, Rfl: 3   Review of Systems  Constitutional: Negative for activity change, appetite change, chills, diaphoresis, fatigue, fever and unexpected weight change.  HENT: Negative for congestion, postnasal drip, rhinorrhea, sinus pressure, sneezing, sore throat and trouble swallowing.   Eyes: Negative for photophobia and visual disturbance.  Respiratory: Negative for cough, chest tightness, shortness of breath, wheezing and stridor.   Cardiovascular: Negative for chest pain, palpitations and leg swelling.  Gastrointestinal: Negative for abdominal distention, abdominal pain, anal bleeding, blood in stool, constipation, diarrhea, nausea and  vomiting.  Genitourinary: Negative for difficulty urinating, dysuria, flank pain and hematuria.  Musculoskeletal: Negative for arthralgias, back pain, gait problem, joint swelling and myalgias.  Skin: Negative for color change, pallor, rash and wound.  Neurological: Positive for headaches. Negative for dizziness, tremors, weakness and light-headedness.  Hematological: Negative for adenopathy. Does not bruise/bleed easily.  Psychiatric/Behavioral: Negative for agitation, behavioral problems, confusion, decreased concentration, dysphoric mood, hallucinations and sleep disturbance.       Objective:   Physical Exam  Constitutional: She is oriented to person, place, and time. She appears well-developed and well-nourished. No distress.  HENT:  Head: Normocephalic and atraumatic.  Mouth/Throat: Oropharynx is clear and moist. No oropharyngeal exudate.  Eyes: Conjunctivae and EOM are normal. No scleral icterus.  Neck: Normal range of motion. Neck supple.  Cardiovascular: Normal rate and regular rhythm.  Pulmonary/Chest: Effort normal. No respiratory distress. She has no wheezes.  Abdominal: She exhibits no distension. There is no abdominal tenderness.  Musculoskeletal:        General: No tenderness.  Neurological: She is alert and oriented to person, place, and time. She exhibits normal muscle tone. Coordination normal.  Skin: Skin is warm and dry. No rash noted. She is not diaphoretic. No erythema.  Psychiatric: She has a normal mood and affect. Her behavior is normal. Judgment and thought content normal.   Macular rash on neck     Assessment & Plan:    HIV: continue TRIUMEQ, perfectly  Suppressed renewed HMAP, this integrate strand transfer him at her is associate with headache but she was not having headaches before.  I will ask her to take the Triumeq in the morning to see if this has any impact on her headache pattern  Headaches: Not clearly migrainous Emina have her change around her  her timing of her antiretroviral see if this impacts at least when she has the headaches encouraged her to try over-the-counter Excedrin for the headaches.  Ultimately though to get onto different medications for headache she would need insurance or to go to a clinic that would have samples has to my knowledge none of the migraine medications are covered by the HIV medication assistance program.   Gout : Continue colchicine.  Hypertension well controlled Vitals:   01/18/19 0953  BP: 118/77  Pulse: 65  Temp: 97.8 F (36.6 C)    I spent greater than 25 minutes with the patient including greater than 50% of time in face to face counsel of the patient regarding work-up of her headaches, nature of different antiretroviral regimens and in coordination of her care.

## 2019-01-19 LAB — T-HELPER CELL (CD4) - (RCID CLINIC ONLY)
CD4 % Helper T Cell: 39 % (ref 33–65)
CD4 T Cell Abs: 559 /uL (ref 400–1790)

## 2019-01-21 LAB — CBC WITH DIFFERENTIAL/PLATELET
Absolute Monocytes: 486 cells/uL (ref 200–950)
Basophils Absolute: 30 cells/uL (ref 0–200)
Basophils Relative: 0.8 %
Eosinophils Absolute: 68 cells/uL (ref 15–500)
Eosinophils Relative: 1.8 %
HCT: 37.7 % (ref 35.0–45.0)
Hemoglobin: 12.8 g/dL (ref 11.7–15.5)
Lymphs Abs: 1376 cells/uL (ref 850–3900)
MCH: 31.8 pg (ref 27.0–33.0)
MCHC: 34 g/dL (ref 32.0–36.0)
MCV: 93.8 fL (ref 80.0–100.0)
MPV: 10.8 fL (ref 7.5–12.5)
Monocytes Relative: 12.8 %
Neutro Abs: 1839 cells/uL (ref 1500–7800)
Neutrophils Relative %: 48.4 %
Platelets: 190 10*3/uL (ref 140–400)
RBC: 4.02 10*6/uL (ref 3.80–5.10)
RDW: 13.7 % (ref 11.0–15.0)
Total Lymphocyte: 36.2 %
WBC: 3.8 10*3/uL (ref 3.8–10.8)

## 2019-01-21 LAB — COMPLETE METABOLIC PANEL WITH GFR
AG Ratio: 1.7 (calc) (ref 1.0–2.5)
ALT: 15 U/L (ref 6–29)
AST: 16 U/L (ref 10–35)
Albumin: 4.3 g/dL (ref 3.6–5.1)
Alkaline phosphatase (APISO): 104 U/L (ref 37–153)
BUN/Creatinine Ratio: 13 (calc) (ref 6–22)
BUN: 15 mg/dL (ref 7–25)
CO2: 25 mmol/L (ref 20–32)
Calcium: 9.6 mg/dL (ref 8.6–10.4)
Chloride: 108 mmol/L (ref 98–110)
Creat: 1.18 mg/dL — ABNORMAL HIGH (ref 0.50–1.05)
GFR, Est African American: 60 mL/min/{1.73_m2} (ref >=60)
GFR, Est Non African American: 52 mL/min/{1.73_m2} — ABNORMAL LOW (ref >=60)
Globulin: 2.6 g/dL (calc) (ref 1.9–3.7)
Glucose, Bld: 92 mg/dL (ref 65–99)
Potassium: 4.1 mmol/L (ref 3.5–5.3)
Sodium: 141 mmol/L (ref 135–146)
Total Bilirubin: 0.3 mg/dL (ref 0.2–1.2)
Total Protein: 6.9 g/dL (ref 6.1–8.1)

## 2019-01-21 LAB — HIV-1 RNA QUANT-NO REFLEX-BLD
HIV 1 RNA Quant: <20 copies/mL — AB
HIV-1 RNA Quant, Log: <1.3 Log copies/mL — AB

## 2019-01-21 LAB — LIPID PANEL
Cholesterol: 204 mg/dL — ABNORMAL HIGH (ref <200)
HDL: 56 mg/dL (ref >=50)
LDL Cholesterol (Calc): 116 mg/dL (calc) — ABNORMAL HIGH
Non-HDL Cholesterol (Calc): 148 mg/dL (calc) — ABNORMAL HIGH (ref <130)
Total CHOL/HDL Ratio: 3.6 (calc) (ref <5.0)
Triglycerides: 200 mg/dL — ABNORMAL HIGH (ref <150)

## 2019-01-21 LAB — RPR: RPR Ser Ql: NONREACTIVE

## 2019-02-02 ENCOUNTER — Other Ambulatory Visit: Payer: Self-pay | Admitting: Infectious Disease

## 2019-02-03 ENCOUNTER — Encounter: Payer: Self-pay | Admitting: Infectious Disease

## 2019-02-03 ENCOUNTER — Telehealth: Payer: Self-pay | Admitting: *Deleted

## 2019-02-03 NOTE — Telephone Encounter (Signed)
Received refill request for probenecid.  Patient stated she was not taking this at her last appointment in August. Dr Tommy Medal asked her to follow up with her primary care doctor January 2020 re: gout flares. Patient now has Dr Tommy Medal listed as primary care. Please advise on probenecid refill. Landis Gandy, RN

## 2019-02-03 NOTE — Telephone Encounter (Signed)
I did not recall rx this and she should establish w PCP

## 2019-02-03 NOTE — Telephone Encounter (Signed)
You had been filling this previously.  OK to give 30 day supply with note for patient to establish with primary care for further management?

## 2019-02-04 NOTE — Telephone Encounter (Signed)
If I have been filling it that's OK to fill 30 days with 4 refills and give her a bit of time to find primary

## 2019-05-12 ENCOUNTER — Encounter (INDEPENDENT_AMBULATORY_CARE_PROVIDER_SITE_OTHER): Payer: Self-pay | Admitting: *Deleted

## 2019-05-12 ENCOUNTER — Other Ambulatory Visit: Payer: Self-pay

## 2019-05-12 DIAGNOSIS — Z23 Encounter for immunization: Secondary | ICD-10-CM

## 2019-05-12 DIAGNOSIS — Z006 Encounter for examination for normal comparison and control in clinical research program: Secondary | ICD-10-CM

## 2019-05-12 NOTE — Research (Signed)
Barbara Henry was here today for her month 18 visit for Reprieve, A Randomized Trial to Prevent Vascular Events in HIV (study drug is Pitavastatin 4mg  or placebo). She is c/o lower left back/buttock pain that is sharp and comes and goes, increases with standing that has been bothering her since October. I told her to try ibuprofen and heat which she says has been helping. She says her adherence with all her meds is excellent. She is due back to see Dr. Tommy Medal in January.

## 2019-05-27 ENCOUNTER — Other Ambulatory Visit: Payer: Self-pay | Admitting: Infectious Disease

## 2019-05-27 DIAGNOSIS — B20 Human immunodeficiency virus [HIV] disease: Secondary | ICD-10-CM

## 2019-05-31 ENCOUNTER — Other Ambulatory Visit: Payer: Self-pay

## 2019-06-02 NOTE — Addendum Note (Signed)
Addended by: Zoriana Oats D on: 06/02/2019 03:39 PM   Modules accepted: Orders  

## 2019-06-02 NOTE — Addendum Note (Signed)
Addended by: Dolan Amen D on: 06/02/2019 03:39 PM   Modules accepted: Orders

## 2019-06-07 ENCOUNTER — Other Ambulatory Visit: Payer: Self-pay

## 2019-06-07 DIAGNOSIS — B20 Human immunodeficiency virus [HIV] disease: Secondary | ICD-10-CM

## 2019-06-08 LAB — T-HELPER CELL (CD4) - (RCID CLINIC ONLY)
CD4 % Helper T Cell: 40 % (ref 33–65)
CD4 T Cell Abs: 572 /uL (ref 400–1790)

## 2019-06-09 ENCOUNTER — Ambulatory Visit: Payer: No Typology Code available for payment source

## 2019-06-11 LAB — CBC WITH DIFFERENTIAL/PLATELET
Absolute Monocytes: 837 cells/uL (ref 200–950)
Basophils Absolute: 19 cells/uL (ref 0–200)
Basophils Relative: 0.4 %
Eosinophils Absolute: 42 cells/uL (ref 15–500)
Eosinophils Relative: 0.9 %
HCT: 36.9 % (ref 35.0–45.0)
Hemoglobin: 12.8 g/dL (ref 11.7–15.5)
Lymphs Abs: 1565 cells/uL (ref 850–3900)
MCH: 32.2 pg (ref 27.0–33.0)
MCHC: 34.7 g/dL (ref 32.0–36.0)
MCV: 92.7 fL (ref 80.0–100.0)
MPV: 10.6 fL (ref 7.5–12.5)
Monocytes Relative: 17.8 %
Neutro Abs: 2237 cells/uL (ref 1500–7800)
Neutrophils Relative %: 47.6 %
Platelets: 179 10*3/uL (ref 140–400)
RBC: 3.98 10*6/uL (ref 3.80–5.10)
RDW: 12.8 % (ref 11.0–15.0)
Total Lymphocyte: 33.3 %
WBC: 4.7 10*3/uL (ref 3.8–10.8)

## 2019-06-11 LAB — COMPLETE METABOLIC PANEL WITH GFR
AG Ratio: 1.3 (calc) (ref 1.0–2.5)
ALT: 13 U/L (ref 6–29)
AST: 14 U/L (ref 10–35)
Albumin: 4.1 g/dL (ref 3.6–5.1)
Alkaline phosphatase (APISO): 109 U/L (ref 37–153)
BUN/Creatinine Ratio: 10 (calc) (ref 6–22)
BUN: 13 mg/dL (ref 7–25)
CO2: 26 mmol/L (ref 20–32)
Calcium: 9.4 mg/dL (ref 8.6–10.4)
Chloride: 105 mmol/L (ref 98–110)
Creat: 1.24 mg/dL — ABNORMAL HIGH (ref 0.50–1.05)
GFR, Est African American: 56 mL/min/{1.73_m2} — ABNORMAL LOW (ref >=60)
GFR, Est Non African American: 49 mL/min/{1.73_m2} — ABNORMAL LOW (ref >=60)
Globulin: 3.1 g/dL (calc) (ref 1.9–3.7)
Glucose, Bld: 88 mg/dL (ref 65–99)
Potassium: 4.1 mmol/L (ref 3.5–5.3)
Sodium: 140 mmol/L (ref 135–146)
Total Bilirubin: 0.3 mg/dL (ref 0.2–1.2)
Total Protein: 7.2 g/dL (ref 6.1–8.1)

## 2019-06-11 LAB — LIPID PANEL
Cholesterol: 177 mg/dL (ref <200)
HDL: 56 mg/dL (ref >=50)
LDL Cholesterol (Calc): 99 mg/dL (calc)
Non-HDL Cholesterol (Calc): 121 mg/dL (calc) (ref <130)
Total CHOL/HDL Ratio: 3.2 (calc) (ref <5.0)
Triglycerides: 121 mg/dL (ref <150)

## 2019-06-11 LAB — HIV-1 RNA QUANT-NO REFLEX-BLD
HIV 1 RNA Quant: 28 copies/mL — ABNORMAL HIGH
HIV-1 RNA Quant, Log: 1.45 Log copies/mL — ABNORMAL HIGH

## 2019-06-11 LAB — RPR: RPR Ser Ql: NONREACTIVE

## 2019-06-21 ENCOUNTER — Encounter: Payer: Self-pay | Admitting: Infectious Disease

## 2019-07-27 ENCOUNTER — Ambulatory Visit (INDEPENDENT_AMBULATORY_CARE_PROVIDER_SITE_OTHER): Payer: Self-pay | Admitting: Infectious Disease

## 2019-07-27 ENCOUNTER — Encounter: Payer: Self-pay | Admitting: Infectious Disease

## 2019-07-27 ENCOUNTER — Other Ambulatory Visit: Payer: Self-pay

## 2019-07-27 VITALS — BP 136/85 | HR 72 | Temp 97.9°F | Wt 221.4 lb

## 2019-07-27 DIAGNOSIS — B20 Human immunodeficiency virus [HIV] disease: Secondary | ICD-10-CM

## 2019-07-27 DIAGNOSIS — M545 Low back pain, unspecified: Secondary | ICD-10-CM

## 2019-07-27 DIAGNOSIS — I739 Peripheral vascular disease, unspecified: Secondary | ICD-10-CM

## 2019-07-27 DIAGNOSIS — I10 Essential (primary) hypertension: Secondary | ICD-10-CM

## 2019-07-27 DIAGNOSIS — Z8619 Personal history of other infectious and parasitic diseases: Secondary | ICD-10-CM

## 2019-07-27 DIAGNOSIS — M10079 Idiopathic gout, unspecified ankle and foot: Secondary | ICD-10-CM

## 2019-07-27 HISTORY — DX: Low back pain, unspecified: M54.50

## 2019-07-27 MED ORDER — COLCHICINE 0.6 MG PO TABS: ORAL_TABLET | ORAL | 11 refills | Status: DC

## 2019-07-27 MED ORDER — TRIUMEQ 600-50-300 MG PO TABS: 1.0000 | ORAL_TABLET | Freq: Every day | ORAL | 11 refills | Status: DC
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
## 2019-08-30 ENCOUNTER — Ambulatory Visit: Payer: Medicaid Other | Admitting: Family Medicine

## 2019-09-07 ENCOUNTER — Ambulatory Visit: Payer: Self-pay | Admitting: Sports Medicine

## 2019-09-15 ENCOUNTER — Ambulatory Visit: Payer: Self-pay | Admitting: Sports Medicine

## 2019-09-20 ENCOUNTER — Encounter (INDEPENDENT_AMBULATORY_CARE_PROVIDER_SITE_OTHER): Payer: Self-pay | Admitting: *Deleted

## 2019-09-20 ENCOUNTER — Other Ambulatory Visit: Payer: Self-pay

## 2019-09-20 ENCOUNTER — Other Ambulatory Visit: Payer: Self-pay | Admitting: *Deleted

## 2019-09-20 VITALS — BP 122/79 | HR 78 | Temp 98.8°F | Wt 224.2 lb

## 2019-09-20 DIAGNOSIS — Z006 Encounter for examination for normal comparison and control in clinical research program: Secondary | ICD-10-CM

## 2019-09-20 NOTE — Addendum Note (Signed)
Addended by: Phill Myron on: 09/20/2019 02:24 PM   Modules accepted: Orders

## 2019-09-20 NOTE — Research (Signed)
Barbara Henry was here for her month 64 visit for Reprieve, A Randomized Trial to Prevent Vascular Events in HIV (study drug is Pitavastatin 4mg  or placebo). She is complaining of lower back pain and gout pain. She said she did not receive any colchicine from the pharmacy and needs it. She said she has been very adherent with her study meds. We did discuss [phone] study and informed consent was obtained. We did draw a hep B surface antibody as a prescreen for eligiblity for the [employer] study. She was last tested in 2010 and was vaccinated for Hep B after that, but we have no documentation that the vaccine developed antibodies. She will be returning in August for the next study visit.

## 2019-09-22 LAB — HEPATITIS B SURFACE ANTIBODY,QUALITATIVE: Hep B S Ab: NONREACTIVE

## 2019-09-24 ENCOUNTER — Ambulatory Visit: Payer: No Typology Code available for payment source | Admitting: Sports Medicine

## 2019-09-27 ENCOUNTER — Other Ambulatory Visit: Payer: Self-pay | Admitting: Infectious Disease

## 2019-09-27 MED ORDER — COLCHICINE 0.6 MG PO TABS: ORAL_TABLET | ORAL | 11 refills | Status: DC

## 2019-09-27 MED FILL — COLCRYS 0.6 MG TABLET: 0.6 | 29 days supply | Qty: 60 | Fill #0

## 2019-09-28 ENCOUNTER — Telehealth: Payer: Self-pay | Admitting: Pharmacy Technician

## 2019-09-28 NOTE — Telephone Encounter (Signed)
[facility] Patient Advocate Encounter  Patient is uninsured and in need of colchicine 0.6mg .  [facility] outpatient pharmacy is going to supply the medication for $9.  The patient is aware that to get refills she must call the pharmacy monthly herself.  The medication will arrive to her home either 09/29/2019 or 09/30/2019.    Netty Starring. Dimas Aguas CPhT [facility] Patient [facility] Phone: [phone] Fax:  [phone]

## 2019-09-29 ENCOUNTER — Ambulatory Visit: Payer: Self-pay | Admitting: Sports Medicine

## 2019-10-14 ENCOUNTER — Other Ambulatory Visit: Payer: Self-pay

## 2019-10-14 ENCOUNTER — Encounter (INDEPENDENT_AMBULATORY_CARE_PROVIDER_SITE_OTHER): Payer: Self-pay | Admitting: Internal Medicine

## 2019-10-14 VITALS — BP 145/80 | HR 63 | Temp 99.2°F | Wt 222.1 lb

## 2019-10-14 DIAGNOSIS — Z006 Encounter for examination for normal comparison and control in clinical research program: Secondary | ICD-10-CM

## 2019-10-14 NOTE — Research (Addendum)
Barbara Henry was here today to screen for the [id number] study, [employer]. We reviewed the consent together and she asked appropriate questions about the study and was able to verbalize the purpose of the study and her role in it. Informed consent was obtained. A PE was done by Dr. Luciana Axe at this visit. She complains of chronic back pain and gout issues. If she is eligible entry is planned for June 2nd.

## 2019-10-14 NOTE — Progress Notes (Signed)
   Subjective:    Patient ID: Barbara Henry, female    DOB: Dec 27, 1960, 59 y.o.   MRN: [id number]  HPI She is here for research follow up   Review of Systems  Musculoskeletal: Positive for back pain.  All other systems reviewed and are negative.      Objective:   Physical Exam Constitutional:      Appearance: Normal appearance.  HENT:     Nose: Nose normal.  Eyes:     General: No scleral icterus. Cardiovascular:     Rate and Rhythm: Normal rate and regular rhythm.     Pulses: Normal pulses.     Heart sounds: No murmur.  Pulmonary:     Effort: Pulmonary effort is normal. No respiratory distress.     Breath sounds: Normal breath sounds. No wheezing.  Abdominal:     General: Abdomen is flat. Bowel sounds are normal.     Palpations: Abdomen is soft.  Musculoskeletal:     Comments: 1 + pedal edema bilateral  Skin:    General: Skin is warm and dry.     Findings: No rash.  Neurological:     General: No focal deficit present.     Mental Status: She is alert.  Psychiatric:        Mood and Affect: Mood normal.           Assessment & Plan:

## 2019-10-15 LAB — COMPREHENSIVE METABOLIC PANEL
AG Ratio: 1.8 (calc) (ref 1.0–2.5)
ALT: 15 U/L (ref 6–29)
AST: 14 U/L (ref 10–35)
Albumin: 4.3 g/dL (ref 3.6–5.1)
Alkaline phosphatase (APISO): 101 U/L (ref 37–153)
BUN: 14 mg/dL (ref 7–25)
CO2: 23 mmol/L (ref 20–32)
Calcium: 9.3 mg/dL (ref 8.6–10.4)
Chloride: 108 mmol/L (ref 98–110)
Creat: 0.84 mg/dL (ref 0.50–1.05)
Globulin: 2.4 g/dL (calc) (ref 1.9–3.7)
Glucose, Bld: 99 mg/dL (ref 65–99)
Potassium: 4.1 mmol/L (ref 3.5–5.3)
Sodium: 139 mmol/L (ref 135–146)
Total Bilirubin: 0.3 mg/dL (ref 0.2–1.2)
Total Protein: 6.7 g/dL (ref 6.1–8.1)

## 2019-10-15 LAB — HIV ANTIBODY (ROUTINE TESTING W REFLEX): HIV 1&2 Ab, 4th Generation: REACTIVE — AB

## 2019-10-15 LAB — HEPATITIS B CORE ANTIBODY, TOTAL: Hep B Core Total Ab: NONREACTIVE

## 2019-10-15 LAB — PROTIME-INR
INR: 1
Prothrombin Time: 11.1 s (ref 9.0–11.5)

## 2019-10-15 LAB — CK: Total CK: 76 U/L (ref 29–143)

## 2019-10-15 LAB — HIV-1/2 AB - DIFFERENTIATION
HIV-1 antibody: POSITIVE — AB
HIV-2 Ab: NEGATIVE

## 2019-10-15 LAB — HEPATITIS B SURFACE ANTIGEN: Hepatitis B Surface Ag: NONREACTIVE

## 2019-10-15 LAB — BILIRUBIN, DIRECT: Bilirubin, Direct: 0.1 mg/dL (ref 0.0–0.2)

## 2019-10-15 LAB — HEMOGLOBIN A1C
Hgb A1c MFr Bld: 5.7 % of total Hgb — ABNORMAL HIGH (ref <5.7)
Mean Plasma Glucose: 117 (calc)
eAG (mmol/L): 6.5 (calc)

## 2019-10-15 LAB — PHOSPHORUS: Phosphorus: 3.2 mg/dL (ref 2.5–4.5)

## 2019-10-15 LAB — HEPATITIS B SURFACE ANTIBODY,QUALITATIVE: Hep B S Ab: NONREACTIVE

## 2019-11-02 ENCOUNTER — Encounter (INDEPENDENT_AMBULATORY_CARE_PROVIDER_SITE_OTHER): Payer: Self-pay | Admitting: *Deleted

## 2019-11-02 ENCOUNTER — Other Ambulatory Visit: Payer: Self-pay

## 2019-11-02 ENCOUNTER — Encounter: Payer: Self-pay | Admitting: Infectious Disease

## 2019-11-02 VITALS — BP 131/79 | HR 74 | Temp 98.4°F | Ht 67.8 in | Wt 219.4 lb

## 2019-11-02 DIAGNOSIS — Z006 Encounter for examination for normal comparison and control in clinical research program: Secondary | ICD-10-CM

## 2019-11-02 LAB — COMPREHENSIVE METABOLIC PANEL
AG Ratio: 1.6 (calc) (ref 1.0–2.5)
ALT: 23 U/L (ref 6–29)
AST: 21 U/L (ref 10–35)
Albumin: 4.6 g/dL (ref 3.6–5.1)
Alkaline phosphatase (APISO): 112 U/L (ref 37–153)
BUN: 10 mg/dL (ref 7–25)
CO2: 27 mmol/L (ref 20–32)
Calcium: 10 mg/dL (ref 8.6–10.4)
Chloride: 108 mmol/L (ref 98–110)
Creat: 1.03 mg/dL (ref 0.50–1.05)
Globulin: 2.8 g/dL (calc) (ref 1.9–3.7)
Glucose, Bld: 81 mg/dL (ref 65–99)
Potassium: 3.9 mmol/L (ref 3.5–5.3)
Sodium: 143 mmol/L (ref 135–146)
Total Bilirubin: 0.4 mg/dL (ref 0.2–1.2)
Total Protein: 7.4 g/dL (ref 6.1–8.1)

## 2019-11-02 LAB — PHOSPHORUS: Phosphorus: 3 mg/dL (ref 2.5–4.5)

## 2019-11-02 LAB — CK: Total CK: 117 U/L (ref 29–143)

## 2019-11-02 LAB — BILIRUBIN, DIRECT: Bilirubin, Direct: 0.1 mg/dL (ref 0.0–0.2)

## 2019-11-02 NOTE — Research (Signed)
Barbara Henry was enrolled on the [employer] study after eligiblity was confirmed. She denies any new problems or vaccines lately. She was randomized to the Engerix arm and was given a dose in her rt arm without problem. She was instructed on taking her temperature and keeping up with the diary every day for 7 days. She was given a thermometer and a ruler to measure for any swelling or redness. She denied any post injection side effects. She will be returning in 4 weeks for the next injection.

## 2019-11-24 ENCOUNTER — Encounter: Payer: Self-pay | Admitting: Infectious Disease

## 2019-11-24 LAB — CD4/CD8 (T-HELPER/T-SUPPRESSOR CELL): HIV 1 RNA UltraQuant: <40

## 2019-11-30 ENCOUNTER — Encounter: Payer: Self-pay | Admitting: *Deleted

## 2019-12-02 ENCOUNTER — Ambulatory Visit: Payer: Self-pay

## 2019-12-02 ENCOUNTER — Encounter: Payer: Self-pay | Admitting: Infectious Disease

## 2019-12-02 ENCOUNTER — Other Ambulatory Visit: Payer: Self-pay | Admitting: Infectious Disease

## 2019-12-02 ENCOUNTER — Encounter (INDEPENDENT_AMBULATORY_CARE_PROVIDER_SITE_OTHER): Payer: Self-pay | Admitting: *Deleted

## 2019-12-02 ENCOUNTER — Other Ambulatory Visit: Payer: Self-pay

## 2019-12-02 VITALS — BP 113/75 | HR 76 | Temp 98.6°F | Wt 218.2 lb

## 2019-12-02 DIAGNOSIS — Z006 Encounter for examination for normal comparison and control in clinical research program: Secondary | ICD-10-CM

## 2019-12-02 LAB — CBC WITH DIFFERENTIAL/PLATELET
Absolute Monocytes: 422 cells/uL (ref 200–950)
Basophils Absolute: 13 cells/uL (ref 0–200)
Basophils Relative: 0.2 %
Eosinophils Absolute: 20 cells/uL (ref 15–500)
Eosinophils Relative: 0.3 %
HCT: 39.2 % (ref 35.0–45.0)
Hemoglobin: 13.6 g/dL (ref 11.7–15.5)
Lymphs Abs: 1756 cells/uL (ref 850–3900)
MCH: 32.5 pg (ref 27.0–33.0)
MCHC: 34.7 g/dL (ref 32.0–36.0)
MCV: 93.8 fL (ref 80.0–100.0)
MPV: 10.9 fL (ref 7.5–12.5)
Monocytes Relative: 6.4 %
Neutro Abs: 4389 cells/uL (ref 1500–7800)
Neutrophils Relative %: 66.5 %
Platelets: 182 10*3/uL (ref 140–400)
RBC: 4.18 10*6/uL (ref 3.80–5.10)
RDW: 14.2 % (ref 11.0–15.0)
Total Lymphocyte: 26.6 %
WBC: 6.6 10*3/uL (ref 3.8–10.8)

## 2019-12-02 LAB — HEPATIC FUNCTION PANEL
AG Ratio: 1.6 (calc) (ref 1.0–2.5)
ALT: 19 U/L (ref 6–29)
AST: 20 U/L (ref 10–35)
Albumin: 4.2 g/dL (ref 3.6–5.1)
Alkaline phosphatase (APISO): 85 U/L (ref 37–153)
Bilirubin, Direct: 0.1 mg/dL (ref 0.0–0.2)
Globulin: 2.6 g/dL (calc) (ref 1.9–3.7)
Indirect Bilirubin: 0.5 mg/dL (calc) (ref 0.2–1.2)
Total Bilirubin: 0.6 mg/dL (ref 0.2–1.2)
Total Protein: 6.8 g/dL (ref 6.1–8.1)

## 2019-12-02 MED ORDER — PREDNISONE 50 MG PO TABS
50.0000 mg | ORAL_TABLET | Freq: Every day | ORAL | 1 refills | Status: DC
Start: 2019-12-02 — End: 2020-01-10

## 2019-12-02 NOTE — Progress Notes (Signed)
prd

## 2019-12-02 NOTE — Research (Signed)
Barbara Henry was here for her 2nd vaccine for the [employer]. She did not have any side effects from the first injection and did bring her diary card back. She now has hives over her trunk and arms that she said started yesterday evening after she took a walk. She denies any SOB or swelling otherwise or new meds or food intake. She had been applying calamine lotion but that is not helping her itching. Dr. Daiva Eves was consulted and he prescribed some prednisone for 7 days as well as taking some benadryl 4 times a day as needed. She will pick up the prednisone at [facility] when she leaves here. She was given an injection of Engerix-B in her rt arm without any problem. She was given a new diary card to fill out for the next 7 days. She will be returning in 4 weeks.

## 2019-12-17 NOTE — Telephone Encounter (Signed)
Opened in error

## 2019-12-22 ENCOUNTER — Other Ambulatory Visit: Payer: Self-pay

## 2019-12-22 DIAGNOSIS — B20 Human immunodeficiency virus [HIV] disease: Secondary | ICD-10-CM

## 2019-12-23 LAB — T-HELPER CELL (CD4) - (RCID CLINIC ONLY)
CD4 % Helper T Cell: 37 % (ref 33–65)
CD4 T Cell Abs: 443 /uL (ref 400–1790)

## 2019-12-24 LAB — COMPLETE METABOLIC PANEL WITH GFR
AG Ratio: 1.7 (calc) (ref 1.0–2.5)
ALT: 27 U/L (ref 6–29)
AST: 22 U/L (ref 10–35)
Albumin: 4.5 g/dL (ref 3.6–5.1)
Alkaline phosphatase (APISO): 87 U/L (ref 37–153)
BUN/Creatinine Ratio: 9 (calc) (ref 6–22)
BUN: 11 mg/dL (ref 7–25)
CO2: 24 mmol/L (ref 20–32)
Calcium: 9.7 mg/dL (ref 8.6–10.4)
Chloride: 107 mmol/L (ref 98–110)
Creat: 1.2 mg/dL — ABNORMAL HIGH (ref 0.50–1.05)
GFR, Est African American: 58 mL/min/{1.73_m2} — ABNORMAL LOW (ref >=60)
GFR, Est Non African American: 50 mL/min/{1.73_m2} — ABNORMAL LOW (ref >=60)
Globulin: 2.7 g/dL (calc) (ref 1.9–3.7)
Glucose, Bld: 85 mg/dL (ref 65–99)
Potassium: 4 mmol/L (ref 3.5–5.3)
Sodium: 141 mmol/L (ref 135–146)
Total Bilirubin: 0.5 mg/dL (ref 0.2–1.2)
Total Protein: 7.2 g/dL (ref 6.1–8.1)

## 2019-12-24 LAB — CBC WITH DIFFERENTIAL/PLATELET
Absolute Monocytes: 544 cells/uL (ref 200–950)
Basophils Absolute: 19 cells/uL (ref 0–200)
Basophils Relative: 0.5 %
Eosinophils Absolute: 52 cells/uL (ref 15–500)
Eosinophils Relative: 1.4 %
HCT: 35.9 % (ref 35.0–45.0)
Hemoglobin: 12.2 g/dL (ref 11.7–15.5)
Lymphs Abs: 1258 cells/uL (ref 850–3900)
MCH: 32.6 pg (ref 27.0–33.0)
MCHC: 34 g/dL (ref 32.0–36.0)
MCV: 96 fL (ref 80.0–100.0)
MPV: 10.9 fL (ref 7.5–12.5)
Monocytes Relative: 14.7 %
Neutro Abs: 1828 cells/uL (ref 1500–7800)
Neutrophils Relative %: 49.4 %
Platelets: 158 10*3/uL (ref 140–400)
RBC: 3.74 10*6/uL — ABNORMAL LOW (ref 3.80–5.10)
RDW: 14.2 % (ref 11.0–15.0)
Total Lymphocyte: 34 %
WBC: 3.7 10*3/uL — ABNORMAL LOW (ref 3.8–10.8)

## 2019-12-24 LAB — HIV-1 RNA QUANT-NO REFLEX-BLD
HIV 1 RNA Quant: 30 copies/mL — ABNORMAL HIGH
HIV-1 RNA Quant, Log: 1.48 Log copies/mL — ABNORMAL HIGH

## 2019-12-24 LAB — RPR: RPR Ser Ql: NONREACTIVE

## 2020-01-10 ENCOUNTER — Other Ambulatory Visit: Payer: Self-pay

## 2020-01-10 ENCOUNTER — Encounter (INDEPENDENT_AMBULATORY_CARE_PROVIDER_SITE_OTHER): Payer: Self-pay | Admitting: *Deleted

## 2020-01-10 ENCOUNTER — Encounter: Payer: Self-pay | Admitting: Infectious Disease

## 2020-01-10 ENCOUNTER — Ambulatory Visit (INDEPENDENT_AMBULATORY_CARE_PROVIDER_SITE_OTHER): Payer: Self-pay | Admitting: Infectious Disease

## 2020-01-10 VITALS — BP 149/89 | HR 80 | Temp 98.1°F | Wt 212.0 lb

## 2020-01-10 VITALS — BP 143/84 | HR 80 | Temp 98.7°F | Wt 213.0 lb

## 2020-01-10 DIAGNOSIS — Z006 Encounter for examination for normal comparison and control in clinical research program: Secondary | ICD-10-CM

## 2020-01-10 DIAGNOSIS — M62838 Other muscle spasm: Secondary | ICD-10-CM | POA: Insufficient documentation

## 2020-01-10 DIAGNOSIS — Z8619 Personal history of other infectious and parasitic diseases: Secondary | ICD-10-CM

## 2020-01-10 DIAGNOSIS — B20 Human immunodeficiency virus [HIV] disease: Secondary | ICD-10-CM

## 2020-01-10 DIAGNOSIS — R600 Localized edema: Secondary | ICD-10-CM

## 2020-01-10 DIAGNOSIS — M109 Gout, unspecified: Secondary | ICD-10-CM

## 2020-01-10 HISTORY — DX: Other muscle spasm: M62.838

## 2020-01-10 LAB — CBC WITH DIFFERENTIAL/PLATELET
Absolute Monocytes: 511 cells/uL (ref 200–950)
Basophils Absolute: 32 cells/uL (ref 0–200)
Basophils Relative: 0.7 %
Eosinophils Absolute: 18 cells/uL (ref 15–500)
Eosinophils Relative: 0.4 %
HCT: 37 % (ref 35.0–45.0)
Hemoglobin: 12.9 g/dL (ref 11.7–15.5)
Lymphs Abs: 1799 cells/uL (ref 850–3900)
MCH: 33.2 pg — ABNORMAL HIGH (ref 27.0–33.0)
MCHC: 34.9 g/dL (ref 32.0–36.0)
MCV: 95.1 fL (ref 80.0–100.0)
MPV: 10.5 fL (ref 7.5–12.5)
Monocytes Relative: 11.1 %
Neutro Abs: 2240 cells/uL (ref 1500–7800)
Neutrophils Relative %: 48.7 %
Platelets: 191 10*3/uL (ref 140–400)
RBC: 3.89 10*6/uL (ref 3.80–5.10)
RDW: 14.3 % (ref 11.0–15.0)
Total Lymphocyte: 39.1 %
WBC: 4.6 10*3/uL (ref 3.8–10.8)

## 2020-01-10 LAB — HEPATIC FUNCTION PANEL
AG Ratio: 1.6 (calc) (ref 1.0–2.5)
ALT: 30 U/L — ABNORMAL HIGH (ref 6–29)
AST: 35 U/L (ref 10–35)
Albumin: 4.5 g/dL (ref 3.6–5.1)
Alkaline phosphatase (APISO): 117 U/L (ref 37–153)
Bilirubin, Direct: 0.2 mg/dL (ref 0.0–0.2)
Globulin: 2.8 g/dL (calc) (ref 1.9–3.7)
Indirect Bilirubin: 0.5 mg/dL (calc) (ref 0.2–1.2)
Total Bilirubin: 0.7 mg/dL (ref 0.2–1.2)
Total Protein: 7.3 g/dL (ref 6.1–8.1)

## 2020-01-10 NOTE — Progress Notes (Signed)
Chief complaint: For HIV disease complaining  Leg cramping  Subjective:     Patient ID: Barbara Henry, female    DOB: 1960/09/27, 59 y.o.   MRN: [id number]  HPI  59 year old female who is doing quite well on her current regimen TRIUMEQ with undetectable R load and healthy CD4 count. She has cured her hepatitis C with HARVONI.   But is complaining of some leg cramping which I wonder may be related to her diuretic use potassium was normal only saw it last.  She does not have a primary care doctor yet.        Past Medical History:  Diagnosis Date  . Anemia   . Gout 05/15/2018  . Gout attack 02/22/2016  . Hepatitis C without hepatic coma   . History of hepatitis C 02/01/2015  . HIV (human immunodeficiency virus infection) (HCC)   . HTN (hypertension) 02/19/2017  . HTN, white coat 02/01/2015  . Low back pain 07/27/2019  . Shingles     No past surgical history on file.  Family History  Problem Relation Age of Onset  . Diabetes Sister   . Diabetes Brother   . Breast cancer Maternal Aunt 50  . Breast cancer Maternal Aunt 79      Social History   Socioeconomic History  . Marital status: Single    Spouse name: Not on file  . Number of children: 4  . Years of education: Not on file  . Highest education level: 12th grade  Occupational History  . Not on file  Tobacco Use  . Smoking status: Never Smoker  . Smokeless tobacco: Never Used  Vaping Use  . Vaping Use: Never used  Substance and Sexual Activity  . Alcohol use: Yes    Alcohol/week: 2.0 standard drinks    Types: 2 Standard drinks or equivalent per week    Comment: beer  . Drug use: No  . Sexual activity: Yes    Partners: Male    Birth control/protection: Condom    Comment: declined condoms, last sexual encounter 03/2013  Other Topics Concern  . Not on file  Social History Narrative  . Not on file   Social Determinants of Health   Financial Resource Strain:   . Difficulty of Paying Living Expenses:    Food Insecurity:   . Worried About Programme researcher, broadcasting/film/video in the Last Year:   . Barista in the Last Year:   Transportation Needs:   . Freight forwarder (Medical):   Marland Kitchen Lack of Transportation (Non-Medical):   Physical Activity:   . Days of Exercise per Week:   . Minutes of Exercise per Session:   Stress:   . Feeling of Stress :   Social Connections:   . Frequency of Communication with Friends and Family:   . Frequency of Social Gatherings with Friends and Family:   . Attends Religious Services:   . Active Member of Clubs or Organizations:   . Attends Banker Meetings:   Marland Kitchen Marital Status:     No Known Allergies   Current Outpatient Medications:  .  abacavir-dolutegravir-lamiVUDine (TRIUMEQ) 600-50-300 MG tablet, Take 1 tablet by mouth daily., Disp: 30 tablet, Rfl: 11 .  aspirin 325 MG tablet, Take 325 mg by mouth once.  , Disp: , Rfl:  .  colchicine 0.6 MG tablet, With gout flare take 1 tab q 8 hrs on day one, then take 1 tab TWICE DAILY until see Dr. Daiva Eves  again, Disp: 60 tablet, Rfl: 11 .  furosemide (LASIX) 20 MG tablet, Take 0.5 tablets (10 mg total) by mouth daily as needed., Disp: 30 tablet, Rfl: 0 .  predniSONE (DELTASONE) 50 MG tablet, Take 1 tablet (50 mg total) by mouth daily with breakfast., Disp: 7 tablet, Rfl: 1 .  probenecid (BENEMID) 500 MG tablet, TAKE 1 TABLET BY MOUTH TWICE DAILY, Disp: 60 tablet, Rfl: 4   Review of Systems  Constitutional: Negative for activity change, appetite change, chills, diaphoresis, fatigue, fever and unexpected weight change.  HENT: Negative for congestion, postnasal drip, rhinorrhea, sinus pressure, sneezing, sore throat and trouble swallowing.   Eyes: Negative for photophobia and visual disturbance.  Respiratory: Negative for cough, chest tightness, shortness of breath, wheezing and stridor.   Cardiovascular: Negative for chest pain, palpitations and leg swelling.  Gastrointestinal: Negative for abdominal  distention, abdominal pain, anal bleeding, blood in stool, constipation, diarrhea, nausea and vomiting.  Genitourinary: Negative for difficulty urinating, dysuria, flank pain and hematuria.  Musculoskeletal: Positive for back pain and myalgias. Negative for arthralgias and gait problem.  Skin: Negative for color change, pallor, rash and wound.  Neurological: Negative for dizziness, tremors, weakness and light-headedness.  Hematological: Negative for adenopathy. Does not bruise/bleed easily.  Psychiatric/Behavioral: Negative for agitation, behavioral problems, confusion, decreased concentration, dysphoric mood, hallucinations and sleep disturbance.       Objective:   Physical Exam Constitutional:      General: She is not in acute distress.    Appearance: She is well-developed. She is not diaphoretic.  HENT:     Head: Normocephalic and atraumatic.     Mouth/Throat:     Pharynx: No oropharyngeal exudate.  Eyes:     General: No scleral icterus.    Conjunctiva/sclera: Conjunctivae normal.  Cardiovascular:     Rate and Rhythm: Normal rate and regular rhythm.  Pulmonary:     Effort: Pulmonary effort is normal. No respiratory distress.     Breath sounds: No wheezing.  Abdominal:     General: There is no distension.     Tenderness: There is no abdominal tenderness.  Musculoskeletal:        General: No tenderness.     Cervical back: Normal range of motion and neck supple.  Skin:    General: Skin is warm and dry.     Findings: No erythema or rash.  Neurological:     Mental Status: She is alert and oriented to person, place, and time.     Motor: No abnormal muscle tone.     Coordination: Coordination normal.  Psychiatric:        Behavior: Behavior normal.        Thought Content: Thought content normal.        Judgment: Judgment normal.       Assessment & Plan:    HIV: continue TRIUMEQ, perfectly  Suppressed     Gout : Supposedly on colchicine  Lower extremity edema: Likely  prescribed furosemide for this I did take this off of her medication list until she can establish a primary care  Muscle spasms: Likely due to electrolyte abnormalities have recommended stopping her Lasix and making sure that she has good hydration status including electrolytes

## 2020-01-10 NOTE — Research (Signed)
Barbara Henry was here today for her week 8 visit for A5379 and for Reprieve month 68. She is a little late for the A5379 visit, because she already had an appt. With Dr. Daiva Eves today and wanted to come the same day to avoid travel expense. She said the hives had cleared up within a week of starting  the prednisone and benadryl.  She denies any other side effects of the vaccine or any new complaints. She says her adherence is excellent with her study med. She will be returning in a few weeks for the week 12 visit.

## 2020-01-25 ENCOUNTER — Encounter: Payer: Self-pay | Admitting: *Deleted

## 2020-02-01 ENCOUNTER — Other Ambulatory Visit: Payer: Self-pay

## 2020-02-01 ENCOUNTER — Encounter (INDEPENDENT_AMBULATORY_CARE_PROVIDER_SITE_OTHER): Payer: Self-pay | Admitting: *Deleted

## 2020-02-01 VITALS — BP 122/86 | HR 67 | Temp 98.6°F | Wt 211.4 lb

## 2020-02-01 DIAGNOSIS — Z006 Encounter for examination for normal comparison and control in clinical research program: Secondary | ICD-10-CM

## 2020-02-01 NOTE — Research (Signed)
Barbara Henry was here for her week 12 visit for A5379, the [facility] study. She says her ankles up to her knees have been aching more and feeling like they are going to give out on her. She has +2 pedal edema bilaterally. She is taking colchicine for gout and needs a refill. I told her to call the pharmacy because she should have plenty of refills on the medication. She is supposed to see Internal medicine in 3 weeks for her first visit with them. I did discuss the edema with Dr. Daiva Eves and he said that a fluid pill will cause her gout to get worse though and she needs to see primary care for them to follow that. She did not want to take any diuretic right now if it will cause the gout to get worse. She will be returning for study in November when she will get the third dose of the Hep B vaccine.

## 2020-02-08 ENCOUNTER — Other Ambulatory Visit: Payer: Self-pay

## 2020-02-08 ENCOUNTER — Telehealth: Payer: Self-pay | Admitting: Pharmacy Technician

## 2020-02-08 ENCOUNTER — Other Ambulatory Visit: Payer: Self-pay | Admitting: Infectious Disease

## 2020-02-08 MED ORDER — COLCHICINE 0.6 MG PO TABS: ORAL_TABLET | ORAL | 3 refills | Status: DC

## 2020-02-08 NOTE — Telephone Encounter (Signed)
[facility] Patient Advocate Encounter  Patient's Colchicine prescription has been transferred to [facility] where her other medicaitons are.  The goal is to reduce the number of pharmacies she has to interact with.  If their cash price is much over $9 a month, we can transfer back to [facility].  Netty Starring. Dimas Aguas CPhT Special[facility] Pharmacy Patient [facility] Phone: [phone] Fax:  [phone]

## 2020-02-10 MED FILL — COLCRYS 0.6 MG TABLET: 0.6 | 29 days supply | Qty: 60 | Fill #1

## 2020-02-22 ENCOUNTER — Encounter: Payer: Self-pay | Admitting: Student

## 2020-02-22 ENCOUNTER — Ambulatory Visit: Payer: Self-pay | Admitting: Student

## 2020-02-22 ENCOUNTER — Other Ambulatory Visit: Payer: Self-pay

## 2020-02-22 VITALS — BP 117/68 | HR 66 | Temp 98.0°F | Ht 67.0 in | Wt 213.0 lb

## 2020-02-22 DIAGNOSIS — M109 Gout, unspecified: Secondary | ICD-10-CM

## 2020-02-22 DIAGNOSIS — Z0000 Encounter for general adult medical examination without abnormal findings: Secondary | ICD-10-CM

## 2020-02-22 DIAGNOSIS — R252 Cramp and spasm: Secondary | ICD-10-CM

## 2020-02-22 DIAGNOSIS — B20 Human immunodeficiency virus [HIV] disease: Secondary | ICD-10-CM

## 2020-02-22 DIAGNOSIS — R600 Localized edema: Secondary | ICD-10-CM

## 2020-02-22 DIAGNOSIS — Z23 Encounter for immunization: Secondary | ICD-10-CM

## 2020-02-22 NOTE — Assessment & Plan Note (Signed)
Patient has a gout flare attack last week which colchicine helped.  Patient stated that her gout flare happens a few times a month.  Her last Uric acid level was 6.8 in 2019.  Patient currently not taking allopurinol or probenecid.  She was started on improved allopurinol and was DC in 2017.  We will recheck uric acid level today.  We will restart allopurinol 100 mg.  Plan: -Restart allopurinol 100 mg daily -Recheck uric acid level -Colchicine for gout flare

## 2020-02-22 NOTE — Patient Instructions (Addendum)
Ms. Pond,   It was a pleasure getting to know you today.  Here is a summary of what we talked about:  1.  Gout: I will send a prescription for allopurinol 100 mg, please take 1 pill a day.  I will also check the uric acid level to help with the management.  Continue taking colchicine for any gout flares.  2.  Leg cramps: I will check some blood work for your electrolytes and iron level.  3.  Leg swelling: Please stop the Lasix.  Try knee-high compression stocking.  Elevate your legs as much as possible.  4.  HIV: Please continue to follow-up with Dr. Algis Liming.   I also have referrals to colonoscopy and mammogram.  Please let us know if you have any questions or concerns  TakeCare,  Dr. Cyndie Chime

## 2020-02-22 NOTE — Assessment & Plan Note (Signed)
Continue following up with Dr. Algis Liming Continue taking Triumeq

## 2020-02-22 NOTE — Assessment & Plan Note (Signed)
Patient complains of lower extremity edema after walking.  She denies shortness of breath.  Patient was taking Lasix with some relief.  Due to her new onset of leg cramps, will stop Lasix and check a BMP.  Patient is advised to use knee-high compression stocking with leg elevation.  Plan: -Stop Lasix -Compression stocking and leg elevation

## 2020-02-22 NOTE — Progress Notes (Addendum)
CC: Establish care and gout management   HPI:  [name] is a 59 y.o. female with past medical history of gout and HIV, who presents to the clinic to establish care with [place].  Patient is following Dr. Algis Liming for HIV management and is on Triumeq, last visit was January 10, 2020.  Patient is also in a research trial with BeeHive, Reprieve and [phone] (prevent vascular events in HIV).  Patient lives in [place].  She is living with her older brother.  All her children are grown and live separately.   Patient reports feeling well today.  She states that she has a recent gout flare last week which he took colchicine and it helps.  Her gout flares happens a few times a month.  Patient states that she is not taking any allopurinol or probenecid.  Patient also complains of left leg cramps that happens at night and going on for a few hours.  This started in August this year and happens about 2 times per night.  Patient also has lower extremity edema which she was taking Lasix, which was started in April.   Family history: Mother-colon cancer Aunt-breast cancer  Social history: No smoking Occasional alcohol with beer No drug use  Surgical history: No surgical history  Past Medical History:  Diagnosis Date  . Anemia   . Gout 05/15/2018  . Gout attack 02/22/2016  . Hepatitis C without hepatic coma   . History of hepatitis C 02/01/2015  . HIV (human immunodeficiency virus infection) (HCC)   . HTN (hypertension) 02/19/2017  . HTN, white coat 02/01/2015  . Low back pain 07/27/2019  . Muscle spasm 01/10/2020  . Shingles    Review of Systems:  Review of Systems  Constitutional: Negative.   HENT: Negative.   Eyes: Negative.   Respiratory: Negative.   Cardiovascular: Positive for leg swelling.  Gastrointestinal: Negative for abdominal pain, constipation and diarrhea.  Musculoskeletal:       Left leg cramp  Skin: Negative.     Physical Exam: Physical  Exam Constitutional:      General: She is not in acute distress. HENT:     Head: Normocephalic.  Eyes:     General: No scleral icterus.       Right eye: No discharge.        Left eye: No discharge.  Cardiovascular:     Rate and Rhythm: Normal rate and regular rhythm.     Heart sounds: No murmur heard.   Pulmonary:     Breath sounds: Normal breath sounds. No wheezing.  Abdominal:     General: Bowel sounds are normal.  Musculoskeletal:     Cervical back: Normal range of motion.     Right lower leg: Edema present.     Left lower leg: Edema present.     Comments: Trace edema lower extremity bilat  Skin:    General: Skin is warm.     Coloration: Skin is not jaundiced.  Neurological:     General: No focal deficit present.     Mental Status: She is alert.  Psychiatric:        Mood and Affect: Mood normal.     Vitals:   02/22/20 0852  BP: 117/68  Pulse: 66  Temp: 98 F (36.7 C)  TempSrc: Oral  SpO2: 100%  Weight: 213 lb (96.6 kg)  Height: 5\' 7"  (1.702 m)     Assessment & Plan:   See Encounters Tab for problem based  charting.  Patient seen with Dr. Mikey Bussing

## 2020-02-22 NOTE — Assessment & Plan Note (Signed)
Patient reports left leg cramps started in August.  Usually happen at 2-3 times a night night and last a few hours.  Patient was started on Lasix in April for lower extremity edema.  We will check a BMP for any electrolytes abnormalities.  We will also check a ferritin for any underlying iron deficiency anemia.  Plan: -Stop Lasix -Check BMP -Check ferritin

## 2020-02-22 NOTE — Assessment & Plan Note (Signed)
Referral to colonoscopy Referral to repeat mammogram Pap smear up-to-date.  Due 2022 Up-to-date with Covid vaccine Flu shot given today

## 2020-02-23 ENCOUNTER — Telehealth: Payer: Self-pay

## 2020-02-23 ENCOUNTER — Encounter: Payer: Self-pay | Admitting: Infectious Disease

## 2020-02-23 LAB — BMP8+ANION GAP
Anion Gap: 14 mmol/L (ref 10.0–18.0)
BUN/Creatinine Ratio: 11 (ref 9–23)
BUN: 12 mg/dL (ref 6–24)
CO2: 19 mmol/L — ABNORMAL LOW (ref 20–29)
Calcium: 9.3 mg/dL (ref 8.7–10.2)
Chloride: 108 mmol/L — ABNORMAL HIGH (ref 96–106)
Creatinine, Ser: 1.09 mg/dL — ABNORMAL HIGH (ref 0.57–1.00)
GFR calc Af Amer: 65 mL/min/{1.73_m2} (ref >59)
GFR calc non Af Amer: 56 mL/min/{1.73_m2} — ABNORMAL LOW (ref >59)
Glucose: 96 mg/dL (ref 65–99)
Potassium: 4.3 mmol/L (ref 3.5–5.2)
Sodium: 141 mmol/L (ref 134–144)

## 2020-02-23 LAB — LIPID PANEL
Chol/HDL Ratio: 2.6 ratio (ref 0.0–4.4)
Cholesterol, Total: 190 mg/dL (ref 100–199)
HDL: 72 mg/dL (ref >39)
LDL Chol Calc (NIH): 98 mg/dL (ref 0–99)
Triglycerides: 117 mg/dL (ref 0–149)
VLDL Cholesterol Cal: 20 mg/dL (ref 5–40)

## 2020-02-23 LAB — URIC ACID: Uric Acid: 10.3 mg/dL — ABNORMAL HIGH (ref 3.0–7.2)

## 2020-02-23 LAB — CD4/CD8 (T-HELPER/T-SUPPRESSOR CELL)
CD4 % Helper T Cell: 40.8
CD4 Count: 653
CD8 % Suppressor T Cell: 40.7
CD8 T Cell Abs: 651

## 2020-02-23 LAB — FERRITIN: Ferritin: 144 ng/mL (ref 15–150)

## 2020-02-23 NOTE — Telephone Encounter (Signed)
I called the patient. Thank you.

## 2020-02-23 NOTE — Telephone Encounter (Signed)
Requesting lab results, please call pt back.  

## 2020-02-25 NOTE — Progress Notes (Signed)
Internal Medicine Clinic Attending  I saw and evaluated the patient.  I personally confirmed the key portions of the history and exam documented by Dr. Nguyen and I reviewed pertinent patient test results.  The assessment, diagnosis, and plan were formulated together and I agree with the documentation in the resident's note.\  

## 2020-04-18 ENCOUNTER — Other Ambulatory Visit: Payer: Self-pay

## 2020-04-18 ENCOUNTER — Encounter (INDEPENDENT_AMBULATORY_CARE_PROVIDER_SITE_OTHER): Payer: Self-pay | Admitting: *Deleted

## 2020-04-18 VITALS — BP 129/86 | HR 62 | Temp 97.9°F | Wt 211.2 lb

## 2020-04-18 DIAGNOSIS — Z006 Encounter for examination for normal comparison and control in clinical research program: Secondary | ICD-10-CM

## 2020-04-18 NOTE — Research (Signed)
Barbara Henry seen today for her week 24 visit for A5379, the [facility] study. Continues to have swelling in her (R) lower leg and ankle. States that she has been wearing compression stockings and elevating her legs which she feels has been helping. States that her (L) knee started "throbbing" over the weekend. States that the cold weather makes it ache more. Denied any injury to her knee. States that she has tried ibuprofen for the discomfort which she states does help. She received her 3rd Engerix-B injection in her (R) deltoid without problem. She will return in December for her next study visit and in January to see Dr. Daiva Eves.

## 2020-05-05 NOTE — Addendum Note (Signed)
Addended by: Dorie Rank E on: 05/05/2020 12:05 PM   Modules accepted: Orders

## 2020-05-18 ENCOUNTER — Encounter (INDEPENDENT_AMBULATORY_CARE_PROVIDER_SITE_OTHER): Payer: Self-pay | Admitting: *Deleted

## 2020-05-18 ENCOUNTER — Other Ambulatory Visit: Payer: Self-pay

## 2020-05-18 VITALS — BP 107/76 | HR 79 | Temp 98.1°F | Wt 206.4 lb

## 2020-05-18 DIAGNOSIS — Z006 Encounter for examination for normal comparison and control in clinical research program: Secondary | ICD-10-CM

## 2020-05-18 NOTE — Research (Signed)
Barbara Henry seen today for her week 28 visit for A5379 and for Reprieve month 72. States that she had mild site swelling and redness after her last Hep B vaccine which lasted a couple of days. No other complaints verbalized. Continues to have mild swelling to lower extremities, R>L. Denies any muscle aches or weakness. She will return in January for her next study visit and to see Dr. Daiva Eves

## 2020-05-19 LAB — COMPREHENSIVE METABOLIC PANEL
AG Ratio: 1.6 (calc) (ref 1.0–2.5)
ALT: 29 U/L (ref 6–29)
AST: 31 U/L (ref 10–35)
Albumin: 4.5 g/dL (ref 3.6–5.1)
Alkaline phosphatase (APISO): 78 U/L (ref 37–153)
BUN/Creatinine Ratio: 6 (calc) (ref 6–22)
BUN: 7 mg/dL (ref 7–25)
CO2: 24 mmol/L (ref 20–32)
Calcium: 9.6 mg/dL (ref 8.6–10.4)
Chloride: 103 mmol/L (ref 98–110)
Creat: 1.12 mg/dL — ABNORMAL HIGH (ref 0.50–1.05)
Globulin: 2.9 g/dL (calc) (ref 1.9–3.7)
Glucose, Bld: 82 mg/dL (ref 65–99)
Potassium: 3.7 mmol/L (ref 3.5–5.3)
Sodium: 139 mmol/L (ref 135–146)
Total Bilirubin: 0.3 mg/dL (ref 0.2–1.2)
Total Protein: 7.4 g/dL (ref 6.1–8.1)

## 2020-05-19 LAB — CK: Total CK: 118 U/L (ref 29–143)

## 2020-05-19 LAB — BILIRUBIN, DIRECT: Bilirubin, Direct: 0.1 mg/dL (ref 0.0–0.2)

## 2020-05-19 LAB — PHOSPHORUS: Phosphorus: 3.1 mg/dL (ref 2.5–4.5)

## 2020-05-23 ENCOUNTER — Encounter: Payer: Self-pay | Admitting: Infectious Disease

## 2020-05-23 LAB — CD4/CD8 (T-HELPER/T-SUPPRESSOR CELL)
CD4 Count: 611
CD4%: 40.7
CD8 % Suppressor T Cell: 45.6
CD8: 684

## 2020-06-13 ENCOUNTER — Encounter: Payer: Self-pay | Admitting: Infectious Disease

## 2020-06-13 ENCOUNTER — Encounter (INDEPENDENT_AMBULATORY_CARE_PROVIDER_SITE_OTHER): Payer: Self-pay | Admitting: *Deleted

## 2020-06-13 ENCOUNTER — Ambulatory Visit (INDEPENDENT_AMBULATORY_CARE_PROVIDER_SITE_OTHER): Payer: Self-pay | Admitting: Infectious Disease

## 2020-06-13 ENCOUNTER — Ambulatory Visit: Payer: Self-pay

## 2020-06-13 ENCOUNTER — Other Ambulatory Visit: Payer: Self-pay

## 2020-06-13 VITALS — BP 141/84 | HR 80 | Temp 97.6°F | Wt 204.0 lb

## 2020-06-13 DIAGNOSIS — I10 Essential (primary) hypertension: Secondary | ICD-10-CM

## 2020-06-13 DIAGNOSIS — Z006 Encounter for examination for normal comparison and control in clinical research program: Secondary | ICD-10-CM

## 2020-06-13 DIAGNOSIS — M109 Gout, unspecified: Secondary | ICD-10-CM

## 2020-06-13 DIAGNOSIS — B20 Human immunodeficiency virus [HIV] disease: Secondary | ICD-10-CM

## 2020-06-13 NOTE — Research (Signed)
Barbara Henry was here for week 32 of the [facility] study. She denies any current problems but did say that she had  Cold symptoms around christmas that lasted for a week. She did not get tested and took OTC cough medicine.  She also saw Dr. Daiva Eves today. She will be returning in April for the next study visit.

## 2020-06-13 NOTE — Progress Notes (Signed)
Chief complaint: For HIV disease  Subjective:     Patient ID: Barbara Henry, female    DOB: 10-05-1960, 60 y.o.   MRN: [id number]  HPI  60 year old female who is doing quite well on her current regimen TRIUMEQ with undetectable R load and healthy CD4 count.   Of note her past antiretroviral history had consisted of her being on Atripla with some very illogical failure but without documented resistance, with changed to Prezista Norvir Truvada then changed to TRIUMEQ to accommodate her hepatitis C therapy.  She has been followed by Selena Batten and the [facility] and had labs drawn recently viral load is not yet back.  She is seeing internal medicine as well.      Past Medical History:  Diagnosis Date  . Anemia   . Gout 05/15/2018  . Gout attack 02/22/2016  . Hepatitis C without hepatic coma   . History of hepatitis C 02/01/2015  . HIV (human immunodeficiency virus infection) (HCC)   . HTN (hypertension) 02/19/2017  . HTN, white coat 02/01/2015  . Low back pain 07/27/2019  . Muscle spasm 01/10/2020  . Shingles     No past surgical history on file.  Family History  Problem Relation Age of Onset  . Diabetes Sister   . Diabetes Brother   . Breast cancer Maternal Aunt 50  . Breast cancer Maternal Aunt 82      Social History   Socioeconomic History  . Marital status: Single    Spouse name: Not on file  . Number of children: 4  . Years of education: Not on file  . Highest education level: 12th grade  Occupational History  . Not on file  Tobacco Use  . Smoking status: Never Smoker  . Smokeless tobacco: Never Used  Vaping Use  . Vaping Use: Never used  Substance and Sexual Activity  . Alcohol use: Yes    Alcohol/week: 2.0 standard drinks    Types: 2 Standard drinks or equivalent per week    Comment: beer  . Drug use: No  . Sexual activity: Yes    Partners: Male    Birth control/protection: Condom    Comment: declined condoms, last sexual encounter 03/2013  Other Topics Concern   . Not on file  Social History Narrative  . Not on file   Social Determinants of Health   Financial Resource Strain: Not on file  Food Insecurity: Not on file  Transportation Needs: Not on file  Physical Activity: Not on file  Stress: Not on file  Social Connections: Not on file    No Known Allergies   Current Outpatient Medications:  .  abacavir-dolutegravir-lamiVUDine (TRIUMEQ) 600-50-300 MG tablet, Take 1 tablet by mouth daily., Disp: 30 tablet, Rfl: 11 .  aspirin 325 MG tablet, Take 325 mg by mouth once., Disp: , Rfl:  .  colchicine 0.6 MG tablet, With gout flare take 1 tab q 8 hrs on day one, then take 1 tab TWICE DAILY until see Dr. Daiva Eves again, Disp: 60 tablet, Rfl: 3 .  probenecid (BENEMID) 500 MG tablet, TAKE 1 TABLET BY MOUTH TWICE DAILY, Disp: 60 tablet, Rfl: 4   Review of Systems  Constitutional: Negative for activity change, appetite change, chills, diaphoresis, fatigue, fever and unexpected weight change.  HENT: Negative for congestion, postnasal drip, rhinorrhea, sinus pressure, sneezing, sore throat and trouble swallowing.   Eyes: Negative for photophobia and visual disturbance.  Respiratory: Negative for cough, chest tightness, shortness of breath, wheezing and  stridor.   Cardiovascular: Negative for chest pain, palpitations and leg swelling.  Gastrointestinal: Negative for abdominal distention, abdominal pain, anal bleeding, blood in stool, constipation, diarrhea, nausea and vomiting.  Genitourinary: Negative for difficulty urinating, dysuria, flank pain and hematuria.  Musculoskeletal: Negative for arthralgias and gait problem.  Skin: Negative for color change, pallor, rash and wound.  Neurological: Negative for dizziness, tremors, weakness and light-headedness.  Hematological: Negative for adenopathy. Does not bruise/bleed easily.  Psychiatric/Behavioral: Negative for agitation, behavioral problems, confusion, decreased concentration, dysphoric mood,  hallucinations and sleep disturbance.       Objective:   Physical Exam Constitutional:      General: She is not in acute distress.    Appearance: She is well-developed. She is not diaphoretic.  HENT:     Head: Normocephalic and atraumatic.     Mouth/Throat:     Pharynx: No oropharyngeal exudate.  Eyes:     General: No scleral icterus.    Conjunctiva/sclera: Conjunctivae normal.  Cardiovascular:     Rate and Rhythm: Normal rate and regular rhythm.  Pulmonary:     Effort: Pulmonary effort is normal. No respiratory distress.     Breath sounds: No wheezing.  Abdominal:     General: There is no distension.     Tenderness: There is no abdominal tenderness.  Musculoskeletal:        General: No tenderness.     Cervical back: Normal range of motion and neck supple.  Skin:    General: Skin is warm and dry.     Findings: No erythema or rash.  Neurological:     Mental Status: She is alert and oriented to person, place, and time.     Motor: No abnormal muscle tone.     Coordination: Coordination normal.  Psychiatric:        Behavior: Behavior normal.        Thought Content: Thought content normal.        Judgment: Judgment normal.       Assessment & Plan:    HIV: Contemplated simplifying her to [place] but I have some concern about the possibility that she could have had severe logical failure with resistance also considered switch to [facility] but she likes staying on TRIUMEQ for now.    Gout : Followed by internal medicine  Hypertension on medications and followed by internal medicine

## 2020-08-02 ENCOUNTER — Encounter: Payer: Self-pay | Admitting: Infectious Disease

## 2020-08-02 ENCOUNTER — Other Ambulatory Visit: Payer: Self-pay | Admitting: Infectious Disease

## 2020-08-02 DIAGNOSIS — B20 Human immunodeficiency virus [HIV] disease: Secondary | ICD-10-CM

## 2020-09-26 ENCOUNTER — Other Ambulatory Visit (HOSPITAL_COMMUNITY): Payer: Self-pay

## 2020-09-26 MED ORDER — STUDY - REPRIEVE A5332 - PITAVASTATIN 4MG OR PLACEBO TABLET (PI-VAN DAM)
ORAL_TABLET | ORAL | 0 refills | Status: DC
  Filled 2020-09-26: qty 90, 90d supply, fill #0

## 2020-09-27 ENCOUNTER — Encounter (INDEPENDENT_AMBULATORY_CARE_PROVIDER_SITE_OTHER): Payer: Self-pay | Admitting: *Deleted

## 2020-09-27 ENCOUNTER — Other Ambulatory Visit: Payer: Self-pay

## 2020-09-27 VITALS — BP 133/84 | HR 67 | Temp 97.9°F | Wt 196.5 lb

## 2020-09-27 DIAGNOSIS — Z006 Encounter for examination for normal comparison and control in clinical research program: Secondary | ICD-10-CM

## 2020-09-28 NOTE — Research (Signed)
Barbara Henry was here for both Reprieve and the [facility] studies. She denies any new problems. She got her Covid booster back in December and says she is taking her meds everyday (pitavastain and her ARV). She will return in August for Reprieve and November for the [facility] study.

## 2020-10-16 ENCOUNTER — Other Ambulatory Visit (HOSPITAL_COMMUNITY): Payer: Self-pay

## 2020-12-05 ENCOUNTER — Ambulatory Visit: Payer: Self-pay

## 2020-12-05 ENCOUNTER — Other Ambulatory Visit: Payer: Self-pay

## 2021-01-17 ENCOUNTER — Encounter (INDEPENDENT_AMBULATORY_CARE_PROVIDER_SITE_OTHER): Payer: Self-pay | Admitting: *Deleted

## 2021-01-17 ENCOUNTER — Other Ambulatory Visit: Payer: Self-pay

## 2021-01-17 ENCOUNTER — Encounter: Payer: Self-pay | Admitting: *Deleted

## 2021-01-17 DIAGNOSIS — Z006 Encounter for examination for normal comparison and control in clinical research program: Secondary | ICD-10-CM

## 2021-01-17 NOTE — Research (Signed)
Barbara Henry was here for her Reprieve month 80 study visit. She denies any new problems or medications. She said she needed some dental work and was referred to the dental clinic here. She will be back 11/1 for both the Reprieve and A5379 study visits.

## 2021-02-19 ENCOUNTER — Other Ambulatory Visit: Payer: Self-pay | Admitting: *Deleted

## 2021-02-19 DIAGNOSIS — B20 Human immunodeficiency virus [HIV] disease: Secondary | ICD-10-CM

## 2021-02-19 MED ORDER — TRIUMEQ 600-50-300 MG PO TABS: 1.0000 | ORAL_TABLET | Freq: Every day | ORAL | 2 refills | Status: DC

## 2021-04-03 ENCOUNTER — Other Ambulatory Visit: Payer: Self-pay

## 2021-04-03 ENCOUNTER — Encounter (INDEPENDENT_AMBULATORY_CARE_PROVIDER_SITE_OTHER): Payer: Self-pay | Admitting: *Deleted

## 2021-04-03 VITALS — BP 150/85 | HR 59 | Temp 98.2°F | Wt 175.3 lb

## 2021-04-03 DIAGNOSIS — Z006 Encounter for examination for normal comparison and control in clinical research program: Secondary | ICD-10-CM

## 2021-04-03 NOTE — Research (Signed)
Barbara Henry seen today for both Reprieve and BeeHIV[facility] study. No new complaints or medications. Noted that she has had a 10% decrease in weight since her visit in April. States that she has been trying to lose weight. States that she has been walking everyday and has cut out "junk food' and is eating healthy.  She is scheduled to see Marcos Eke, NP next week. Verbalized excellent adherence with her medications. Denied any muscle aches or weakness. She will return in March for her next study visit.

## 2021-04-09 ENCOUNTER — Ambulatory Visit: Payer: Self-pay | Admitting: Family

## 2021-04-11 ENCOUNTER — Ambulatory Visit: Payer: Self-pay | Admitting: Family

## 2021-04-19 ENCOUNTER — Encounter: Payer: Self-pay | Admitting: Family

## 2021-05-03 ENCOUNTER — Other Ambulatory Visit: Payer: Self-pay

## 2021-05-03 ENCOUNTER — Encounter: Payer: Self-pay | Admitting: Family

## 2021-05-03 ENCOUNTER — Ambulatory Visit (INDEPENDENT_AMBULATORY_CARE_PROVIDER_SITE_OTHER): Payer: Self-pay | Admitting: Family

## 2021-05-03 VITALS — BP 143/95 | HR 71 | Temp 97.5°F | Wt 176.0 lb

## 2021-05-03 DIAGNOSIS — Z79899 Other long term (current) drug therapy: Secondary | ICD-10-CM

## 2021-05-03 DIAGNOSIS — Z23 Encounter for immunization: Secondary | ICD-10-CM

## 2021-05-03 DIAGNOSIS — Z0000 Encounter for general adult medical examination without abnormal findings: Secondary | ICD-10-CM

## 2021-05-03 DIAGNOSIS — B20 Human immunodeficiency virus [HIV] disease: Secondary | ICD-10-CM

## 2021-05-03 MED ORDER — TRIUMEQ 600-50-300 MG PO TABS: 1.0000 | ORAL_TABLET | Freq: Every day | ORAL | 4 refills | Status: DC

## 2021-05-03 NOTE — Assessment & Plan Note (Signed)
Barbara Henry appears to have well-controlled virus with good adherence and tolerance to her ART regimen of Triumeq.  No signs/symptoms of opportunistic infection.  We discussed plan of care to include blood work today and continuing current dose of Triumeq.  Need to renew financial assistance after January 1.  Plan for follow-up in 6 months or sooner if needed with lab work on the same day.

## 2021-05-03 NOTE — Patient Instructions (Addendum)
Nice to see you.  Continue to take your medication daily as prescribed.  Refills have been sent to the pharmacy.  Please call [facility]) to schedule/follow up on your dental care at [phone]  Plan for follow up in 6 months or sooner if needed with lab work on the same day.  Have a great day and stay safe!

## 2021-05-03 NOTE — Progress Notes (Signed)
Patient ID: Barbara Henry, female    DOB: May 15, 1961, 60 y.o.   MRN: [id number]  Subjective:    Chief Complaint  Patient presents with   Follow-up    B20    HPI:  Barbara Henry is a 60 y.o. female with HIV disease last seen by Dr. Daiva Henry on 06/13/2020 with well-controlled virus and good adherence and tolerance to her ART regimen of Triumeq.  Currently in the Reprieve and [employer] studies. Here today for routine follow up.  Barbara Henry continues to take her Triumeq daily as prescribed with no adverse side effects.  Overall feeling well today with no new concerns/complaints. Denies fevers, chills, night sweats, headaches, changes in vision, neck pain/stiffness, nausea, diarrhea, vomiting, lesions or rashes.  Barbara Henry has no problems obtaining medication from the pharmacy and remains covered by [employer].  Denies feelings of being down, depressed, or hopeless recently.  No current recreational illicit drug use, tobacco use, or alcohol consumption.  Condoms offered.  Healthcare maintenance due includes COVID booster and influenza vaccine.  No Known Allergies    Outpatient Medications Prior to Visit  Medication Sig Dispense Refill   aspirin 325 MG tablet Take 325 mg by mouth once.     Study - REPRIEVE [id number] - pitavastatin 4 mg or placebo tablet ([place]) Take one tablet by mouth once daily.  [id number] L ACTG A5332 90 tablet 0   abacavir-dolutegravir-lamiVUDine (TRIUMEQ) 600-50-300 MG tablet Take 1 tablet by mouth daily. 30 tablet 2   colchicine 0.6 MG tablet With gout flare take 1 tab q 8 hrs on day one, then take 1 tab TWICE DAILY until see Dr. Daiva Henry again (Patient not taking: Reported on 05/03/2021) 60 tablet 3   probenecid (BENEMID) 500 MG tablet TAKE 1 TABLET BY MOUTH TWICE DAILY (Patient not taking: Reported on 05/03/2021) 60 tablet 4   No facility-administered medications prior to visit.     Past Medical History:  Diagnosis Date   Anemia    Gout 05/15/2018   Gout  attack 02/22/2016   Hepatitis C without hepatic coma    History of hepatitis C 02/01/2015   HIV (human immunodeficiency virus infection) (HCC)    HTN (hypertension) 02/19/2017   HTN, white coat 02/01/2015   Low back pain 07/27/2019   Muscle spasm 01/10/2020   Shingles      History reviewed. No pertinent surgical history.    Review of Systems  Constitutional:  Negative for appetite change, chills, diaphoresis, fatigue, fever and unexpected weight change.  Eyes:        Negative for acute change in vision  Respiratory:  Negative for chest tightness, shortness of breath and wheezing.   Cardiovascular:  Negative for chest pain.  Gastrointestinal:  Negative for diarrhea, nausea and vomiting.  Genitourinary:  Negative for dysuria, pelvic pain and vaginal discharge.  Musculoskeletal:  Negative for neck pain and neck stiffness.  Skin:  Negative for rash.  Neurological:  Negative for seizures, syncope, weakness and headaches.  Hematological:  Negative for adenopathy. Does not bruise/bleed easily.  Psychiatric/Behavioral:  Negative for hallucinations.      Objective:    BP (!) 143/95 (BP Location: Right Arm)   Pulse 71   Temp (!) 97.5 F (36.4 C) (Oral)   Wt 176 lb (79.8 kg)   LMP 03/15/2013   BMI 27.57 kg/m  Nursing note and vital signs reviewed.  Physical Exam Constitutional:      General: She is not in acute distress.  Appearance: She is well-developed.  Eyes:     Conjunctiva/sclera: Conjunctivae normal.  Cardiovascular:     Rate and Rhythm: Normal rate and regular rhythm.     Heart sounds: Normal heart sounds. No murmur heard.   No friction rub. No gallop.  Pulmonary:     Effort: Pulmonary effort is normal. No respiratory distress.     Breath sounds: Normal breath sounds. No wheezing or rales.  Chest:     Chest wall: No tenderness.  Abdominal:     General: Bowel sounds are normal.     Palpations: Abdomen is soft.     Tenderness: There is no abdominal tenderness.   Musculoskeletal:     Cervical back: Neck supple.  Lymphadenopathy:     Cervical: No cervical adenopathy.  Skin:    General: Skin is warm and dry.     Findings: No rash.  Neurological:     Mental Status: She is alert and oriented to person, place, and time.  Psychiatric:        Behavior: Behavior normal.        Thought Content: Thought content normal.        Judgment: Judgment normal.     Depression screen [facility] 2/9 05/03/2021 02/22/2020 01/10/2020 01/18/2019 12/30/2017  Decreased Interest 0 0 0 0 0  Down, Depressed, Hopeless 0 0 0 0 0  PHQ - 2 Score 0 0 0 0 0       Assessment & Plan:    Patient Active Problem List   Diagnosis Date Noted   Cramps of left lower extremity 02/22/2020   Healthcare maintenance 02/22/2020   Muscle spasm 01/10/2020   Low back pain 07/27/2019   Gout 05/15/2018   Bilateral lower extremity edema 12/30/2017   Claudication (HCC) 12/30/2017   HTN (hypertension) 02/19/2017   Gout attack 02/22/2016   HTN, white coat 02/01/2015   History of hepatitis C 02/01/2015   Anemia 01/24/2014   Anemia, iron deficiency 01/24/2014   Rash and nonspecific skin eruption 06/09/2013   Vaginal discharge 01/22/2012   Otitis media of left ear 07/16/2011   Headache 07/16/2011   Nausea & vomiting 07/16/2011   Nausea 04/29/2011   Swelling of left knee joint 11/29/2010   HYPERLIPIDEMIA 10/04/2009   ACUTE SINUSITIS, UNSPECIFIED 06/19/2009   SKIN RASH 03/23/2009   URGENCY OF URINATION 03/06/2009   Human immunodeficiency virus (HIV) disease (HCC) 11/14/2008   HEPATITIS C 11/14/2008   Allergic rhinitis 11/14/2008     Problem List Items Addressed This Visit       Other   Human immunodeficiency virus (HIV) disease (HCC) - Primary    [name] appears to have well-controlled virus with good adherence and tolerance to her ART regimen of Triumeq.  No signs/symptoms of opportunistic infection.  We discussed plan of care to include blood work today and continuing current dose  of Triumeq.  Need to renew financial assistance after January 1.  Plan for follow-up in 6 months or sooner if needed with lab work on the same day.      Relevant Medications   abacavir-dolutegravir-lamiVUDine (TRIUMEQ) 600-50-300 MG tablet   Other Relevant Orders   COMPLETE METABOLIC PANEL WITH GFR   T-helper cell (CD4)- ([facility] clinic only)   HIV-1 RNA quant-no reflex-bld   Healthcare maintenance    Discussed importance of safe sexual practice and condom usage.  Condoms offered. Influenza vaccine updated. Discussed/recommended COVID booster through local pharmacy      Other Visit Diagnoses     Pharmacologic  therapy       Relevant Orders   Lipid Profile   Need for immunization against influenza       Relevant Orders   Flu Vaccine QUAD 57mo+IM (Fluarix, Fluzone & Alfiuria Quad PF) (Completed)        I have discontinued Barbara Henry's probenecid and colchicine. I am also having her maintain her aspirin, REPRIEVE A5332 pitavastatin or placebo, and Triumeq.   Meds ordered this encounter  Medications   abacavir-dolutegravir-lamiVUDine (TRIUMEQ) 600-50-300 MG tablet    Sig: Take 1 tablet by mouth daily.    Dispense:  30 tablet    Refill:  4    Order Specific Question:   Supervising Provider    Answer:   Judyann Munson [[id number]]     Follow-up: Return in about 6 months (around 11/01/2021).   Marcos Eke, MSN, FNP-C Nurse Practitioner [facility] Main number: [phone]

## 2021-05-03 NOTE — Assessment & Plan Note (Signed)
   Discussed importance of safe sexual practice and condom usage.  Condoms offered.  Influenza vaccine updated.  Discussed/recommended COVID booster through local pharmacy

## 2021-05-04 LAB — T-HELPER CELL (CD4) - (RCID CLINIC ONLY)
CD4 % Helper T Cell: 47 % (ref 33–65)
CD4 T Cell Abs: 677 /uL (ref 400–1790)

## 2021-05-05 LAB — LIPID PANEL
Cholesterol: 191 mg/dL (ref <200)
HDL: 85 mg/dL (ref >=50)
LDL Cholesterol (Calc): 81 mg/dL (calc)
Non-HDL Cholesterol (Calc): 106 mg/dL (calc) (ref <130)
Total CHOL/HDL Ratio: 2.2 (calc) (ref <5.0)
Triglycerides: 145 mg/dL (ref <150)

## 2021-05-05 LAB — COMPLETE METABOLIC PANEL WITH GFR
AG Ratio: 1.5 (calc) (ref 1.0–2.5)
ALT: 25 U/L (ref 6–29)
AST: 33 U/L (ref 10–35)
Albumin: 4.4 g/dL (ref 3.6–5.1)
Alkaline phosphatase (APISO): 72 U/L (ref 37–153)
BUN: 7 mg/dL (ref 7–25)
CO2: 25 mmol/L (ref 20–32)
Calcium: 9.5 mg/dL (ref 8.6–10.4)
Chloride: 108 mmol/L (ref 98–110)
Creat: 0.87 mg/dL (ref 0.50–1.03)
Globulin: 3 g/dL (calc) (ref 1.9–3.7)
Glucose, Bld: 85 mg/dL (ref 65–99)
Potassium: 4.3 mmol/L (ref 3.5–5.3)
Sodium: 141 mmol/L (ref 135–146)
Total Bilirubin: 0.4 mg/dL (ref 0.2–1.2)
Total Protein: 7.4 g/dL (ref 6.1–8.1)
eGFR: 77 mL/min/{1.73_m2} (ref >=60)

## 2021-05-05 LAB — HIV-1 RNA QUANT-NO REFLEX-BLD
HIV 1 RNA Quant: 24 Copies/mL — ABNORMAL HIGH
HIV-1 RNA Quant, Log: 1.37 Log cps/mL — ABNORMAL HIGH

## 2021-06-08 ENCOUNTER — Other Ambulatory Visit: Payer: Self-pay

## 2021-06-08 ENCOUNTER — Ambulatory Visit: Payer: Self-pay

## 2021-08-08 ENCOUNTER — Encounter (INDEPENDENT_AMBULATORY_CARE_PROVIDER_SITE_OTHER): Payer: Self-pay | Admitting: *Deleted

## 2021-08-08 ENCOUNTER — Other Ambulatory Visit: Payer: Self-pay

## 2021-08-08 VITALS — BP 127/82 | HR 74 | Temp 98.0°F | Wt 177.1 lb

## 2021-08-08 DIAGNOSIS — Z006 Encounter for examination for normal comparison and control in clinical research program: Secondary | ICD-10-CM

## 2021-08-08 NOTE — Research (Signed)
Barbara Henry seen today for her month 67 Reprieve visit. No new complaints or medications. Verbalized excellent adherence with her Triumeq and study medication. Denied any muscle aches or weakness. She is scheduled to see Dr. Daiva Eves in June and will return in July for her next Reprieve study visit.  ?

## 2021-10-22 ENCOUNTER — Other Ambulatory Visit: Payer: Self-pay | Admitting: Family

## 2021-10-22 DIAGNOSIS — B20 Human immunodeficiency virus [HIV] disease: Secondary | ICD-10-CM

## 2021-11-05 ENCOUNTER — Ambulatory Visit: Payer: Self-pay | Admitting: Infectious Disease

## 2021-11-07 ENCOUNTER — Ambulatory Visit: Payer: No Typology Code available for payment source | Admitting: Infectious Disease

## 2021-11-09 ENCOUNTER — Ambulatory Visit: Payer: Self-pay | Admitting: Infectious Disease

## 2021-11-13 ENCOUNTER — Other Ambulatory Visit: Payer: Self-pay | Admitting: Family

## 2021-11-13 DIAGNOSIS — B20 Human immunodeficiency virus [HIV] disease: Secondary | ICD-10-CM

## 2021-11-27 ENCOUNTER — Other Ambulatory Visit: Payer: Self-pay

## 2021-11-27 ENCOUNTER — Ambulatory Visit (INDEPENDENT_AMBULATORY_CARE_PROVIDER_SITE_OTHER): Payer: Self-pay | Admitting: Infectious Disease

## 2021-11-27 ENCOUNTER — Encounter: Payer: Self-pay | Admitting: Infectious Disease

## 2021-11-27 VITALS — BP 131/84 | HR 75 | Temp 97.4°F | Ht 67.0 in | Wt 174.0 lb

## 2021-11-27 DIAGNOSIS — B20 Human immunodeficiency virus [HIV] disease: Secondary | ICD-10-CM

## 2021-11-27 DIAGNOSIS — M109 Gout, unspecified: Secondary | ICD-10-CM | POA: Insufficient documentation

## 2021-11-27 DIAGNOSIS — I10 Essential (primary) hypertension: Secondary | ICD-10-CM

## 2021-11-27 DIAGNOSIS — R220 Localized swelling, mass and lump, head: Secondary | ICD-10-CM

## 2021-11-27 HISTORY — DX: Localized swelling, mass and lump, head: R22.0

## 2021-11-27 HISTORY — DX: Gout, unspecified: M10.9

## 2021-11-27 MED ORDER — PREDNISONE 20 MG PO TABS: 40.0000 mg | ORAL_TABLET | Freq: Every day | ORAL | 0 refills | Status: DC

## 2021-11-27 MED ORDER — DOVATO 50-300 MG PO TABS: 1.0000 | ORAL_TABLET | Freq: Every day | ORAL | 11 refills | Status: DC

## 2021-12-05 ENCOUNTER — Encounter: Payer: Self-pay | Admitting: Infectious Disease

## 2021-12-06 ENCOUNTER — Ambulatory Visit: Payer: Self-pay

## 2021-12-06 ENCOUNTER — Other Ambulatory Visit: Payer: Self-pay

## 2021-12-06 ENCOUNTER — Encounter (INDEPENDENT_AMBULATORY_CARE_PROVIDER_SITE_OTHER): Payer: Self-pay | Admitting: *Deleted

## 2021-12-06 VITALS — BP 118/80 | HR 59 | Temp 97.7°F | Wt 173.2 lb

## 2021-12-06 DIAGNOSIS — Z006 Encounter for examination for normal comparison and control in clinical research program: Secondary | ICD-10-CM

## 2021-12-06 NOTE — Research (Signed)
Barbara Henry here today for her final Reprieve study. Seen by Dr. Daiva Eves last week for gout flare up. States that he made some changes to her ARV's. She is currently taking Triumeq but will be switching to Dovato. Stated that she thinks her new medication is scheduled to be delivered today. States gout flare has resolved since completing prednisone. No new complaints. Verbalized interest in participating in additional studies in the future.

## 2021-12-14 ENCOUNTER — Encounter: Payer: No Typology Code available for payment source | Admitting: Student

## 2022-01-14 ENCOUNTER — Telehealth: Payer: Self-pay

## 2022-01-14 ENCOUNTER — Other Ambulatory Visit: Payer: Self-pay

## 2022-01-14 NOTE — Telephone Encounter (Signed)
Called patient to assist with rescheduling missed lab visit today.  Call went to voicemail. Staff left HIPPA compliant message asking patient to reschedule today's missed appointment.  Valarie Cones, LPN

## 2022-01-21 ENCOUNTER — Encounter: Payer: Self-pay | Admitting: Infectious Disease

## 2022-01-21 DIAGNOSIS — Z7185 Encounter for immunization safety counseling: Secondary | ICD-10-CM

## 2022-01-21 HISTORY — DX: Encounter for immunization safety counseling: Z71.85

## 2022-01-21 NOTE — Progress Notes (Unsigned)
Chief complaint: followup for HIV disease after switch from Triumeq to Dovato, Tanisa has been noticing some pain in her legs with her legs cramping up from time to time and also problems with migraine headaches and fullness in her ears Subjective:     Patient ID: Barbara Henry, female    DOB: [birth year], 61 y.o.   MRN: [id number]  HPI  61 year old female who is doing quite well on her current regimen TRIUMEQ with undetectable R load and healthy CD4 count.   Of note her past antiretroviral history had consisted of her being on Atripla with some very illogical failure but without documented resistance, with changed to Prezista Norvir Truvada then changed to TRIUMEQ to accommodate her hepatitis C therapy, now simplified to Dovato.  She is remained virologically suppressed.  She is participating in reprieve study and is not yet coming off the study but is going to next week.   She was treated for gout last time I saw her she is having pain in her lower extremities now though it does not sound as much like a gout flare.  She is also complaining of some ear fullness and indeed she has copious wax in both of her ears she also been struggling with problems with migraine headaches recently.    Past Medical History:  Diagnosis Date   Anemia    Gout 05/15/2018   Gout attack 02/22/2016   Gout flare 11/27/2021   Hepatitis C without hepatic coma    History of hepatitis C 02/01/2015   HIV (human immunodeficiency virus infection) (HCC)    HTN (hypertension) 02/19/2017   HTN, white coat 02/01/2015   Lip swelling 11/27/2021   Low back pain 07/27/2019   Muscle spasm 01/10/2020   Shingles     No past surgical history on file.  Family History  Problem Relation Age of Onset   Diabetes Sister    Diabetes Brother    Breast cancer Maternal Aunt 83   Breast cancer Maternal Aunt 79      Social History   Socioeconomic History   Marital status: Single    Spouse name: Not on file   Number of children:  4   Years of education: Not on file   Highest education level: 12th grade  Occupational History   Not on file  Tobacco Use   Smoking status: Never   Smokeless tobacco: Never  Vaping Use   Vaping Use: Never used  Substance and Sexual Activity   Alcohol use: Not Currently    Alcohol/week: 2.0 standard drinks of alcohol    Types: 2 Standard drinks or equivalent per week    Comment: beer   Drug use: No   Sexual activity: Yes    Partners: Male    Birth control/protection: Condom    Comment: declined condoms  Other Topics Concern   Not on file  Social History Narrative   Not on file   Social Determinants of Health   Financial Resource Strain: Not on file  Food Insecurity: Not on file  Transportation Needs: Unmet Transportation Needs (11/10/2018)   PRAPARE - Administrator, Civil Service (Medical): Yes    Lack of Transportation (Non-Medical): Yes  Physical Activity: Not on file  Stress: Not on file  Social Connections: Not on file    No Known Allergies   Current Outpatient Medications:    aspirin 325 MG tablet, Take 325 mg by mouth once., Disp: , Rfl:    dolutegravir-lamiVUDine (DOVATO) 50-300  MG tablet, Take 1 tablet by mouth daily., Disp: 30 tablet, Rfl: 11   Review of Systems  Constitutional:  Negative for activity change, appetite change, chills, diaphoresis, fatigue, fever and unexpected weight change.  HENT:  Negative for congestion, rhinorrhea, sinus pressure, sneezing, sore throat and trouble swallowing.   Eyes:  Negative for photophobia and visual disturbance.  Respiratory:  Negative for cough, chest tightness, shortness of breath, wheezing and stridor.   Cardiovascular:  Negative for chest pain, palpitations and leg swelling.  Gastrointestinal:  Negative for abdominal distention, abdominal pain, anal bleeding, blood in stool, constipation, diarrhea, nausea and vomiting.  Genitourinary:  Negative for difficulty urinating, dysuria, flank pain and  hematuria.  Musculoskeletal:  Positive for arthralgias. Negative for back pain, gait problem, joint swelling and myalgias.  Skin:  Negative for color change, pallor, rash and wound.  Neurological:  Positive for headaches. Negative for dizziness, tremors, weakness and light-headedness.  Hematological:  Negative for adenopathy. Does not bruise/bleed easily.  Psychiatric/Behavioral:  Negative for agitation, behavioral problems, confusion, decreased concentration, dysphoric mood and sleep disturbance.        Objective:   Physical Exam Vitals reviewed.  Constitutional:      Appearance: Normal appearance.  HENT:     Ears:     Comments: Wax buildup bilaterally Eyes:     Extraocular Movements: Extraocular movements intact.     Conjunctiva/sclera: Conjunctivae normal.  Cardiovascular:     Rate and Rhythm: Normal rate and regular rhythm.  Pulmonary:     Effort: Pulmonary effort is normal. No respiratory distress.     Breath sounds: No wheezing.  Abdominal:     General: Abdomen is flat. There is no distension.  Musculoskeletal:     Cervical back: Normal range of motion.  Skin:    General: Skin is warm and dry.  Neurological:     General: No focal deficit present.     Mental Status: She is alert and oriented to person, place, and time.  Psychiatric:        Mood and Affect: Mood normal.        Behavior: Behavior normal.        Thought Content: Thought content normal.        Judgment: Judgment normal.       Assessment & Plan:   HIV disease:  I am rechecking her viral load and CD4 count CBC and CMP  Continue her Dovato prescription   CV prevention Checking lipid panel I am starting pitavastatin  Ear wax buildup: We will obtain some over-the-counter irrigant's.  I also recommended trying an antihistamine  Migraine headaches defer to PCP   Vaccine counseling recommended updated COVID 19 and flu shot when available.

## 2022-01-22 ENCOUNTER — Ambulatory Visit (INDEPENDENT_AMBULATORY_CARE_PROVIDER_SITE_OTHER): Payer: Self-pay | Admitting: Infectious Disease

## 2022-01-22 ENCOUNTER — Other Ambulatory Visit: Payer: Self-pay

## 2022-01-22 ENCOUNTER — Encounter: Payer: Self-pay | Admitting: Infectious Disease

## 2022-01-22 VITALS — BP 145/90 | HR 80 | Temp 97.8°F | Ht 67.0 in | Wt 171.0 lb

## 2022-01-22 DIAGNOSIS — H6123 Impacted cerumen, bilateral: Secondary | ICD-10-CM

## 2022-01-22 DIAGNOSIS — M109 Gout, unspecified: Secondary | ICD-10-CM

## 2022-01-22 DIAGNOSIS — B20 Human immunodeficiency virus [HIV] disease: Secondary | ICD-10-CM

## 2022-01-22 DIAGNOSIS — R252 Cramp and spasm: Secondary | ICD-10-CM | POA: Insufficient documentation

## 2022-01-22 DIAGNOSIS — G43809 Other migraine, not intractable, without status migrainosus: Secondary | ICD-10-CM

## 2022-01-22 DIAGNOSIS — H6120 Impacted cerumen, unspecified ear: Secondary | ICD-10-CM

## 2022-01-22 DIAGNOSIS — G43909 Migraine, unspecified, not intractable, without status migrainosus: Secondary | ICD-10-CM

## 2022-01-22 DIAGNOSIS — E785 Hyperlipidemia, unspecified: Secondary | ICD-10-CM

## 2022-01-22 DIAGNOSIS — I10 Essential (primary) hypertension: Secondary | ICD-10-CM

## 2022-01-22 DIAGNOSIS — Z7185 Encounter for immunization safety counseling: Secondary | ICD-10-CM

## 2022-01-22 HISTORY — DX: Migraine, unspecified, not intractable, without status migrainosus: G43.909

## 2022-01-22 HISTORY — DX: Cramp and spasm: R25.2

## 2022-01-22 HISTORY — DX: Impacted cerumen, unspecified ear: H61.20

## 2022-01-22 MED ORDER — DOVATO 50-300 MG PO TABS: 1.0000 | ORAL_TABLET | Freq: Every day | ORAL | 11 refills | Status: DC

## 2022-01-22 MED ORDER — PITAVASTATIN MAGNESIUM 4 MG PO TABS: 4.0000 mg | ORAL_TABLET | Freq: Every day | ORAL | 11 refills | Status: DC

## 2022-01-23 LAB — T-HELPER CELLS (CD4) COUNT (NOT AT ARMC)
CD4 % Helper T Cell: 42 % (ref 33–65)
CD4 T Cell Abs: 382 /uL — ABNORMAL LOW (ref 400–1790)

## 2022-01-24 LAB — CBC WITH DIFFERENTIAL/PLATELET
Absolute Monocytes: 392 cells/uL (ref 200–950)
Basophils Absolute: 22 cells/uL (ref 0–200)
Basophils Relative: 0.5 %
Eosinophils Absolute: 40 cells/uL (ref 15–500)
Eosinophils Relative: 0.9 %
HCT: 42.9 % (ref 35.0–45.0)
Hemoglobin: 14.9 g/dL (ref 11.7–15.5)
Lymphs Abs: 1052 cells/uL (ref 850–3900)
MCH: 34.3 pg — ABNORMAL HIGH (ref 27.0–33.0)
MCHC: 34.7 g/dL (ref 32.0–36.0)
MCV: 98.6 fL (ref 80.0–100.0)
MPV: 11.1 fL (ref 7.5–12.5)
Monocytes Relative: 8.9 %
Neutro Abs: 2895 cells/uL (ref 1500–7800)
Neutrophils Relative %: 65.8 %
Platelets: 127 10*3/uL — ABNORMAL LOW (ref 140–400)
RBC: 4.35 10*6/uL (ref 3.80–5.10)
RDW: 13.6 % (ref 11.0–15.0)
Total Lymphocyte: 23.9 %
WBC: 4.4 10*3/uL (ref 3.8–10.8)

## 2022-01-24 LAB — COMPLETE METABOLIC PANEL WITH GFR
AG Ratio: 1.6 (calc) (ref 1.0–2.5)
ALT: 27 U/L (ref 6–29)
AST: 34 U/L (ref 10–35)
Albumin: 4.9 g/dL (ref 3.6–5.1)
Alkaline phosphatase (APISO): 81 U/L (ref 37–153)
BUN: 8 mg/dL (ref 7–25)
CO2: 22 mmol/L (ref 20–32)
Calcium: 9.9 mg/dL (ref 8.6–10.4)
Chloride: 102 mmol/L (ref 98–110)
Creat: 1.01 mg/dL (ref 0.50–1.03)
Globulin: 3.1 g/dL (calc) (ref 1.9–3.7)
Glucose, Bld: 108 mg/dL — ABNORMAL HIGH (ref 65–99)
Potassium: 3.8 mmol/L (ref 3.5–5.3)
Sodium: 137 mmol/L (ref 135–146)
Total Bilirubin: 1.7 mg/dL — ABNORMAL HIGH (ref 0.2–1.2)
Total Protein: 8 g/dL (ref 6.1–8.1)
eGFR: 64 mL/min/{1.73_m2} (ref >=60)

## 2022-01-24 LAB — LIPID PANEL
Cholesterol: 260 mg/dL — ABNORMAL HIGH (ref <200)
HDL: 129 mg/dL (ref >=50)
LDL Cholesterol (Calc): 112 mg/dL (calc) — ABNORMAL HIGH
Non-HDL Cholesterol (Calc): 131 mg/dL (calc) — ABNORMAL HIGH (ref <130)
Total CHOL/HDL Ratio: 2 (calc) (ref <5.0)
Triglycerides: 93 mg/dL (ref <150)

## 2022-01-24 LAB — HIV-1 RNA QUANT-NO REFLEX-BLD
HIV 1 RNA Quant: NOT DETECTED Copies/mL
HIV-1 RNA Quant, Log: NOT DETECTED Log cps/mL

## 2022-01-24 LAB — RPR: RPR Ser Ql: NONREACTIVE

## 2022-06-12 ENCOUNTER — Telehealth: Payer: Self-pay

## 2022-06-12 ENCOUNTER — Other Ambulatory Visit: Payer: Self-pay | Admitting: Infectious Disease

## 2022-06-12 MED ORDER — ATORVASTATIN CALCIUM 20 MG PO TABS: 20.0000 mg | ORAL_TABLET | Freq: Every day | ORAL | 11 refills | Status: DC

## 2022-06-12 NOTE — Telephone Encounter (Signed)
Please change Pitavastatin. Insurance does not cover.

## 2022-06-26 ENCOUNTER — Ambulatory Visit: Payer: Medicaid Other

## 2022-06-26 ENCOUNTER — Ambulatory Visit: Payer: No Typology Code available for payment source | Admitting: Infectious Disease

## 2022-07-01 ENCOUNTER — Other Ambulatory Visit (HOSPITAL_COMMUNITY): Payer: Self-pay | Admitting: Infectious Disease

## 2022-07-01 ENCOUNTER — Encounter (HOSPITAL_COMMUNITY): Payer: Self-pay | Admitting: Infectious Disease

## 2022-07-01 DIAGNOSIS — N644 Mastodynia: Secondary | ICD-10-CM

## 2022-07-01 DIAGNOSIS — Z1231 Encounter for screening mammogram for malignant neoplasm of breast: Secondary | ICD-10-CM

## 2022-07-18 ENCOUNTER — Ambulatory Visit (HOSPITAL_COMMUNITY)
Admission: RE | Admit: 2022-07-18 | Discharge: 2022-07-18 | Disposition: A | Discharge status: Home / Self Care | Payer: Medicaid Other | Source: Ambulatory Visit | Attending: Infectious Disease | Admitting: Infectious Disease

## 2022-07-18 DIAGNOSIS — N644 Mastodynia: Secondary | ICD-10-CM | POA: Insufficient documentation

## 2022-07-19 ENCOUNTER — Telehealth: Payer: Self-pay

## 2022-07-19 ENCOUNTER — Other Ambulatory Visit (HOSPITAL_COMMUNITY): Payer: Self-pay

## 2022-07-19 NOTE — Telephone Encounter (Signed)
[facility] Patient Teacher, English as a foreign language completed.    The patient is insured through [facility] Management and has a $0.00 copay.  Dovato last filled 02/07 Next fill 03/01  We will continue to follow to see if copay assistance is needed.  Ileene Patrick, [facility] Patient [facility] Phone: [phone] Fax:  [phone]

## 2022-07-22 NOTE — Progress Notes (Unsigned)
Chief complaint: For HIV disease on medications complaining of left-sided chronic knee pain Subjective:     Patient ID: Barbara Henry, female    DOB: 1960-10-16, 62 y.o.   MRN: [id number]  HPI  62  year old female who is doing quite well on her current regimen TRIUMEQ with undetectable R load and healthy CD4 count.   Of note her past antiretroviral history had consisted of her being on Atripla with some very illogical failure but without documented resistance, with changed to Prezista Norvir Truvada then changed to Flippin to accommodate her hepatitis C therapy, now simplified to Dovato.  She is remained virologically suppressed.  Is complaining of left-sided knee pain that bothers her as well as some muscle cramps.  She now has Medicaid so we can certainly refer her to [facility] which takes all patients including uninsured and Medicaid patients.    Past Medical History:  Diagnosis Date   Anemia    Excess ear wax 01/22/2022   Gout 05/15/2018   Gout attack 02/22/2016   Gout flare 11/27/2021   Hepatitis C without hepatic coma    History of hepatitis C 02/01/2015   HIV (human immunodeficiency virus infection) ([place])    HTN (hypertension) 02/19/2017   HTN, white coat 02/01/2015   Lip swelling 11/27/2021   Low back pain 07/27/2019   Migraine headache 01/22/2022   Muscle cramp 01/22/2022   Muscle spasm 01/10/2020   Shingles    Vaccine counseling 01/21/2022    No past surgical history on file.  Family History  Problem Relation Age of Onset   Diabetes Sister    Diabetes Brother    Breast cancer Maternal Aunt 50   Breast cancer Maternal Aunt 78      Social History   Socioeconomic History   Marital status: Single    Spouse name: Not on file   Number of children: 4   Years of education: Not on file   Highest education level: 12th grade  Occupational History   Not on file  Tobacco Use   Smoking status: Never   Smokeless tobacco: Never  Vaping Use   Vaping Use: Never  used  Substance and Sexual Activity   Alcohol use: Not Currently    Alcohol/week: 2.0 standard drinks of alcohol    Types: 2 Standard drinks or equivalent per week    Comment: beer   Drug use: No   Sexual activity: Yes    Partners: Male    Birth control/protection: Condom    Comment: declined condoms  Other Topics Concern   Not on file  Social History Narrative   Not on file   Social Determinants of Health   Financial Resource Strain: Not on file  Food Insecurity: Not on file  Transportation Needs: Unmet Transportation Needs (11/10/2018)   PRAPARE - Hydrologist (Medical): Yes    Lack of Transportation (Non-Medical): Yes  Physical Activity: Not on file  Stress: Not on file  Social Connections: Not on file    No Known Allergies   Current Outpatient Medications:    aspirin 325 MG tablet, Take 325 mg by mouth once. (Patient not taking: Reported on 01/22/2022), Disp: , Rfl:    atorvastatin (LIPITOR) 20 MG tablet, Take 1 tablet (20 mg total) by mouth daily., Disp: 30 tablet, Rfl: 11   dolutegravir-lamiVUDine (DOVATO) 50-300 MG tablet, Take 1 tablet by mouth daily., Disp: 30 tablet, Rfl: 11   Review of Systems  Constitutional:  Negative  for activity change, appetite change, chills, diaphoresis, fatigue, fever and unexpected weight change.  HENT:  Negative for congestion, rhinorrhea, sinus pressure, sneezing, sore throat and trouble swallowing.   Eyes:  Negative for photophobia and visual disturbance.  Respiratory:  Negative for cough, chest tightness, shortness of breath, wheezing and stridor.   Cardiovascular:  Negative for chest pain, palpitations and leg swelling.  Gastrointestinal:  Negative for abdominal distention, abdominal pain, anal bleeding, blood in stool, constipation, diarrhea, nausea and vomiting.  Genitourinary:  Negative for difficulty urinating, dysuria, flank pain and hematuria.  Musculoskeletal:  Negative for arthralgias, back pain,  gait problem, joint swelling and myalgias.  Skin:  Negative for color change, pallor, rash and wound.  Neurological:  Negative for dizziness, tremors, weakness and light-headedness.  Hematological:  Negative for adenopathy. Does not bruise/bleed easily.  Psychiatric/Behavioral:  Negative for agitation, behavioral problems, confusion, decreased concentration, dysphoric mood and sleep disturbance.        Objective:   Physical Exam HENT:     Head: Normocephalic and atraumatic.  Cardiovascular:     Rate and Rhythm: Normal rate and regular rhythm.  Pulmonary:     Effort: Pulmonary effort is normal. No respiratory distress.     Breath sounds: No wheezing.  Abdominal:     General: Abdomen is flat.     Palpations: Abdomen is soft.  Musculoskeletal:        General: Normal range of motion.  Skin:    General: Skin is warm and dry.  Neurological:     General: No focal deficit present.     Mental Status: She is alert and oriented to person, place, and time.  Psychiatric:        Mood and Affect: Mood normal.        Behavior: Behavior normal.        Thought Content: Thought content normal.        Judgment: Judgment normal.       Assessment & Plan:   HIV disease:  I will add order HIV viral load CD4 count CBC with differential CMP, RPR GC and chlamydia and I will continue  Danniell S Troupe's Dovato prescription   CV prevention and hyperlipidemia:  I am continuing her atorvastatin   Knee pain: I am prescribing her Mobic and referring her to [facility]o care [facility] for consideration of steroid injection and further modalities.     Vaccine counseling commended flu vaccine which she is received today

## 2022-07-23 ENCOUNTER — Ambulatory Visit (INDEPENDENT_AMBULATORY_CARE_PROVIDER_SITE_OTHER): Payer: Medicaid Other | Admitting: Infectious Disease

## 2022-07-23 ENCOUNTER — Other Ambulatory Visit (HOSPITAL_COMMUNITY): Payer: Self-pay

## 2022-07-23 ENCOUNTER — Other Ambulatory Visit: Payer: Self-pay

## 2022-07-23 ENCOUNTER — Encounter: Payer: Self-pay | Admitting: Infectious Disease

## 2022-07-23 VITALS — BP 129/82 | HR 81 | Temp 97.7°F | Ht 67.0 in | Wt 172.2 lb

## 2022-07-23 DIAGNOSIS — Z23 Encounter for immunization: Secondary | ICD-10-CM | POA: Diagnosis not present

## 2022-07-23 DIAGNOSIS — E785 Hyperlipidemia, unspecified: Secondary | ICD-10-CM

## 2022-07-23 DIAGNOSIS — M25562 Pain in left knee: Secondary | ICD-10-CM | POA: Diagnosis not present

## 2022-07-23 DIAGNOSIS — Z7185 Encounter for immunization safety counseling: Secondary | ICD-10-CM | POA: Diagnosis not present

## 2022-07-23 DIAGNOSIS — G8929 Other chronic pain: Secondary | ICD-10-CM | POA: Diagnosis not present

## 2022-07-23 DIAGNOSIS — B20 Human immunodeficiency virus [HIV] disease: Secondary | ICD-10-CM | POA: Diagnosis not present

## 2022-07-23 MED ORDER — ATORVASTATIN CALCIUM 20 MG PO TABS
20.0000 mg | ORAL_TABLET | Freq: Every day | ORAL | 11 refills | Status: DC
  Filled 2022-07-23: qty 30, 30d supply, fill #0

## 2022-07-23 MED ORDER — MELOXICAM 15 MG PO TABS
15.0000 mg | ORAL_TABLET | Freq: Every day | ORAL | 2 refills | Status: DC
  Filled 2022-07-23 – 2022-07-24 (×2): qty 30, 30d supply, fill #0
  Filled 2022-08-26 – 2022-09-19 (×2): qty 30, 30d supply, fill #1

## 2022-07-23 MED ORDER — DOVATO 50-300 MG PO TABS
1.0000 | ORAL_TABLET | Freq: Every day | ORAL | 11 refills | Status: DC
  Filled 2022-07-23 – 2022-08-01 (×3): qty 30, 30d supply, fill #0
  Filled 2022-08-26: qty 30, 30d supply, fill #1
  Filled 2022-11-21 (×4): qty 30, 30d supply, fill #2
  Filled 2022-12-18: qty 30, 30d supply, fill #3

## 2022-07-23 NOTE — Addendum Note (Signed)
Addended by: Caffie Pinto on: 07/23/2022 09:34 AM   Modules accepted: Orders

## 2022-07-24 ENCOUNTER — Other Ambulatory Visit (HOSPITAL_COMMUNITY): Payer: Self-pay

## 2022-07-24 LAB — T-HELPER CELLS (CD4) COUNT (NOT AT ARMC)
CD4 % Helper T Cell: 43 % (ref 33–65)
CD4 T Cell Abs: 432 /uL (ref 400–1790)

## 2022-07-25 ENCOUNTER — Other Ambulatory Visit (HOSPITAL_COMMUNITY): Payer: Self-pay

## 2022-07-25 LAB — COMPLETE METABOLIC PANEL WITH GFR
AG Ratio: 1.6 (calc) (ref 1.0–2.5)
ALT: 23 U/L (ref 6–29)
AST: 32 U/L (ref 10–35)
Albumin: 4.5 g/dL (ref 3.6–5.1)
Alkaline phosphatase (APISO): 85 U/L (ref 37–153)
BUN: 11 mg/dL (ref 7–25)
CO2: 23 mmol/L (ref 20–32)
Calcium: 9.4 mg/dL (ref 8.6–10.4)
Chloride: 104 mmol/L (ref 98–110)
Creat: 0.75 mg/dL (ref 0.50–1.05)
Globulin: 2.9 g/dL (calc) (ref 1.9–3.7)
Glucose, Bld: 86 mg/dL (ref 65–99)
Potassium: 3.9 mmol/L (ref 3.5–5.3)
Sodium: 139 mmol/L (ref 135–146)
Total Bilirubin: 0.7 mg/dL (ref 0.2–1.2)
Total Protein: 7.4 g/dL (ref 6.1–8.1)
eGFR: 91 mL/min/{1.73_m2} (ref >=60)

## 2022-07-25 LAB — LIPID PANEL
Cholesterol: 187 mg/dL (ref <200)
HDL: 88 mg/dL (ref >=50)
LDL Cholesterol (Calc): 76 mg/dL (calc)
Non-HDL Cholesterol (Calc): 99 mg/dL (calc) (ref <130)
Total CHOL/HDL Ratio: 2.1 (calc) (ref <5.0)
Triglycerides: 143 mg/dL (ref <150)

## 2022-07-25 LAB — CBC WITH DIFFERENTIAL/PLATELET
Absolute Monocytes: 434 cells/uL (ref 200–950)
Basophils Absolute: 19 cells/uL (ref 0–200)
Basophils Relative: 0.6 %
Eosinophils Absolute: 31 cells/uL (ref 15–500)
Eosinophils Relative: 1 %
HCT: 39.2 % (ref 35.0–45.0)
Hemoglobin: 13.5 g/dL (ref 11.7–15.5)
Lymphs Abs: 1166 cells/uL (ref 850–3900)
MCH: 33.3 pg — ABNORMAL HIGH (ref 27.0–33.0)
MCHC: 34.4 g/dL (ref 32.0–36.0)
MCV: 96.8 fL (ref 80.0–100.0)
MPV: 10.5 fL (ref 7.5–12.5)
Monocytes Relative: 14 %
Neutro Abs: 1451 cells/uL — ABNORMAL LOW (ref 1500–7800)
Neutrophils Relative %: 46.8 %
Platelets: 173 10*3/uL (ref 140–400)
RBC: 4.05 10*6/uL (ref 3.80–5.10)
RDW: 12.3 % (ref 11.0–15.0)
Total Lymphocyte: 37.6 %
WBC: 3.1 10*3/uL — ABNORMAL LOW (ref 3.8–10.8)

## 2022-07-25 LAB — HIV-1 RNA QUANT-NO REFLEX-BLD
HIV 1 RNA Quant: NOT DETECTED Copies/mL
HIV-1 RNA Quant, Log: NOT DETECTED Log cps/mL

## 2022-07-25 LAB — RPR: RPR Ser Ql: NONREACTIVE

## 2022-07-27 ENCOUNTER — Other Ambulatory Visit (HOSPITAL_COMMUNITY): Payer: Self-pay

## 2022-07-30 ENCOUNTER — Other Ambulatory Visit (HOSPITAL_COMMUNITY): Payer: Self-pay

## 2022-08-01 ENCOUNTER — Other Ambulatory Visit (HOSPITAL_COMMUNITY): Payer: Self-pay

## 2022-08-01 ENCOUNTER — Other Ambulatory Visit: Payer: Self-pay

## 2022-08-22 ENCOUNTER — Ambulatory Visit: Payer: Medicaid Other | Admitting: Orthopaedic Surgery

## 2022-08-22 ENCOUNTER — Other Ambulatory Visit (INDEPENDENT_AMBULATORY_CARE_PROVIDER_SITE_OTHER): Payer: Medicaid Other

## 2022-08-22 ENCOUNTER — Encounter: Payer: Self-pay | Admitting: Orthopaedic Surgery

## 2022-08-22 VITALS — Ht 67.0 in | Wt 172.0 lb

## 2022-08-22 DIAGNOSIS — M25562 Pain in left knee: Secondary | ICD-10-CM | POA: Diagnosis not present

## 2022-08-22 DIAGNOSIS — M659 Synovitis and tenosynovitis, unspecified: Secondary | ICD-10-CM

## 2022-08-22 DIAGNOSIS — G8929 Other chronic pain: Secondary | ICD-10-CM

## 2022-08-22 MED ORDER — METHYLPREDNISOLONE ACETATE 40 MG/ML IJ SUSP
40.0000 mg | INTRAMUSCULAR | Status: AC | PRN
  Administered 2022-08-22: 40 mg via INTRA_ARTICULAR

## 2022-08-22 MED ORDER — BUPIVACAINE HCL 0.5 % IJ SOLN
3.0000 mL | INTRAMUSCULAR | Status: AC | PRN
  Administered 2022-08-22: 3 mL via INTRA_ARTICULAR

## 2022-08-22 MED ORDER — LIDOCAINE HCL 1 % IJ SOLN
0.5000 mL | INTRAMUSCULAR | Status: AC | PRN
  Administered 2022-08-22: .5 mL

## 2022-08-22 NOTE — Progress Notes (Signed)
Office Visit Note   Patient: Barbara Henry           Date of Birth: 07/25/1960           MRN: [id number] Visit Date: 08/22/2022              Requested by: Tommy Medal, Lavell Islam, MD [address],  [place] [address] PCP: Gaylan Gerold, DO   Assessment & Plan: Visit Diagnoses:  1. Chronic pain of left knee   2. Synovitis of left knee     Plan: Injection performed left knee.  If she is not better on return we will obtain repeat Bmet and uric acid.  Follow-Up Instructions: No follow-ups on file.   Orders:  Orders Placed This Encounter  Procedures   Large Joint Inj   XR KNEE 3 VIEW LEFT   No orders of the defined types were placed in this encounter.     Procedures: Large Joint Inj: L knee on 08/22/2022 11:17 AM Indications: joint swelling and pain Details: 22 G 1.5 in needle, anterolateral approach  Arthrogram: No  Medications: 0.5 mL lidocaine 1 %; 3 mL bupivacaine 0.5 %; 40 mg methylPREDNISolone acetate 40 MG/ML Outcome: tolerated well, no immediate complications Procedure, treatment alternatives, risks and benefits explained, specific risks discussed. Consent was given by the patient. Immediately prior to procedure a time out was called to verify the correct patient, procedure, equipment, support staff and site/side marked as required. Patient was prepped and draped in the usual sterile fashion.       Clinical Data: No additional findings.   Subjective: Chief Complaint  Patient presents with   Left Knee - Pain    HPI 62 year old female positive HIV on Dovato is seen with left knee pain and limping.  She has had difficulty walking.  She has been on Mobic with slight relief.  She has some trouble sleeping.  Patient has short stride gait and pronounced left knee limp x 3 months.  Positive history of gout.  Treated in the past with colchicine.  No history of using allopurinol.  Review of Systems on Dovato medication.  No history of IV drug abuse.  She  takes medicine for cholesterol hypertension.   Objective: Vital Signs: Ht 5\' 7"  (1.702 m)   Wt 172 lb (78 kg)   LMP 03/15/2013   BMI 26.94 kg/m   Physical Exam Constitutional:      Appearance: She is well-developed.  HENT:     Head: Normocephalic.     Right Ear: External ear normal.     Left Ear: External ear normal. There is no impacted cerumen.  Eyes:     Pupils: Pupils are equal, round, and reactive to light.  Neck:     Thyroid: No thyromegaly.     Trachea: No tracheal deviation.  Cardiovascular:     Rate and Rhythm: Normal rate.  Pulmonary:     Effort: Pulmonary effort is normal.  Abdominal:     Palpations: Abdomen is soft.  Musculoskeletal:     Cervical back: No rigidity.  Skin:    General: Skin is warm and dry.  Neurological:     Mental Status: She is alert and oriented to person, place, and time.  Psychiatric:        Behavior: Behavior normal.     Ortho Exam patient has significant tenderness with palpation all about the knee multiple areas and is very sensitive.  Negative logroll the hips.  No abnormal patellar tracking.  Minimal crepitus with patellar loading quadriceps contracture.  Extremely slow deliberate gait with pronounced left knee limp.  Good ankle range of motion.  Knee is not warm.  No tenderness to ankle first MTP joint right or left.  Specialty Comments:  No specialty comments available.  Imaging: XR KNEE 3 VIEW LEFT  Result Date: 08/22/2022 Standing AP both knees lateral left knee sunrise patella x-rays obtained and reviewed this shows trace joint narrowing tiny osteophytes consistent with trace osteoarthritic changes.  No knee effusion is noted bone anatomy is normal. Impression: Normal left knee radiographs only noted this tiny osteophytes.    PMFS History: Patient Active Problem List   Diagnosis Date Noted   Synovitis of left knee 08/22/2022   Muscle cramp 01/22/2022   Excess ear wax 01/22/2022   Migraine headache 01/22/2022   Vaccine  counseling 01/21/2022   Cramps of left lower extremity 02/22/2020   Healthcare maintenance 02/22/2020   Gout 05/15/2018   Bilateral lower extremity edema 12/30/2017   HTN, white coat 02/01/2015   Headache 07/16/2011   Hyperlipidemia 10/04/2009   Human immunodeficiency virus (HIV) disease ([facility]) 11/14/2008   HEPATITIS C 11/14/2008   Past Medical History:  Diagnosis Date   Anemia    Excess ear wax 01/22/2022   Gout 05/15/2018   Gout attack 02/22/2016   Gout flare 11/27/2021   Hepatitis C without hepatic coma    History of hepatitis C 02/01/2015   HIV (human immunodeficiency virus infection) ([facility])    HTN (hypertension) 02/19/2017   HTN, white coat 02/01/2015   Lip swelling 11/27/2021   Low back pain 07/27/2019   Migraine headache 01/22/2022   Muscle cramp 01/22/2022   Muscle spasm 01/10/2020   Shingles    Vaccine counseling 01/21/2022    Family History  Problem Relation Age of Onset   Diabetes Sister    Diabetes Brother    Breast cancer Maternal Aunt 50   Breast cancer Maternal Aunt 50    No past surgical history on file. Social History   Occupational History   Not on file  Tobacco Use   Smoking status: Never   Smokeless tobacco: Never  Vaping Use   Vaping Use: Never used  Substance and Sexual Activity   Alcohol use: Not Currently    Alcohol/week: 2.0 standard drinks of alcohol    Types: 2 Standard drinks or equivalent per week    Comment: beer   Drug use: No   Sexual activity: Yes    Partners: Male    Birth control/protection: Condom    Comment: declined condoms

## 2022-08-23 ENCOUNTER — Other Ambulatory Visit (HOSPITAL_COMMUNITY): Payer: Self-pay

## 2022-08-26 ENCOUNTER — Other Ambulatory Visit: Payer: Self-pay

## 2022-08-26 ENCOUNTER — Other Ambulatory Visit (HOSPITAL_COMMUNITY): Payer: Self-pay

## 2022-08-28 ENCOUNTER — Other Ambulatory Visit (HOSPITAL_COMMUNITY): Payer: Self-pay

## 2022-09-03 ENCOUNTER — Other Ambulatory Visit (HOSPITAL_COMMUNITY): Payer: Self-pay

## 2022-09-17 ENCOUNTER — Other Ambulatory Visit (HOSPITAL_COMMUNITY): Payer: Self-pay

## 2022-09-19 ENCOUNTER — Other Ambulatory Visit (HOSPITAL_COMMUNITY): Payer: Self-pay

## 2022-09-27 ENCOUNTER — Other Ambulatory Visit (HOSPITAL_COMMUNITY): Payer: Self-pay

## 2022-11-21 ENCOUNTER — Other Ambulatory Visit (HOSPITAL_COMMUNITY): Payer: Self-pay

## 2022-11-21 ENCOUNTER — Other Ambulatory Visit: Payer: Self-pay

## 2022-12-16 ENCOUNTER — Other Ambulatory Visit (HOSPITAL_COMMUNITY): Payer: Self-pay

## 2022-12-18 ENCOUNTER — Other Ambulatory Visit (HOSPITAL_COMMUNITY): Payer: Self-pay

## 2022-12-18 ENCOUNTER — Other Ambulatory Visit: Payer: Self-pay

## 2023-01-02 ENCOUNTER — Other Ambulatory Visit (HOSPITAL_COMMUNITY): Payer: Self-pay

## 2023-01-08 ENCOUNTER — Other Ambulatory Visit (HOSPITAL_COMMUNITY): Payer: Self-pay

## 2023-01-10 ENCOUNTER — Other Ambulatory Visit (HOSPITAL_COMMUNITY): Payer: Self-pay

## 2023-01-13 ENCOUNTER — Other Ambulatory Visit (HOSPITAL_COMMUNITY): Payer: Self-pay

## 2023-01-14 ENCOUNTER — Other Ambulatory Visit: Payer: Self-pay

## 2023-01-21 ENCOUNTER — Encounter: Payer: Self-pay | Admitting: Infectious Disease

## 2023-01-21 ENCOUNTER — Other Ambulatory Visit (HOSPITAL_COMMUNITY): Payer: Self-pay

## 2023-01-21 ENCOUNTER — Other Ambulatory Visit (HOSPITAL_COMMUNITY)
Admission: RE | Admit: 2023-01-21 | Discharge: 2023-01-21 | Disposition: A | Discharge status: Home / Self Care | Payer: Medicaid Other | Source: Ambulatory Visit | Attending: Infectious Disease | Admitting: Infectious Disease

## 2023-01-21 ENCOUNTER — Other Ambulatory Visit: Payer: Self-pay

## 2023-01-21 ENCOUNTER — Ambulatory Visit (INDEPENDENT_AMBULATORY_CARE_PROVIDER_SITE_OTHER): Payer: Medicaid Other | Admitting: Infectious Disease

## 2023-01-21 VITALS — BP 119/80 | HR 72 | Temp 97.6°F | Wt 158.0 lb

## 2023-01-21 DIAGNOSIS — M65962 Unspecified synovitis and tenosynovitis, left lower leg: Secondary | ICD-10-CM

## 2023-01-21 DIAGNOSIS — J302 Other seasonal allergic rhinitis: Secondary | ICD-10-CM | POA: Insufficient documentation

## 2023-01-21 DIAGNOSIS — E785 Hyperlipidemia, unspecified: Secondary | ICD-10-CM

## 2023-01-21 DIAGNOSIS — M659 Synovitis and tenosynovitis, unspecified: Secondary | ICD-10-CM

## 2023-01-21 DIAGNOSIS — I10 Essential (primary) hypertension: Secondary | ICD-10-CM | POA: Diagnosis not present

## 2023-01-21 DIAGNOSIS — B20 Human immunodeficiency virus [HIV] disease: Secondary | ICD-10-CM | POA: Diagnosis present

## 2023-01-21 DIAGNOSIS — R220 Localized swelling, mass and lump, head: Secondary | ICD-10-CM

## 2023-01-21 HISTORY — DX: Other seasonal allergic rhinitis: J30.2

## 2023-01-21 MED ORDER — ATORVASTATIN CALCIUM 20 MG PO TABS
20.0000 mg | ORAL_TABLET | Freq: Every day | ORAL | 11 refills | Status: DC
  Filled 2023-01-21 – 2023-01-28 (×3): qty 30, 30d supply, fill #0

## 2023-01-21 MED ORDER — DOVATO 50-300 MG PO TABS
1.0000 | ORAL_TABLET | Freq: Every day | ORAL | 11 refills | Status: DC
  Filled 2023-01-21 – 2023-01-22 (×3): qty 30, 30d supply, fill #0
  Filled 2023-03-24: qty 30, 30d supply, fill #1
  Filled 2023-04-16: qty 30, 30d supply, fill #2
  Filled 2023-05-16: qty 30, 30d supply, fill #3
  Filled 2023-06-11: qty 30, 30d supply, fill #4
  Filled 2023-07-14: qty 30, 30d supply, fill #5

## 2023-01-21 NOTE — Progress Notes (Signed)
Chief complaint: Recent problems with her seasonal allergies and swelling of her face and lip Subjective:     Patient ID: Barbara Henry, female    DOB: 18-Oct-1960, 62 y.o.   MRN: [id number]  HPI  62  year old female who is doing quite well on her current regimen TRIUMEQ with undetectable R load and healthy CD4 count.   Of note her past antiretroviral history had consisted of her being on Atripla with some very illogical failure but without documented resistance, with changed to Prezista Norvir Truvada then changed to TRIUMEQ to accommodate her hepatitis C therapy, now simplified to Dovato.  She is remained virologically suppressed.  Barbara Henry developed some swelling of her face and upper lip recently which she attributes to her seasonal allergies.      Past Medical History:  Diagnosis Date   Anemia    Excess ear wax 01/22/2022   Gout 05/15/2018   Gout attack 02/22/2016   Gout flare 11/27/2021   Hepatitis C without hepatic coma    History of hepatitis C 02/01/2015   HIV (human immunodeficiency virus infection) ([facility])    HTN (hypertension) 02/19/2017   HTN, white coat 02/01/2015   Lip swelling 11/27/2021   Low back pain 07/27/2019   Migraine headache 01/22/2022   Muscle cramp 01/22/2022   Muscle spasm 01/10/2020   Shingles    Vaccine counseling 01/21/2022    No past surgical history on file.  Family History  Problem Relation Age of Onset   Diabetes Sister    Diabetes Brother    Breast cancer Maternal Aunt 45   Breast cancer Maternal Aunt 40      Social History   Socioeconomic History   Marital status: Single    Spouse name: Not on file   Number of children: 4   Years of education: Not on file   Highest education level: 12th grade  Occupational History   Not on file  Tobacco Use   Smoking status: Never   Smokeless tobacco: Never  Vaping Use   Vaping status: Never Used  Substance and Sexual Activity   Alcohol use: Not Currently    Alcohol/week: 2.0 standard drinks of  alcohol    Types: 2 Standard drinks or equivalent per week    Comment: beer   Drug use: No   Sexual activity: Yes    Partners: Male    Birth control/protection: Condom    Comment: declined condoms  Other Topics Concern   Not on file  Social History Narrative   Not on file   Social Determinants of Health   Financial Resource Strain: Not on file  Food Insecurity: Not on file  Transportation Needs: Unmet Transportation Needs (11/10/2018)   PRAPARE - Administrator, Civil Service (Medical): Yes    Lack of Transportation (Non-Medical): Yes  Physical Activity: Not on file  Stress: Not on file  Social Connections: Not on file    No Known Allergies   Current Outpatient Medications:    aspirin 325 MG tablet, Take 325 mg by mouth once., Disp: , Rfl:    atorvastatin (LIPITOR) 20 MG tablet, Take 1 tablet (20 mg total) by mouth daily., Disp: 30 tablet, Rfl: 11   dolutegravir-lamiVUDine (DOVATO) 50-300 MG tablet, Take 1 tablet by mouth daily., Disp: 30 tablet, Rfl: 11   meloxicam (MOBIC) 15 MG tablet, Take 1 tablet (15 mg total) by mouth daily., Disp: 30 tablet, Rfl: 2   Review of Systems  Constitutional:  Negative for activity  change, appetite change, chills, diaphoresis, fatigue, fever and unexpected weight change.  HENT:  Negative for congestion, rhinorrhea, sinus pressure, sneezing, sore throat and trouble swallowing.   Eyes:  Negative for photophobia and visual disturbance.  Respiratory:  Negative for cough, chest tightness, shortness of breath, wheezing and stridor.   Cardiovascular:  Negative for chest pain, palpitations and leg swelling.  Gastrointestinal:  Negative for abdominal distention, abdominal pain, anal bleeding, blood in stool, constipation, diarrhea, nausea and vomiting.  Genitourinary:  Negative for difficulty urinating, dysuria, flank pain and hematuria.  Musculoskeletal:  Negative for arthralgias, back pain, gait problem, joint swelling and myalgias.   Skin:  Negative for color change, pallor, rash and wound.  Allergic/Immunologic: Positive for environmental allergies.  Neurological:  Negative for dizziness, tremors, weakness and light-headedness.  Hematological:  Negative for adenopathy. Does not bruise/bleed easily.  Psychiatric/Behavioral:  Negative for agitation, behavioral problems, confusion, decreased concentration, dysphoric mood and sleep disturbance.        Objective:   Physical Exam HENT:     Head: Normocephalic and atraumatic.     Mouth/Throat:     Comments: Let side of lip swollen slightly Cardiovascular:     Rate and Rhythm: Normal rate and regular rhythm.  Pulmonary:     Effort: No respiratory distress.     Breath sounds: No wheezing.  Abdominal:     General: Abdomen is flat.  Musculoskeletal:        General: Normal range of motion.  Skin:    General: Skin is warm.  Neurological:     General: No focal deficit present.     Mental Status: She is alert and oriented to person, place, and time.  Psychiatric:        Mood and Affect: Mood normal.        Behavior: Behavior normal.        Thought Content: Thought content normal.        Judgment: Judgment normal.       Assessment & Plan:   Seasonal allergies apparently causing swelling of the lips and face: Referring her to allergy and immunology  HIV disease:  I will add order HIV viral load CD4 count CBC with differential CMP, RPR GC and chlamydia and I will continue  Barbara Henry's Dovato, prescription   Hyperlipidemia we will continue on Crestor

## 2023-01-22 ENCOUNTER — Other Ambulatory Visit (HOSPITAL_BASED_OUTPATIENT_CLINIC_OR_DEPARTMENT_OTHER): Payer: Self-pay

## 2023-01-22 ENCOUNTER — Other Ambulatory Visit: Payer: Self-pay

## 2023-01-22 ENCOUNTER — Other Ambulatory Visit (HOSPITAL_COMMUNITY): Payer: Self-pay

## 2023-01-22 LAB — URINE CYTOLOGY ANCILLARY ONLY
Chlamydia: NEGATIVE
Comment: NEGATIVE
Comment: NORMAL
Neisseria Gonorrhea: NEGATIVE

## 2023-01-22 LAB — T-HELPER CELLS (CD4) COUNT (NOT AT ARMC)
CD4 % Helper T Cell: 42 % (ref 33–65)
CD4 T Cell Abs: 391 /uL — ABNORMAL LOW (ref 400–1790)

## 2023-01-23 ENCOUNTER — Telehealth: Payer: Self-pay

## 2023-01-23 ENCOUNTER — Other Ambulatory Visit (HOSPITAL_COMMUNITY): Payer: Self-pay

## 2023-01-23 LAB — LIPID PANEL
Cholesterol: 219 mg/dL — ABNORMAL HIGH (ref <200)
HDL: 86 mg/dL (ref >=50)
LDL Cholesterol (Calc): 114 mg/dL — ABNORMAL HIGH
Non-HDL Cholesterol (Calc): 133 mg/dL — ABNORMAL HIGH (ref <130)
Total CHOL/HDL Ratio: 2.5 (calc) (ref <5.0)
Triglycerides: 93 mg/dL (ref <150)

## 2023-01-23 LAB — CBC WITH DIFFERENTIAL/PLATELET
Absolute Monocytes: 445 {cells}/uL (ref 200–950)
Basophils Absolute: 20 {cells}/uL (ref 0–200)
Basophils Relative: 0.6 %
Eosinophils Absolute: 41 {cells}/uL (ref 15–500)
Eosinophils Relative: 1.2 %
HCT: 39.7 % (ref 35.0–45.0)
Hemoglobin: 13.6 g/dL (ref 11.7–15.5)
Lymphs Abs: 949 {cells}/uL (ref 850–3900)
MCH: 33.1 pg — ABNORMAL HIGH (ref 27.0–33.0)
MCHC: 34.3 g/dL (ref 32.0–36.0)
MCV: 96.6 fL (ref 80.0–100.0)
MPV: 11 fL (ref 7.5–12.5)
Monocytes Relative: 13.1 %
Neutro Abs: 1945 {cells}/uL (ref 1500–7800)
Neutrophils Relative %: 57.2 %
Platelets: 131 10*3/uL — ABNORMAL LOW (ref 140–400)
RBC: 4.11 10*6/uL (ref 3.80–5.10)
RDW: 13.4 % (ref 11.0–15.0)
Total Lymphocyte: 27.9 %
WBC: 3.4 10*3/uL — ABNORMAL LOW (ref 3.8–10.8)

## 2023-01-23 LAB — COMPLETE METABOLIC PANEL WITH GFR
AG Ratio: 1.6 (calc) (ref 1.0–2.5)
ALT: 55 U/L — ABNORMAL HIGH (ref 6–29)
AST: 72 U/L — ABNORMAL HIGH (ref 10–35)
Albumin: 4.8 g/dL (ref 3.6–5.1)
Alkaline phosphatase (APISO): 81 U/L (ref 37–153)
BUN: 8 mg/dL (ref 7–25)
CO2: 25 mmol/L (ref 20–32)
Calcium: 10.4 mg/dL (ref 8.6–10.4)
Chloride: 102 mmol/L (ref 98–110)
Creat: 0.96 mg/dL (ref 0.50–1.05)
Globulin: 3 g/dL (ref 1.9–3.7)
Glucose, Bld: 105 mg/dL — ABNORMAL HIGH (ref 65–99)
Potassium: 4 mmol/L (ref 3.5–5.3)
Sodium: 137 mmol/L (ref 135–146)
Total Bilirubin: 1.4 mg/dL — ABNORMAL HIGH (ref 0.2–1.2)
Total Protein: 7.8 g/dL (ref 6.1–8.1)
eGFR: 68 mL/min/{1.73_m2} (ref >=60)

## 2023-01-23 LAB — HIV-1 RNA QUANT-NO REFLEX-BLD
HIV 1 RNA Quant: NOT DETECTED {copies}/mL
HIV-1 RNA Quant, Log: NOT DETECTED {Log_copies}/mL

## 2023-01-23 LAB — RPR: RPR Ser Ql: NONREACTIVE

## 2023-01-23 NOTE — Telephone Encounter (Signed)
Per Dr. Zenaida Niece dam called patient regarding elevated lever enzymes. Will need to ask patient if she consumed alcohol before labs were drawn. If not will need patient to come back for repeat labs. Number listed in chart are not in service.  Juanita Laster, RMA

## 2023-01-23 NOTE — Telephone Encounter (Signed)
-----   Message from [place] sent at 01/22/2023  4:51 PM EDT ----- Regarding: lfts up any recent alcohol use?  ----- Message ----- From: Janace Hoard Lab Results In Sent: 01/21/2023  11:19 PM EDT To: Randall Hiss, MD

## 2023-01-24 NOTE — Telephone Encounter (Signed)
Attempted to reach the patient. No VM setup

## 2023-01-25 ENCOUNTER — Other Ambulatory Visit (HOSPITAL_COMMUNITY): Payer: Self-pay

## 2023-01-27 NOTE — Telephone Encounter (Signed)
3rd attempt at staff trying to reach patient. Will notify patient via mail to contact our office in regards to recent labs.  Valarie Cones

## 2023-01-28 ENCOUNTER — Other Ambulatory Visit: Payer: Self-pay

## 2023-01-28 ENCOUNTER — Other Ambulatory Visit (HOSPITAL_COMMUNITY): Payer: Self-pay

## 2023-02-10 ENCOUNTER — Other Ambulatory Visit (HOSPITAL_COMMUNITY): Payer: Self-pay

## 2023-02-12 ENCOUNTER — Other Ambulatory Visit (HOSPITAL_COMMUNITY): Payer: Self-pay

## 2023-02-14 ENCOUNTER — Other Ambulatory Visit: Payer: Self-pay

## 2023-02-14 ENCOUNTER — Other Ambulatory Visit (HOSPITAL_COMMUNITY): Payer: Self-pay

## 2023-02-14 DIAGNOSIS — B20 Human immunodeficiency virus [HIV] disease: Secondary | ICD-10-CM

## 2023-02-14 NOTE — Telephone Encounter (Signed)
Patient returned call. Patient stated that she does "drink a beer every now and then". Patient stated she didn't drink prior to labs on 8/20. Patient agreeable to come in to repeat CMP. Scheduled on 9/30.   Theda Payer Lesli Albee, CMA

## 2023-02-19 ENCOUNTER — Telehealth: Payer: Self-pay

## 2023-02-19 NOTE — Telephone Encounter (Signed)
[facility] Patient Advocate Encounter  [facility] and I have been unsuccessful in reaching patient to be able to refill medication.      We have tried multiple times without a response.  Clearance Coots, CPhT Specialty Pharmacy Patient [facility] Phone: [phone] Fax:  [phone]

## 2023-03-03 ENCOUNTER — Other Ambulatory Visit: Payer: Medicaid Other

## 2023-03-03 ENCOUNTER — Other Ambulatory Visit: Payer: Self-pay

## 2023-03-03 DIAGNOSIS — B20 Human immunodeficiency virus [HIV] disease: Secondary | ICD-10-CM

## 2023-03-05 ENCOUNTER — Other Ambulatory Visit: Payer: Self-pay

## 2023-03-24 ENCOUNTER — Other Ambulatory Visit: Payer: Self-pay

## 2023-03-24 NOTE — Progress Notes (Signed)
Specialty Pharmacy Refill Coordination Note  Barbara Henry is a 62 y.o. female contacted today regarding refills of specialty medication(s) Dolutegravir-Lamivudine   Patient requested Delivery   Delivery date: 03/25/23   Verified address: [address]   [place] [address]   Medication will be filled on 03/24/23.

## 2023-04-10 ENCOUNTER — Other Ambulatory Visit: Payer: Self-pay

## 2023-04-14 ENCOUNTER — Other Ambulatory Visit (HOSPITAL_COMMUNITY): Payer: Self-pay

## 2023-04-16 ENCOUNTER — Other Ambulatory Visit (HOSPITAL_COMMUNITY): Payer: Self-pay

## 2023-04-16 ENCOUNTER — Other Ambulatory Visit (HOSPITAL_COMMUNITY): Payer: Self-pay | Admitting: Pharmacy Technician

## 2023-04-16 NOTE — Progress Notes (Signed)
Specialty Pharmacy Refill Coordination Note  Barbara Henry is a 62 y.o. female contacted today regarding refills of specialty medication(s) Dolutegravir-Lamivudine   Patient requested Delivery   Delivery date: 04/21/23   Verified address: [address] [place]   Medication will be filled on 04/18/23.

## 2023-04-18 ENCOUNTER — Other Ambulatory Visit: Payer: Self-pay

## 2023-05-12 ENCOUNTER — Other Ambulatory Visit (HOSPITAL_COMMUNITY): Payer: Self-pay

## 2023-05-16 ENCOUNTER — Other Ambulatory Visit: Payer: Self-pay

## 2023-05-16 ENCOUNTER — Other Ambulatory Visit (HOSPITAL_COMMUNITY): Payer: Self-pay

## 2023-05-16 NOTE — Progress Notes (Signed)
Specialty Pharmacy Refill Coordination Note  Barbara Henry is a 62 y.o. female contacted today regarding refills of specialty medication(s) Dolutegravir-lamiVUDine (Dovato)   Patient requested Delivery   Delivery date: 05/19/23   Verified address: [address]   [place] [address]   Medication will be filled on 05/16/23.

## 2023-06-09 ENCOUNTER — Other Ambulatory Visit: Payer: Self-pay

## 2023-06-11 ENCOUNTER — Other Ambulatory Visit: Payer: Self-pay

## 2023-06-11 NOTE — Progress Notes (Signed)
 Specialty Pharmacy Refill Coordination Note  Barbara Henry is a 63 y.o. female contacted today regarding refills of specialty medication(s) Dolutegravir -lamiVUDine  (Dovato )   Patient requested Delivery   Delivery date: 06/13/23   Verified address: [address] [place], [place] [address]   Medication will be filled on 06/12/2023.

## 2023-06-11 NOTE — Progress Notes (Signed)
 Specialty Pharmacy Ongoing Clinical Assessment Note  Barbara Henry is a 63 y.o. female who is being followed by the specialty pharmacy service for RxSp HIV   Patient's specialty medication(s) reviewed today: Dolutegravir -lamiVUDine  (Dovato )   Missed doses in the last 4 weeks: 0   Patient/Caregiver did not have any additional questions or concerns.   Therapeutic benefit summary: Patient is achieving benefit   Adverse events/side effects summary: No adverse events/side effects   Patient's therapy is appropriate to: Continue    Goals Addressed             This Visit's Progress    Achieve Undetectable HIV Viral Load < 20       Patient is on track. Patient will maintain adherence         Follow up:  6 months  Kenidi Elenbaas E Bevely Hackbart Specialty Pharmacist

## 2023-06-30 ENCOUNTER — Other Ambulatory Visit: Payer: Self-pay

## 2023-07-04 ENCOUNTER — Other Ambulatory Visit: Payer: Self-pay

## 2023-07-07 ENCOUNTER — Other Ambulatory Visit (HOSPITAL_COMMUNITY): Payer: Self-pay

## 2023-07-14 ENCOUNTER — Other Ambulatory Visit (HOSPITAL_COMMUNITY): Payer: Self-pay

## 2023-07-14 ENCOUNTER — Other Ambulatory Visit: Payer: Self-pay

## 2023-07-14 NOTE — Progress Notes (Signed)
 Specialty Pharmacy Refill Coordination Note  Barbara Henry is a 63 y.o. female contacted today regarding refills of specialty medication(s) Dolutegravir -lamiVUDine  (Dovato )   Patient requested Delivery   Delivery date: 07/16/23   Verified address: [address] [place] [address]   Medication will be filled on 07/15/23.

## 2023-07-27 ENCOUNTER — Encounter: Payer: Self-pay | Admitting: Infectious Disease

## 2023-07-27 DIAGNOSIS — B20 Human immunodeficiency virus [HIV] disease: Secondary | ICD-10-CM

## 2023-07-27 DIAGNOSIS — Z124 Encounter for screening for malignant neoplasm of cervix: Secondary | ICD-10-CM

## 2023-07-27 DIAGNOSIS — Z1211 Encounter for screening for malignant neoplasm of colon: Secondary | ICD-10-CM

## 2023-07-27 HISTORY — DX: Encounter for screening for malignant neoplasm of colon: Z12.11

## 2023-07-27 HISTORY — DX: Human immunodeficiency virus (HIV) disease: B20

## 2023-07-27 HISTORY — DX: Encounter for screening for malignant neoplasm of cervix: Z12.4

## 2023-07-28 ENCOUNTER — Other Ambulatory Visit: Payer: Self-pay

## 2023-07-28 ENCOUNTER — Encounter: Payer: Self-pay | Admitting: Infectious Disease

## 2023-07-28 ENCOUNTER — Ambulatory Visit (INDEPENDENT_AMBULATORY_CARE_PROVIDER_SITE_OTHER): Payer: Medicaid Other | Admitting: Infectious Disease

## 2023-07-28 ENCOUNTER — Other Ambulatory Visit (HOSPITAL_COMMUNITY): Payer: Self-pay

## 2023-07-28 VITALS — BP 138/76 | HR 86 | Temp 98.2°F | Wt 176.0 lb

## 2023-07-28 DIAGNOSIS — Z1211 Encounter for screening for malignant neoplasm of colon: Secondary | ICD-10-CM

## 2023-07-28 DIAGNOSIS — B20 Human immunodeficiency virus [HIV] disease: Secondary | ICD-10-CM

## 2023-07-28 DIAGNOSIS — E785 Hyperlipidemia, unspecified: Secondary | ICD-10-CM

## 2023-07-28 DIAGNOSIS — R609 Edema, unspecified: Secondary | ICD-10-CM | POA: Diagnosis not present

## 2023-07-28 DIAGNOSIS — J302 Other seasonal allergic rhinitis: Secondary | ICD-10-CM | POA: Diagnosis not present

## 2023-07-28 DIAGNOSIS — Z7185 Encounter for immunization safety counseling: Secondary | ICD-10-CM

## 2023-07-28 DIAGNOSIS — Z23 Encounter for immunization: Secondary | ICD-10-CM | POA: Diagnosis not present

## 2023-07-28 DIAGNOSIS — Z124 Encounter for screening for malignant neoplasm of cervix: Secondary | ICD-10-CM

## 2023-07-28 DIAGNOSIS — T783XXD Angioneurotic edema, subsequent encounter: Secondary | ICD-10-CM

## 2023-07-28 MED ORDER — DOVATO 50-300 MG PO TABS
1.0000 | ORAL_TABLET | Freq: Every day | ORAL | 11 refills | Status: DC
  Filled 2023-07-28 – 2023-08-08 (×2): qty 30, 30d supply, fill #0
  Filled 2023-09-08: qty 30, 30d supply, fill #1
  Filled 2023-09-29 (×2): qty 30, 30d supply, fill #2
  Filled 2023-10-23: qty 30, 30d supply, fill #3
  Filled 2023-11-27 – 2023-12-01 (×2): qty 30, 30d supply, fill #4

## 2023-07-28 MED ORDER — COVID-19 MRNA VAC-TRIS(PFIZER) 30 MCG/0.3ML IM SUSY
0.3000 mL | PREFILLED_SYRINGE | Freq: Once | INTRAMUSCULAR | 0 refills | Status: AC
  Filled 2023-07-28: qty 0.3, 1d supply, fill #0

## 2023-07-28 MED ORDER — ATORVASTATIN CALCIUM 20 MG PO TABS
20.0000 mg | ORAL_TABLET | Freq: Every day | ORAL | 11 refills | Status: DC
  Filled 2023-07-28: qty 30, 30d supply, fill #0

## 2023-07-28 NOTE — Patient Instructions (Signed)
 Appt with Judeth Cornfield for pap smear

## 2023-07-28 NOTE — Progress Notes (Signed)
 Subjective:   Chief complaint: follow-up for HIV disease on medications    Patient ID: Barbara Henry, female    DOB: [birth year], 63 y.o.   MRN: [id number]  HPI  Discussed the use of AI scribe software for clinical note transcription with the patient, who gave verbal consent to proceed.  History of Present Illness   The patient, with a history of HIV well controlled on Dovato, presents with recurrent facial swelling, specifically in the lip, and new onset ankle and foot swelling. The facial swelling is intermittent and has been previously attributed to allergies. The patient denies any known triggers and the swelling is not associated with any other symptoms. The ankle and foot swelling is also intermittent, resolving overnight and recurring in the morning. The patient denies any associated pain or other symptoms. The patient has no known allergies and is not currently on any medications known to cause angioedema, such as lisinopril.       Past Medical History:  Diagnosis Date   Anemia    Cervical cancer screening 07/27/2023   Colon cancer screening 07/27/2023   Excess ear wax 01/22/2022   Gout 05/15/2018   Gout attack 02/22/2016   Gout flare 11/27/2021   Hepatitis C without hepatic coma    History of hepatitis C 02/01/2015   HIV (human immunodeficiency virus infection) ([facility])    HIV disease ([facility]) 07/27/2023   HTN (hypertension) 02/19/2017   HTN, white coat 02/01/2015   Lip swelling 11/27/2021   Lip swelling 01/21/2023   Low back pain 07/27/2019   Migraine headache 01/22/2022   Muscle cramp 01/22/2022   Muscle spasm 01/10/2020   Seasonal allergies 01/21/2023   Shingles    Vaccine counseling 01/21/2022    No past surgical history on file.  Family History  Problem Relation Age of Onset   Diabetes Sister    Diabetes Brother    Breast cancer Maternal Aunt 20   Breast cancer Maternal Aunt 62      Social History   Socioeconomic History   Marital status: Single     Spouse name: Not on file   Number of children: 4   Years of education: Not on file   Highest education level: 12th grade  Occupational History   Not on file  Tobacco Use   Smoking status: Never   Smokeless tobacco: Never  Vaping Use   Vaping status: Never Used  Substance and Sexual Activity   Alcohol use: Not Currently    Alcohol/week: 2.0 standard drinks of alcohol    Types: 2 Standard drinks or equivalent per week    Comment: beer   Drug use: No   Sexual activity: Yes    Partners: Male    Birth control/protection: Condom    Comment: declined condoms  Other Topics Concern   Not on file  Social History Narrative   Not on file   Social Drivers of Health   Financial Resource Strain: Not on file  Food Insecurity: Not on file  Transportation Needs: Unmet Transportation Needs (11/10/2018)   PRAPARE - Administrator, Civil Service (Medical): Yes    Lack of Transportation (Non-Medical): Yes  Physical Activity: Not on file  Stress: Not on file  Social Connections: Not on file    No Known Allergies   Current Outpatient Medications:    aspirin 325 MG tablet, Take 325 mg by mouth once., Disp: , Rfl:    meloxicam (MOBIC) 15 MG tablet, Take 1 tablet (15 mg total)  by mouth daily., Disp: 30 tablet, Rfl: 2   atorvastatin (LIPITOR) 20 MG tablet, Take 1 tablet (20 mg total) by mouth daily., Disp: 30 tablet, Rfl: 11   COVID-19 mRNA vaccine, Pfizer, (COMIRNATY) syringe, Inject 0.3 mLs into the muscle once for 1 dose., Disp: 0.3 mL, Rfl: 0   dolutegravir-lamiVUDine (DOVATO) 50-300 MG tablet, Take 1 tablet by mouth daily., Disp: 30 tablet, Rfl: 11   Review of Systems  Constitutional:  Negative for activity change, appetite change, chills, diaphoresis, fatigue, fever and unexpected weight change.  HENT:  Negative for congestion, rhinorrhea, sinus pressure, sneezing, sore throat and trouble swallowing.   Eyes:  Negative for photophobia and visual disturbance.  Respiratory:   Negative for cough, chest tightness, shortness of breath, wheezing and stridor.   Cardiovascular:  Negative for chest pain, palpitations and leg swelling.  Gastrointestinal:  Negative for abdominal distention, abdominal pain, anal bleeding, blood in stool, constipation, diarrhea, nausea and vomiting.  Genitourinary:  Negative for difficulty urinating, dysuria, flank pain and hematuria.  Musculoskeletal:  Positive for myalgias. Negative for arthralgias, back pain, gait problem and joint swelling.  Skin:  Negative for color change, pallor, rash and wound.  Neurological:  Negative for dizziness, tremors, weakness and light-headedness.  Hematological:  Negative for adenopathy. Does not bruise/bleed easily.  Psychiatric/Behavioral:  Negative for agitation, behavioral problems, confusion, decreased concentration, dysphoric mood and sleep disturbance.        Objective:   Physical Exam Constitutional:      General: She is not in acute distress.    Appearance: Normal appearance. She is well-developed. She is not ill-appearing or diaphoretic.  HENT:     Head: Normocephalic and atraumatic.     Right Ear: Hearing and external ear normal.     Left Ear: Hearing and external ear normal.     Nose: No nasal deformity or rhinorrhea.  Eyes:     General: No scleral icterus.    Conjunctiva/sclera: Conjunctivae normal.     Right eye: Right conjunctiva is not injected.     Left eye: Left conjunctiva is not injected.     Pupils: Pupils are equal, round, and reactive to light.  Neck:     Vascular: No JVD.  Cardiovascular:     Rate and Rhythm: Normal rate and regular rhythm.     Heart sounds: Normal heart sounds, S1 normal and S2 normal. No murmur heard.    No friction rub.  Abdominal:     General: Bowel sounds are normal. There is no distension.     Palpations: Abdomen is soft.     Tenderness: There is no abdominal tenderness.  Musculoskeletal:        General: Normal range of motion.     Right  shoulder: Normal.     Left shoulder: Normal.     Cervical back: Normal range of motion and neck supple.     Right hip: Normal.     Left hip: Normal.     Right knee: Normal.     Left knee: Normal.     Comments: Tenderness in palms with grip  Lymphadenopathy:     Head:     Right side of head: No submandibular, preauricular or posterior auricular adenopathy.     Left side of head: No submandibular, preauricular or posterior auricular adenopathy.     Cervical: No cervical adenopathy.     Right cervical: No superficial or deep cervical adenopathy.    Left cervical: No superficial or deep cervical adenopathy.  Skin:  General: Skin is warm and dry.     Coloration: Skin is not pale.     Findings: No abrasion, bruising, ecchymosis, erythema, lesion or rash.     Nails: There is no clubbing.  Neurological:     General: No focal deficit present.     Mental Status: She is alert and oriented to person, place, and time.     Sensory: No sensory deficit.     Motor: No weakness.     Coordination: Coordination normal.     Gait: Gait normal.  Psychiatric:        Attention and Perception: She is attentive.        Mood and Affect: Mood normal.        Speech: Speech normal.        Behavior: Behavior normal. Behavior is cooperative.        Thought Content: Thought content normal.        Judgment: Judgment normal.           Assessment & Plan:   Assessment and Plan    Recurrent Lip Swelling Possible angioedema. No known triggers. Recent onset of ankle and foot swelling. -Order C4 and C1 labs to evaluate for hereditary angioedema. -Refer to allergist for further evaluation.  HIV Stable on Dovato with undetectable viral load and CD4 count of 391. -Continue Dovato. -Order routine HIV labs, HIV RNA, CD4 etc  Hyperlipidemia Patient participated in the REPRIEVE study. -Start lipitor to reduce inflammation and cholesterol levels.  General Health Maintenance -Refer to nurse practitioner  for Pap smear (last done in 2019). -Administer flu and tetanus vaccines today. -Recommend COVID-19 vaccination at nearby pharmacy.      Colon cancer screening: referring for colonoscopy

## 2023-07-29 LAB — C. TRACHOMATIS/N. GONORRHOEAE RNA
C. trachomatis RNA, TMA: NOT DETECTED
N. gonorrhoeae RNA, TMA: NOT DETECTED

## 2023-07-31 ENCOUNTER — Other Ambulatory Visit: Payer: Self-pay

## 2023-08-01 LAB — CBC WITH DIFFERENTIAL/PLATELET
Absolute Lymphocytes: 1148 {cells}/uL (ref 850–3900)
Absolute Monocytes: 444 {cells}/uL (ref 200–950)
Basophils Absolute: 31 {cells}/uL (ref 0–200)
Basophils Relative: 0.7 %
Eosinophils Absolute: 48 {cells}/uL (ref 15–500)
Eosinophils Relative: 1.1 %
HCT: 35.7 % (ref 35.0–45.0)
Hemoglobin: 12.2 g/dL (ref 11.7–15.5)
MCH: 33.7 pg — ABNORMAL HIGH (ref 27.0–33.0)
MCHC: 34.2 g/dL (ref 32.0–36.0)
MCV: 98.6 fL (ref 80.0–100.0)
MPV: 10.4 fL (ref 7.5–12.5)
Monocytes Relative: 10.1 %
Neutro Abs: 2728 {cells}/uL (ref 1500–7800)
Neutrophils Relative %: 62 %
Platelets: 197 10*3/uL (ref 140–400)
RBC: 3.62 10*6/uL — ABNORMAL LOW (ref 3.80–5.10)
RDW: 12.7 % (ref 11.0–15.0)
Total Lymphocyte: 26.1 %
WBC: 4.4 10*3/uL (ref 3.8–10.8)

## 2023-08-01 LAB — COMPLETE METABOLIC PANEL WITH GFR
AG Ratio: 1.5 (calc) (ref 1.0–2.5)
ALT: 18 U/L (ref 6–29)
AST: 23 U/L (ref 10–35)
Albumin: 4.3 g/dL (ref 3.6–5.1)
Alkaline phosphatase (APISO): 75 U/L (ref 37–153)
BUN: 7 mg/dL (ref 7–25)
CO2: 26 mmol/L (ref 20–32)
Calcium: 9.4 mg/dL (ref 8.6–10.4)
Chloride: 106 mmol/L (ref 98–110)
Creat: 0.83 mg/dL (ref 0.50–1.05)
Globulin: 2.8 g/dL (ref 1.9–3.7)
Glucose, Bld: 108 mg/dL — ABNORMAL HIGH (ref 65–99)
Potassium: 4 mmol/L (ref 3.5–5.3)
Sodium: 142 mmol/L (ref 135–146)
Total Bilirubin: 0.6 mg/dL (ref 0.2–1.2)
Total Protein: 7.1 g/dL (ref 6.1–8.1)
eGFR: 80 mL/min/{1.73_m2} (ref >=60)

## 2023-08-01 LAB — C3 AND C4
C3 Complement: 137 mg/dL (ref 83–193)
C4 Complement: 43 mg/dL (ref 15–57)

## 2023-08-01 LAB — HIV RNA, RTPCR W/R GT (RTI, PI,INT)
HIV 1 RNA Quant: 51 {copies}/mL — ABNORMAL HIGH
HIV-1 RNA Quant, Log: 1.71 {Log} — ABNORMAL HIGH

## 2023-08-01 LAB — T-HELPER CELLS (CD4) COUNT (NOT AT ARMC)
Absolute CD4: 604 {cells}/uL (ref 490–1740)
CD4 T Helper %: 47 % (ref 30–61)
Total lymphocyte count: 1297 {cells}/uL (ref 850–3900)

## 2023-08-01 LAB — RPR: RPR Ser Ql: NONREACTIVE

## 2023-08-01 LAB — C1 ESTERASE INHIBITOR, FUNCTIONAL: C1 Esterase Inhibitor Funct: >100 % (ref >=68)

## 2023-08-01 LAB — HEPATITIS B SURFACE ANTIBODY, QUANTITATIVE: Hep B S AB Quant (Post): 262 m[IU]/mL (ref >=10)

## 2023-08-06 ENCOUNTER — Other Ambulatory Visit: Payer: Self-pay

## 2023-08-08 ENCOUNTER — Other Ambulatory Visit: Payer: Self-pay

## 2023-08-08 NOTE — Progress Notes (Signed)
 Specialty Pharmacy Refill Coordination Note  Barbara Henry is a 63 y.o. female contacted today regarding refills of specialty medication(s) Dolutegravir-lamiVUDine (Dovato)   Patient requested Delivery   Delivery date: 08/13/23   Verified address: [address] [place] [address]   Medication will be filled on 03.11.25.

## 2023-08-27 ENCOUNTER — Encounter: Payer: Self-pay | Admitting: Gastroenterology

## 2023-09-03 ENCOUNTER — Other Ambulatory Visit: Payer: Self-pay

## 2023-09-08 ENCOUNTER — Other Ambulatory Visit: Payer: Self-pay

## 2023-09-08 ENCOUNTER — Other Ambulatory Visit (HOSPITAL_COMMUNITY): Payer: Self-pay

## 2023-09-08 NOTE — Progress Notes (Signed)
 Specialty Pharmacy Refill Coordination Note  Barbara Henry is a 63 y.o. female contacted today regarding refills of specialty medication(s) Dolutegravir-lamiVUDine (Dovato)   Patient requested Delivery   Delivery date: 09/09/23   Verified address: [address]   [place] [address]   Medication will be filled on 09/08/23.

## 2023-09-24 ENCOUNTER — Ambulatory Visit (AMBULATORY_SURGERY_CENTER)

## 2023-09-24 ENCOUNTER — Other Ambulatory Visit: Payer: Self-pay

## 2023-09-24 ENCOUNTER — Other Ambulatory Visit (HOSPITAL_COMMUNITY): Payer: Self-pay

## 2023-09-24 VITALS — Ht 67.0 in | Wt 150.0 lb

## 2023-09-24 DIAGNOSIS — Z1211 Encounter for screening for malignant neoplasm of colon: Secondary | ICD-10-CM

## 2023-09-24 MED ORDER — PEG 3350-KCL-NA BICARB-NACL 420 G PO SOLR
4000.0000 mL | Freq: Once | ORAL | 0 refills | Status: DC
  Filled 2023-09-24: qty 4000, 1d supply, fill #0

## 2023-09-24 NOTE — Progress Notes (Signed)

## 2023-09-26 ENCOUNTER — Other Ambulatory Visit: Payer: Self-pay

## 2023-09-26 ENCOUNTER — Encounter: Payer: Self-pay | Admitting: Allergy & Immunology

## 2023-09-26 ENCOUNTER — Ambulatory Visit: Admitting: Allergy & Immunology

## 2023-09-26 ENCOUNTER — Other Ambulatory Visit (HOSPITAL_COMMUNITY): Payer: Self-pay

## 2023-09-26 VITALS — BP 128/80 | HR 81 | Temp 97.3°F | Resp 18 | Ht 65.0 in | Wt 169.2 lb

## 2023-09-26 DIAGNOSIS — R221 Localized swelling, mass and lump, neck: Secondary | ICD-10-CM

## 2023-09-26 DIAGNOSIS — T782XXD Anaphylactic shock, unspecified, subsequent encounter: Secondary | ICD-10-CM | POA: Diagnosis not present

## 2023-09-26 DIAGNOSIS — G43809 Other migraine, not intractable, without status migrainosus: Secondary | ICD-10-CM

## 2023-09-26 DIAGNOSIS — R220 Localized swelling, mass and lump, head: Secondary | ICD-10-CM | POA: Diagnosis not present

## 2023-09-26 DIAGNOSIS — B20 Human immunodeficiency virus [HIV] disease: Secondary | ICD-10-CM

## 2023-09-26 DIAGNOSIS — E785 Hyperlipidemia, unspecified: Secondary | ICD-10-CM

## 2023-09-26 MED ORDER — CETIRIZINE HCL 10 MG PO TABS
10.0000 mg | ORAL_TABLET | Freq: Every day | ORAL | 0 refills | Status: AC
  Filled 2023-09-26: qty 30, 30d supply, fill #0

## 2023-09-26 MED ORDER — FAMOTIDINE 20 MG PO TABS
20.0000 mg | ORAL_TABLET | Freq: Two times a day (BID) | ORAL | 0 refills | Status: AC
  Filled 2023-09-26: qty 60, 30d supply, fill #0

## 2023-09-26 NOTE — Patient Instructions (Addendum)
 1. Anaphylaxis with lip swelling - We are going to get some labs to look for weird causes of swelling and allergic reactions. - We are also going to get an environmental allergy panel to look for anything that might have caused this reaction. - I am going not going to do testing to the most common foods since you seem to tolerate them all without a problem (peanut, sesame, cashew, soy, fish mix, shellfish mix, wheat, milk, casein, egg) - We are going to hold off on an EpiPen for now. - Start cetirizine  10mg  twice daily. - Start famotidine  20mg  twice daily. - These might help to get the swelling under control.  2. Return in about 4 weeks (around 10/24/2023). You can have the follow up appointment with Dr. Idolina Maker or a Nurse Practicioner (our Nurse Practitioners are excellent and always have Physician oversight!).    Please inform us  of any Emergency Department visits, hospitalizations, or changes in symptoms. Call us  before going to the ED for breathing or allergy symptoms since we might be able to fit you in for a sick visit. Feel free to contact us  anytime with any questions, problems, or concerns.  It was a pleasure to meet you today!  Websites that have reliable patient information: 1. American Academy of Asthma, Allergy, and Immunology: www.aaaai.org 2. Food Allergy Research and Education (FARE): foodallergy.org 3. Mothers of Asthmatics: http://www.asthmacommunitynetwork.org 4. American College of Allergy, Asthma, and Immunology: www.acaai.org      "Like" us  on Facebook and Instagram for our latest updates!      A healthy democracy works best when [employer] participate! Make sure you are registered to vote! If you have moved or changed any of your contact information, you will need to get this updated before voting! Scan the QR codes below to learn more!

## 2023-09-26 NOTE — Progress Notes (Addendum)
 NEW PATIENT  Date of Service/Encounter:  09/26/23  Consult requested by: Barbara Henry, Barbara Money, MD   Assessment:   Anaphylaxis  Swelling of lip, tongue, and throat  HIV - on HAART therapy (Dovato )  Hyperlipidemia  History of hepatitis C - s/p antiviral treatment 10+ years ago  Plan/Recommendations:   1. Anaphylaxis with lip swelling - We are going to get some labs to look for weird causes of swelling and allergic reactions. - We are also going to get an environmental allergy panel to look for anything that might have caused this reaction. - I am going not going to do testing to the most common foods since you seem to tolerate them all without a problem (peanut, sesame, cashew, soy, fish mix, shellfish mix, wheat, milk, casein, egg) - We are going to hold off on an EpiPen for now. - Start cetirizine  10mg  twice daily. - Start famotidine  20mg  twice daily. - These might help to get the swelling under control.  2. Return in about 4 weeks (around 10/24/2023). You can have the follow up appointment with Barbara Henry or a Nurse Practicioner (our Nurse Practitioners are excellent and always have Physician oversight!).     This note in its entirety was forwarded to the Provider who requested this consultation.  Subjective:   Barbara Henry is a 63 y.o. female presenting today for evaluation of  Chief Complaint  Patient presents with   Allergic Rhinitis     Itchy/ burning face - reaction to the sun - took tylenol     Urticaria    During the summer breaks out    Sinus Problem    Barbara Henry has a history of the following: Patient Active Problem List   Diagnosis Date Noted   HIV disease (HCC) 07/27/2023   Cervical cancer screening 07/27/2023   Colon cancer screening 07/27/2023   Seasonal allergies 01/21/2023   Lip swelling 01/21/2023   Synovitis of left knee 08/22/2022   Muscle cramp 01/22/2022   Excess ear wax 01/22/2022   Migraine headache 01/22/2022   Vaccine  counseling 01/21/2022   Cramps of left lower extremity 02/22/2020   Healthcare maintenance 02/22/2020   Gout 05/15/2018   Bilateral lower extremity edema 12/30/2017   HTN, white coat 02/01/2015   Headache 07/16/2011   Hyperlipidemia 10/04/2009   Human immunodeficiency virus (HIV) disease (HCC) 11/14/2008   HEPATITIS C 11/14/2008    History obtained from: chart review and patient.  Discussed the use of AI scribe software for clinical note transcription with the patient and/or guardian, who gave verbal consent to proceed.  Barbara Henry was referred by Barbara Henry, Barbara Money, MD.     Barbara Henry is a 63 y.o. female presenting for an evaluation of angioedema .  Facial swelling and itching began last Friday while she was outside talking to her brother. She describes an incident where 'something just hit my face,' which she brushed off, leading to swelling and itching later that night. By the next morning, she was unable to open her left eye, and her mouth was swollen. Her throat also became 'cracked and sore,' making it difficult to swallow.  She did not seek emergency care but attempted self-treatment with peroxide and cold alcohol, which did not alleviate her symptoms. She also took Tylenol  and ibuprofen and stayed indoors. Despite these measures, her mouth remains swollen, particularly her lip, and the swelling has persisted for about a week. It has slowly improved.   No new food intake or  medication prior to the onset of symptoms. No insect stings. She applied cocoa butter to the affected areas to manage itching. She reports a sensation of throat tightness on the left side, describing it as 'trying to squeeze in.' She has never had this in the past. She is not on an ACEI medications and has never bene on a blood pressure medication. She has not used an EpiPen before and does not currently have one.  Allergic Rhinitis Symptom History: She has not been tested for environmental allergies, although  she experiences symptoms such as sneezing and itchy, watery eyes.   Infection Symptom History: She does have HIV and is on Dovato . She sees Dr. Ernie Henry. Her last appointment was in February 2025. Her past medical history includes HIV, which is under control.  She is currently on the.  Underwent cell count was 6042 months ago.  Her HIV was diagnosed in 2024 or so.  This has been under good control since that time.  She receives regular visits with infectious disease.  She has never had any complications from the HIV.    She grew up in [place] and is now retired, having last worked in 2017 or 2018.  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 She has children, grandchildren, and great-nieces and nephews. She does not have any pets.   Otherwise, there is no history of other atopic diseases, including drug allergies, stinging insect allergies, or contact dermatitis. There is no significant infectious history. Vaccinations are up to date.    Past Medical History: Patient Active Problem List   Diagnosis Date Noted   HIV disease (HCC) 07/27/2023   Cervical cancer screening 07/27/2023   Colon cancer screening 07/27/2023   Seasonal allergies 01/21/2023   Lip swelling 01/21/2023   Synovitis of left knee 08/22/2022   Muscle cramp 01/22/2022   Excess ear wax 01/22/2022   Migraine headache 01/22/2022   Vaccine counseling 01/21/2022   Cramps of left lower extremity 02/22/2020   Healthcare maintenance 02/22/2020   Gout 05/15/2018   Bilateral lower extremity edema 12/30/2017   HTN, white coat 02/01/2015   Headache 07/16/2011   Hyperlipidemia 10/04/2009   Human immunodeficiency virus (HIV) disease (HCC) 11/14/2008   HEPATITIS C 11/14/2008    Medication List:  Allergies as of 09/26/2023   No Known Allergies      Medication List        Accurate as of September 26, 2023 11:39 AM. If you have any questions, ask your nurse or doctor.           STOP taking these medications    atorvastatin  20 MG tablet Commonly known as: Lipitor Stopped by: Barbara Henry   meloxicam  15 MG tablet Commonly known as: MOBIC  Stopped by: Rochester Chuck   polyethylene glycol-electrolytes 420 g solution Commonly known as: NuLYTELY Stopped by: Rochester Chuck       TAKE these medications    aspirin 325 MG tablet Take 325 mg by mouth once.   cetirizine  10 MG tablet Commonly known as: ZYRTEC  Take 1 tablet (10 mg total) by mouth daily. Started by: Rochester Chuck   Dovato  50-300 MG tablet Generic drug: dolutegravir -lamiVUDine  Take 1 tablet by mouth daily.   famotidine  20 MG tablet Commonly known as: PEPCID  Take 1 tablet (20 mg total) by mouth 2 (two) times daily. Started by: Rochester Chuck        Birth History: non-contributory  Developmental History: non-contributory  Past Surgical History: History reviewed. No pertinent surgical history.   Family History: Family History  Problem Relation Age of Onset   Colon polyps Mother    Colon cancer Mother    Diabetes Sister    Diabetes Brother    Breast cancer Maternal Aunt 4   Breast cancer Maternal Aunt 50   Esophageal cancer Neg Hx    Rectal cancer Neg Hx    Stomach cancer Neg Hx      Social History: Barbara Henry lives at home in a house that is 61 years old.  There is electric heating and window units for cooling.  There are no animals inside or outside of the home.  There is no tobacco exposure.  She does have dust mite covers on her pillow, but not her bed.  There is no fume, chemical, or dust exposure.  There is no HEPA filter in the home.  She does not live near an interstate or industrial area.   Review of systems otherwise negative other than that mentioned in the HPI.    Objective:   Blood pressure 128/80, pulse 81, temperature (!) 97.3 F (36.3 C), temperature source Temporal, resp. rate 18, height 5\' 5"  (1.651 m), weight 169 lb 3.2  oz (76.7 kg), last menstrual period 03/15/2013, SpO2 98%. Body mass index is 28.16 kg/m.     Physical Exam Constitutional:      Appearance: She is well-developed.  HENT:     Head: Normocephalic and atraumatic.     Right Ear: Tympanic membrane, ear canal and external ear normal. No drainage, swelling or tenderness. Tympanic membrane is not injected, scarred, erythematous, retracted or bulging.     Left Ear: Tympanic membrane, ear canal and external ear normal. No drainage, swelling or tenderness. Tympanic membrane is not injected, scarred, erythematous, retracted or bulging.     Nose: No nasal deformity, septal deviation, mucosal edema or rhinorrhea.     Right Sinus: No maxillary sinus tenderness or frontal sinus tenderness.     Left Sinus: No maxillary sinus tenderness or frontal sinus tenderness.     Mouth/Throat:     Lips: Pink.     Mouth: Mucous membranes are moist. Mucous membranes are not pale and not dry.     Pharynx: Uvula midline.     Comments: Has had visible swelling on the upper lip, more on the left side impression of the right side. Eyes:     General:        Right eye: No discharge.        Left eye: No discharge.     Conjunctiva/sclera: Conjunctivae normal.     Right eye: Right conjunctiva is not injected. No chemosis.    Left eye: Left conjunctiva is not injected. No chemosis.    Pupils: Pupils are equal, round, and reactive to light.  Cardiovascular:     Rate and Rhythm: Normal rate and regular rhythm.     Heart sounds: Normal heart sounds.  Pulmonary:     Effort: Pulmonary effort is normal. No tachypnea, accessory muscle usage or respiratory distress.     Breath sounds: Normal breath sounds. No wheezing, rhonchi or rales.  Chest:     Chest wall: No tenderness.  Abdominal:     Tenderness: There is no abdominal tenderness. There is no guarding or rebound.  Lymphadenopathy:     Head:     Right side of head: No submandibular, tonsillar or occipital adenopathy.      Left side  of head: No submandibular, tonsillar or occipital adenopathy.     Cervical: No cervical adenopathy.  Skin:    General: Skin is warm.     Capillary Refill: Capillary refill takes less than 2 seconds.     Coloration: Skin is not pale.     Findings: No abrasion, erythema, petechiae or rash. Rash is not papular, urticarial or vesicular.     Comments: No urticaria appreciated.  Neurological:     Mental Status: She is alert.      Diagnostic studies: labs sent instead          Drexel Gentles, MD [facility] 

## 2023-09-29 ENCOUNTER — Other Ambulatory Visit (HOSPITAL_COMMUNITY): Payer: Self-pay

## 2023-09-29 ENCOUNTER — Other Ambulatory Visit: Payer: Self-pay

## 2023-09-29 NOTE — Progress Notes (Signed)
 Specialty Pharmacy Refill Coordination Note  Barbara Henry is a 63 y.o. female contacted today regarding refills of specialty medication(s) Dolutegravir -lamiVUDine  (Dovato )   Patient requested Delivery   Delivery date: 10/02/23   Verified address: [address]   [place] [address]   Medication will be filled on 10/01/23.

## 2023-10-01 ENCOUNTER — Other Ambulatory Visit: Payer: Self-pay

## 2023-10-16 ENCOUNTER — Telehealth: Payer: Self-pay | Admitting: Internal Medicine

## 2023-10-16 NOTE — Telephone Encounter (Signed)
 Good afternoon Dr. Rosaline Coma, I received a call from this patient stating she was cancelling her procedure due to her living in [place] and is needing something closer to home. Patient states that she will contact her PCP to send a referral for a gastroenterologist in [place]. Please advise.

## 2023-10-16 NOTE — Telephone Encounter (Signed)
 Message sent to Dr. Rosaline Coma in error, appointment was with Dr. Nandigam for 5/21.

## 2023-10-17 NOTE — Telephone Encounter (Signed)
 ok

## 2023-10-21 ENCOUNTER — Other Ambulatory Visit: Payer: Self-pay

## 2023-10-22 ENCOUNTER — Encounter: Admitting: Gastroenterology

## 2023-10-23 ENCOUNTER — Other Ambulatory Visit: Payer: Self-pay

## 2023-10-23 ENCOUNTER — Other Ambulatory Visit: Payer: Self-pay | Admitting: Pharmacy Technician

## 2023-10-23 NOTE — Progress Notes (Signed)
 Specialty Pharmacy Refill Coordination Note  Barbara Henry is a 63 y.o. female contacted today regarding refills of specialty medication(s) Dolutegravir -lamiVUDine  (Dovato )   Patient requested Delivery   Delivery date: 10/29/23   Verified address: [address] [place]   Medication will be filled on 10/28/23.

## 2023-10-24 NOTE — Progress Notes (Signed)
 The 10-year ASCVD risk score (Arnett DK, et al., 2019) is: 4.9%   Values used to calculate the score:     Age: 63 years     Sex: Female     Is Non-Hispanic African American: Yes     Diabetic: No     Tobacco smoker: No     Systolic Blood Pressure: 128 mmHg     Is BP treated: No     HDL Cholesterol: 86 mg/dL     Total Cholesterol: 219 mg/dL  ASCVD <1%.  Barbara Henry, BSN, RN

## 2023-10-28 ENCOUNTER — Other Ambulatory Visit: Payer: Self-pay

## 2023-10-30 ENCOUNTER — Other Ambulatory Visit: Payer: Self-pay

## 2023-11-05 ENCOUNTER — Ambulatory Visit: Admitting: Allergy & Immunology

## 2023-11-27 ENCOUNTER — Other Ambulatory Visit: Payer: Self-pay

## 2023-12-01 ENCOUNTER — Other Ambulatory Visit: Payer: Self-pay

## 2023-12-02 ENCOUNTER — Other Ambulatory Visit: Payer: Self-pay

## 2023-12-03 ENCOUNTER — Encounter: Admitting: Gastroenterology

## 2023-12-03 ENCOUNTER — Telehealth: Payer: Self-pay | Admitting: Gastroenterology

## 2023-12-03 ENCOUNTER — Other Ambulatory Visit: Payer: Self-pay

## 2023-12-03 NOTE — Telephone Encounter (Signed)
 Good Afternoon Dr. Shila,  I called this patient at 1:10pm today to see if she was coming for her procedure, I left a message that if she was running late or needed to reschedule to please call us  back  I will NO SHOW her.  Medicaid

## 2023-12-08 ENCOUNTER — Other Ambulatory Visit: Payer: Self-pay

## 2023-12-15 ENCOUNTER — Other Ambulatory Visit (HOSPITAL_COMMUNITY): Payer: Self-pay

## 2024-01-25 NOTE — Progress Notes (Unsigned)
  Chief complaint: follow-up for HIV disease on medications  Subjective:    Patient ID: Barbara Henry, female    DOB: 1960/07/25, 63 y.o.   MRN: [id number]  HPI  Past Medical History:  Diagnosis Date   Anemia    Angio-edema    Cervical cancer screening 07/27/2023   Colon cancer screening 07/27/2023   Excess ear wax 01/22/2022   Gout 05/15/2018   Gout attack 02/22/2016   Gout flare 11/27/2021   Hepatitis C without hepatic coma    History of hepatitis C 02/01/2015   HIV (human immunodeficiency virus infection) (HCC)    HIV disease (HCC) 07/27/2023   Hyperlipidemia    Lip swelling 11/27/2021   Lip swelling 01/21/2023   Low back pain 07/27/2019   Migraine headache 01/22/2022   Muscle cramp 01/22/2022   Muscle spasm 01/10/2020   Recurrent upper respiratory infection (URI)    Seasonal allergies 01/21/2023   Shingles    Urticaria    Vaccine counseling 01/21/2022    No past surgical history on file.  Family History  Problem Relation Age of Onset   Colon polyps Mother    Colon cancer Mother    Diabetes Sister    Diabetes Brother    Breast cancer Maternal Aunt 48   Breast cancer Maternal Aunt 50   Esophageal cancer Neg Hx    Rectal cancer Neg Hx    Stomach cancer Neg Hx       Social History   Socioeconomic History   Marital status: Single    Spouse name: Not on file   Number of children: 4   Years of education: Not on file   Highest education level: 12th grade  Occupational History   Not on file  Tobacco Use   Smoking status: Never    Passive exposure: Never   Smokeless tobacco: Never  Vaping Use   Vaping status: Never Used  Substance and Sexual Activity   Alcohol use: Not Currently    Alcohol/week: 2.0 standard drinks of alcohol    Types: 2 Standard drinks or equivalent per week    Comment: beer   Drug use: No   Sexual activity: Yes    Partners: Male    Birth control/protection: Condom    Comment: declined condoms  Other Topics Concern   Not on  file  Social History Narrative   Not on file   Social Drivers of Health   Financial Resource Strain: Not on file  Food Insecurity: Not on file  Transportation Needs: Unmet Transportation Needs (11/10/2018)   PRAPARE - Administrator, Civil Service (Medical): Yes    Lack of Transportation (Non-Medical): Yes  Physical Activity: Not on file  Stress: Not on file  Social Connections: Not on file    No Known Allergies   Current Outpatient Medications:    aspirin 325 MG tablet, Take 325 mg by mouth once., Disp: , Rfl:    cetirizine  (ZYRTEC ) 10 MG tablet, Take 1 tablet (10 mg total) by mouth daily., Disp: 30 tablet, Rfl: 0   dolutegravir -lamiVUDine  (DOVATO ) 50-300 MG tablet, Take 1 tablet by mouth daily., Disp: 30 tablet, Rfl: 11   famotidine  (PEPCID ) 20 MG tablet, Take 1 tablet (20 mg total) by mouth 2 (two) times daily., Disp: 60 tablet, Rfl: 0   Review of Systems     Objective:   Physical Exam        Assessment & Plan:

## 2024-01-26 ENCOUNTER — Ambulatory Visit (INDEPENDENT_AMBULATORY_CARE_PROVIDER_SITE_OTHER): Payer: Medicaid Other | Admitting: Infectious Disease

## 2024-01-26 ENCOUNTER — Encounter: Payer: Self-pay | Admitting: Infectious Disease

## 2024-01-26 ENCOUNTER — Other Ambulatory Visit: Payer: Self-pay

## 2024-01-26 ENCOUNTER — Other Ambulatory Visit (HOSPITAL_COMMUNITY): Payer: Self-pay

## 2024-01-26 VITALS — BP 146/93 | HR 73 | Temp 98.5°F | Wt 160.0 lb

## 2024-01-26 DIAGNOSIS — E785 Hyperlipidemia, unspecified: Secondary | ICD-10-CM | POA: Diagnosis not present

## 2024-01-26 DIAGNOSIS — Z8719 Personal history of other diseases of the digestive system: Secondary | ICD-10-CM | POA: Diagnosis not present

## 2024-01-26 DIAGNOSIS — R220 Localized swelling, mass and lump, head: Secondary | ICD-10-CM

## 2024-01-26 DIAGNOSIS — Z23 Encounter for immunization: Secondary | ICD-10-CM | POA: Diagnosis present

## 2024-01-26 DIAGNOSIS — Z7185 Encounter for immunization safety counseling: Secondary | ICD-10-CM

## 2024-01-26 DIAGNOSIS — Z1211 Encounter for screening for malignant neoplasm of colon: Secondary | ICD-10-CM

## 2024-01-26 DIAGNOSIS — B20 Human immunodeficiency virus [HIV] disease: Secondary | ICD-10-CM | POA: Diagnosis present

## 2024-01-26 MED ORDER — ATORVASTATIN CALCIUM 20 MG PO TABS
20.0000 mg | ORAL_TABLET | Freq: Every day | ORAL | 11 refills | Status: AC
  Filled 2024-01-26: qty 30, 30d supply, fill #0

## 2024-01-26 MED ORDER — DOVATO 50-300 MG PO TABS
1.0000 | ORAL_TABLET | Freq: Every day | ORAL | 11 refills | Status: AC
  Filled 2024-01-26 – 2024-05-07 (×2): qty 30, 30d supply, fill #0
  Filled 2024-05-25 – 2024-05-31 (×3): qty 30, 30d supply, fill #1
  Filled 2024-07-01: qty 30, 30d supply, fill #2

## 2024-01-28 LAB — LIPID PANEL
Cholesterol: 165 mg/dL (ref <200)
HDL: 92 mg/dL (ref >=50)
LDL Cholesterol (Calc): 57 mg/dL
Non-HDL Cholesterol (Calc): 73 mg/dL (ref <130)
Total CHOL/HDL Ratio: 1.8 (calc) (ref <5.0)
Triglycerides: 77 mg/dL (ref <150)

## 2024-01-28 LAB — COMPLETE METABOLIC PANEL WITHOUT GFR
AG Ratio: 1 (calc) (ref 1.0–2.5)
ALT: 25 U/L (ref 6–29)
AST: 45 U/L — ABNORMAL HIGH (ref 10–35)
Albumin: 4 g/dL (ref 3.6–5.1)
Alkaline phosphatase (APISO): 99 U/L (ref 37–153)
BUN/Creatinine Ratio: 7 (calc) (ref 6–22)
BUN: 4 mg/dL — ABNORMAL LOW (ref 7–25)
CO2: 22 mmol/L (ref 20–32)
Calcium: 9.5 mg/dL (ref 8.6–10.4)
Chloride: 103 mmol/L (ref 98–110)
Creat: 0.57 mg/dL (ref 0.50–1.05)
Globulin: 4.1 g/dL — ABNORMAL HIGH (ref 1.9–3.7)
Glucose, Bld: 85 mg/dL (ref 65–99)
Potassium: 3.9 mmol/L (ref 3.5–5.3)
Sodium: 135 mmol/L (ref 135–146)
Total Bilirubin: 0.8 mg/dL (ref 0.2–1.2)
Total Protein: 8.1 g/dL (ref 6.1–8.1)

## 2024-01-28 LAB — T-HELPER CELLS (CD4) COUNT (NOT AT ARMC)
CD4 % Helper T Cell: 31 % — ABNORMAL LOW (ref 33–65)
CD4 T Cell Abs: 343 /uL — ABNORMAL LOW (ref 400–1790)

## 2024-01-28 LAB — CBC WITH DIFFERENTIAL/PLATELET
Absolute Lymphocytes: 1256 {cells}/uL (ref 850–3900)
Absolute Monocytes: 271 {cells}/uL (ref 200–950)
Basophils Absolute: 11 {cells}/uL (ref 0–200)
Basophils Relative: 0.5 %
Eosinophils Absolute: 31 {cells}/uL (ref 15–500)
Eosinophils Relative: 1.4 %
HCT: 43.8 % (ref 35.0–45.0)
Hemoglobin: 14.3 g/dL (ref 11.7–15.5)
MCH: 32.1 pg (ref 27.0–33.0)
MCHC: 32.6 g/dL (ref 32.0–36.0)
MCV: 98.4 fL (ref 80.0–100.0)
MPV: 11.3 fL (ref 7.5–12.5)
Monocytes Relative: 12.3 %
Neutro Abs: 631 {cells}/uL — ABNORMAL LOW (ref 1500–7800)
Neutrophils Relative %: 28.7 %
Platelets: 130 Thousand/uL — ABNORMAL LOW (ref 140–400)
RBC: 4.45 Million/uL (ref 3.80–5.10)
RDW: 14.4 % (ref 11.0–15.0)
Total Lymphocyte: 57.1 %
WBC: 2.2 Thousand/uL — ABNORMAL LOW (ref 3.8–10.8)

## 2024-01-28 LAB — RPR: RPR Ser Ql: NONREACTIVE

## 2024-01-28 LAB — HIV-1 RNA QUANT-NO REFLEX-BLD
HIV 1 RNA Quant: 69500 {copies}/mL — ABNORMAL HIGH
HIV-1 RNA Quant, Log: 4.84 {Log_copies}/mL — ABNORMAL HIGH

## 2024-05-07 ENCOUNTER — Other Ambulatory Visit: Payer: Self-pay

## 2024-05-07 ENCOUNTER — Other Ambulatory Visit (HOSPITAL_COMMUNITY): Payer: Self-pay

## 2024-05-07 NOTE — Progress Notes (Signed)
 Specialty Pharmacy Ongoing Clinical Assessment Note  Barbara Henry is a 63 y.o. female who is being followed by the specialty pharmacy service for RxSp HIV   Patient's specialty medication(s) reviewed today: Dolutegravir -lamiVUDine  (Dovato )   Missed doses in the last 4 weeks: More than 5   Patient/Caregiver did not have any additional questions or concerns.   Therapeutic benefit summary: Patient is NOT achieving benefit   Adverse events/side effects summary: No adverse events/side effects   Patient's therapy is appropriate to: Continue    Goals Addressed             This Visit's Progress    Achieve Undetectable HIV Viral Load < 20   Worsening    Patient is not on track and worsening. Patient will maintain adherence. Patient's viral load increased to 69,500 copies/mL, up from 51 copies/mL in February.          Follow up: 3 months - changing back to 3 month clinicals due to concerns with adherence and increase in viral load.  [facility] Specialty Pharmacist

## 2024-05-07 NOTE — Progress Notes (Signed)
 Clinical Intervention Note  Clinical Intervention Notes: Patient reported that she has not had a refill since October, although records show that we have not filled since May. Patient also stated that she has been taking daily. Reemphazied importance of taking medication every day, confirmed patient's phone number, and encouraged patient to be on the lookout for our call in January for her next fill. Patient was understanding.   No data recorded  [facility] Specialty Pharmacist

## 2024-05-07 NOTE — Progress Notes (Signed)
 Specialty Pharmacy Refill Coordination Note  Barbara Henry is a 64 y.o. female contacted today regarding refills of specialty medication(s) Dolutegravir -lamiVUDine  (Dovato )   Patient requested Delivery   Delivery date: 05/10/24   Verified address: [address] [place]   Medication will be filled on: 05/07/24

## 2024-05-25 ENCOUNTER — Other Ambulatory Visit: Payer: Self-pay

## 2024-05-26 ENCOUNTER — Other Ambulatory Visit: Payer: Self-pay

## 2024-05-31 ENCOUNTER — Other Ambulatory Visit (HOSPITAL_COMMUNITY): Payer: Self-pay

## 2024-06-02 ENCOUNTER — Other Ambulatory Visit: Payer: Self-pay

## 2024-06-02 NOTE — Progress Notes (Signed)
 Specialty Pharmacy Refill Coordination Note  Barbara Henry is a 63 y.o. female contacted today regarding refills of specialty medication(s) Dolutegravir -lamiVUDine  (Dovato )   Patient requested Delivery   Delivery date: 06/07/24   Verified address: [address] [place]   Medication will be filled on: 06/04/24

## 2024-06-04 ENCOUNTER — Other Ambulatory Visit: Payer: Self-pay

## 2024-06-24 ENCOUNTER — Other Ambulatory Visit (HOSPITAL_COMMUNITY): Payer: Self-pay

## 2024-07-01 ENCOUNTER — Other Ambulatory Visit: Payer: Self-pay

## 2024-07-05 ENCOUNTER — Other Ambulatory Visit: Payer: Self-pay

## 2024-07-05 NOTE — Progress Notes (Signed)
 Specialty Pharmacy Refill Coordination Note  Barbara Henry is a 64 y.o. female contacted today regarding refills of specialty medication(s) Dolutegravir -lamiVUDine  (Dovato )   Patient requested Delivery   Delivery date: 07/09/24   Verified address: [address] [place]   Medication will be filled on: 07/08/24

## 2024-07-08 ENCOUNTER — Other Ambulatory Visit: Payer: Self-pay

## 2024-07-20 ENCOUNTER — Ambulatory Visit: Admitting: Infectious Disease
